# Patient Record
Sex: Female | Born: 1960 | Race: Black or African American | Hispanic: No | Marital: Married | Smoking: Former smoker
Health system: Southern US, Community
[De-identification: ages and names within clinical notes are randomized; demographics above are authoritative.]

## PROBLEM LIST (undated history)

## (undated) DIAGNOSIS — G2581 Restless legs syndrome: Secondary | ICD-10-CM

## (undated) DIAGNOSIS — E039 Hypothyroidism, unspecified: Secondary | ICD-10-CM

## (undated) DIAGNOSIS — R7301 Impaired fasting glucose: Secondary | ICD-10-CM

## (undated) DIAGNOSIS — D649 Anemia, unspecified: Secondary | ICD-10-CM

## (undated) DIAGNOSIS — M545 Low back pain, unspecified: Secondary | ICD-10-CM

## (undated) DIAGNOSIS — D869 Sarcoidosis, unspecified: Secondary | ICD-10-CM

## (undated) DIAGNOSIS — E669 Obesity, unspecified: Secondary | ICD-10-CM

## (undated) DIAGNOSIS — E559 Vitamin D deficiency, unspecified: Secondary | ICD-10-CM

## (undated) DIAGNOSIS — K219 Gastro-esophageal reflux disease without esophagitis: Secondary | ICD-10-CM

## (undated) DIAGNOSIS — I10 Essential (primary) hypertension: Secondary | ICD-10-CM

## (undated) DIAGNOSIS — J189 Pneumonia, unspecified organism: Secondary | ICD-10-CM

## (undated) DIAGNOSIS — E785 Hyperlipidemia, unspecified: Secondary | ICD-10-CM

## (undated) HISTORY — DX: Obesity, unspecified: E66.9

## (undated) HISTORY — DX: Essential (primary) hypertension: I10

## (undated) HISTORY — DX: Gastro-esophageal reflux disease without esophagitis: K21.9

## (undated) HISTORY — DX: Impaired fasting glucose: R73.01

## (undated) HISTORY — DX: Low back pain, unspecified: M54.50

## (undated) HISTORY — DX: Hyperlipidemia, unspecified: E78.5

## (undated) HISTORY — DX: Vitamin D deficiency, unspecified: E55.9

## (undated) HISTORY — DX: Sarcoidosis, unspecified: D86.9

## (undated) HISTORY — DX: Hypothyroidism, unspecified: E03.9

## (undated) HISTORY — DX: Anemia, unspecified: D64.9

## (undated) HISTORY — DX: Restless legs syndrome: G25.81

## (undated) HISTORY — DX: Low back pain: M54.5

---

## 2000-08-21 ENCOUNTER — Encounter: Admission: RE | Admit: 2000-08-21 | Discharge: 2000-08-21 | Payer: Self-pay | Admitting: Emergency Medicine

## 2000-08-21 ENCOUNTER — Encounter: Payer: Self-pay | Admitting: Emergency Medicine

## 2001-07-05 ENCOUNTER — Encounter: Admission: RE | Admit: 2001-07-05 | Discharge: 2001-07-05 | Payer: Self-pay | Admitting: Emergency Medicine

## 2001-07-05 ENCOUNTER — Encounter: Payer: Self-pay | Admitting: Emergency Medicine

## 2001-09-10 ENCOUNTER — Emergency Department (HOSPITAL_COMMUNITY): Admission: EM | Admit: 2001-09-10 | Discharge: 2001-09-10 | Payer: Self-pay | Admitting: Emergency Medicine

## 2001-09-21 ENCOUNTER — Encounter: Payer: Self-pay | Admitting: Emergency Medicine

## 2001-09-21 ENCOUNTER — Encounter: Admission: RE | Admit: 2001-09-21 | Discharge: 2001-09-21 | Payer: Self-pay | Admitting: Emergency Medicine

## 2002-10-14 ENCOUNTER — Encounter: Admission: RE | Admit: 2002-10-14 | Discharge: 2002-10-14 | Payer: Self-pay | Admitting: Emergency Medicine

## 2002-10-14 ENCOUNTER — Encounter: Payer: Self-pay | Admitting: Emergency Medicine

## 2003-11-23 ENCOUNTER — Encounter: Admission: RE | Admit: 2003-11-23 | Discharge: 2003-11-23 | Payer: Self-pay | Admitting: Emergency Medicine

## 2003-12-27 ENCOUNTER — Encounter: Admission: RE | Admit: 2003-12-27 | Discharge: 2003-12-27 | Payer: Self-pay | Admitting: Family Medicine

## 2004-12-27 ENCOUNTER — Encounter: Admission: RE | Admit: 2004-12-27 | Discharge: 2004-12-27 | Payer: Self-pay | Admitting: Family Medicine

## 2005-02-07 ENCOUNTER — Other Ambulatory Visit: Admission: RE | Admit: 2005-02-07 | Discharge: 2005-02-07 | Payer: Self-pay | Admitting: Family Medicine

## 2005-02-24 ENCOUNTER — Encounter: Admission: RE | Admit: 2005-02-24 | Discharge: 2005-02-24 | Payer: Self-pay | Admitting: Pediatrics

## 2005-03-20 HISTORY — PX: ABDOMINAL HYSTERECTOMY: SHX81

## 2005-03-20 HISTORY — PX: OTHER SURGICAL HISTORY: SHX169

## 2005-04-16 ENCOUNTER — Encounter (INDEPENDENT_AMBULATORY_CARE_PROVIDER_SITE_OTHER): Payer: Self-pay | Admitting: *Deleted

## 2005-04-16 ENCOUNTER — Observation Stay (HOSPITAL_COMMUNITY): Admission: RE | Admit: 2005-04-16 | Discharge: 2005-04-17 | Payer: Self-pay | Admitting: *Deleted

## 2005-04-30 ENCOUNTER — Ambulatory Visit (HOSPITAL_COMMUNITY): Admission: RE | Admit: 2005-04-30 | Discharge: 2005-04-30 | Payer: Self-pay | Admitting: *Deleted

## 2005-05-12 ENCOUNTER — Ambulatory Visit (HOSPITAL_COMMUNITY): Admission: RE | Admit: 2005-05-12 | Discharge: 2005-05-12 | Payer: Self-pay | Admitting: General Surgery

## 2005-05-23 ENCOUNTER — Encounter (INDEPENDENT_AMBULATORY_CARE_PROVIDER_SITE_OTHER): Payer: Self-pay | Admitting: Specialist

## 2005-05-23 ENCOUNTER — Ambulatory Visit (HOSPITAL_COMMUNITY): Admission: RE | Admit: 2005-05-23 | Discharge: 2005-05-23 | Payer: Self-pay | Admitting: General Surgery

## 2005-09-29 ENCOUNTER — Encounter: Admission: RE | Admit: 2005-09-29 | Discharge: 2005-09-29 | Payer: Self-pay | Admitting: Family Medicine

## 2005-10-06 ENCOUNTER — Encounter: Admission: RE | Admit: 2005-10-06 | Discharge: 2005-10-06 | Payer: Self-pay | Admitting: Internal Medicine

## 2005-11-18 ENCOUNTER — Ambulatory Visit: Payer: Self-pay | Admitting: Internal Medicine

## 2006-01-01 ENCOUNTER — Ambulatory Visit: Payer: Self-pay | Admitting: Internal Medicine

## 2006-02-09 ENCOUNTER — Encounter: Admission: RE | Admit: 2006-02-09 | Discharge: 2006-02-09 | Payer: Self-pay | Admitting: Family Medicine

## 2006-03-30 ENCOUNTER — Ambulatory Visit: Payer: Self-pay | Admitting: Internal Medicine

## 2006-04-27 ENCOUNTER — Ambulatory Visit: Payer: Self-pay | Admitting: Family Medicine

## 2006-05-08 ENCOUNTER — Emergency Department (HOSPITAL_COMMUNITY): Admission: EM | Admit: 2006-05-08 | Discharge: 2006-05-08 | Payer: Self-pay | Admitting: Emergency Medicine

## 2006-05-11 ENCOUNTER — Ambulatory Visit: Payer: Self-pay | Admitting: Internal Medicine

## 2006-05-25 ENCOUNTER — Ambulatory Visit: Payer: Self-pay | Admitting: Internal Medicine

## 2006-05-26 ENCOUNTER — Ambulatory Visit: Payer: Self-pay | Admitting: Family Medicine

## 2006-05-27 ENCOUNTER — Encounter: Admission: RE | Admit: 2006-05-27 | Discharge: 2006-05-27 | Payer: Self-pay | Admitting: Family Medicine

## 2006-05-28 ENCOUNTER — Ambulatory Visit: Payer: Self-pay | Admitting: Family Medicine

## 2006-06-08 ENCOUNTER — Encounter: Admission: RE | Admit: 2006-06-08 | Discharge: 2006-06-08 | Payer: Self-pay | Admitting: Gastroenterology

## 2006-06-29 ENCOUNTER — Ambulatory Visit (HOSPITAL_COMMUNITY): Admission: RE | Admit: 2006-06-29 | Discharge: 2006-06-29 | Payer: Self-pay | Admitting: Gastroenterology

## 2006-07-06 ENCOUNTER — Ambulatory Visit: Payer: Self-pay | Admitting: Pulmonary Disease

## 2006-08-28 ENCOUNTER — Ambulatory Visit: Payer: Self-pay | Admitting: Family Medicine

## 2006-09-14 ENCOUNTER — Ambulatory Visit: Payer: Self-pay | Admitting: Family Medicine

## 2006-10-29 ENCOUNTER — Ambulatory Visit: Payer: Self-pay | Admitting: Family Medicine

## 2006-11-02 ENCOUNTER — Ambulatory Visit: Payer: Self-pay | Admitting: Internal Medicine

## 2006-12-07 ENCOUNTER — Ambulatory Visit: Payer: Self-pay | Admitting: Internal Medicine

## 2006-12-07 LAB — CONVERTED CEMR LAB: Angiotensin 1 Converting Enzyme: 122 units/L — ABNORMAL HIGH (ref 9–67)

## 2006-12-30 ENCOUNTER — Ambulatory Visit: Payer: Self-pay | Admitting: Family Medicine

## 2007-01-04 ENCOUNTER — Encounter: Admission: RE | Admit: 2007-01-04 | Discharge: 2007-01-04 | Payer: Self-pay | Admitting: Family Medicine

## 2007-01-11 ENCOUNTER — Ambulatory Visit: Payer: Self-pay | Admitting: Internal Medicine

## 2007-01-28 ENCOUNTER — Ambulatory Visit: Payer: Self-pay | Admitting: Family Medicine

## 2007-07-12 ENCOUNTER — Ambulatory Visit: Payer: Self-pay | Admitting: Family Medicine

## 2007-08-30 ENCOUNTER — Ambulatory Visit: Payer: Self-pay | Admitting: Internal Medicine

## 2007-08-30 LAB — CONVERTED CEMR LAB
Angiotensin 1 Converting Enzyme: 189 units/L — ABNORMAL HIGH (ref 9–67)
BUN: 13 mg/dL (ref 6–23)
Basophils Relative: 0.1 % (ref 0.0–1.0)
CO2: 27 meq/L (ref 19–32)
Calcium: 8.9 mg/dL (ref 8.4–10.5)
Chloride: 110 meq/L (ref 96–112)
Eosinophils Absolute: 0.3 10*3/uL (ref 0.0–0.6)
Eosinophils Relative: 3.8 % (ref 0.0–5.0)
GFR calc non Af Amer: 82 mL/min
Glucose, Bld: 93 mg/dL (ref 70–99)
HCT: 39.5 % (ref 36.0–46.0)
Hemoglobin: 13.4 g/dL (ref 12.0–15.0)
Lymphocytes Relative: 22.7 % (ref 12.0–46.0)
MCV: 86.2 fL (ref 78.0–100.0)
Neutro Abs: 5 10*3/uL (ref 1.4–7.7)
Neutrophils Relative %: 69.1 % (ref 43.0–77.0)
Potassium: 4.3 meq/L (ref 3.5–5.1)
Sed Rate: 15 mm/hr (ref 0–25)
WBC: 7.2 10*3/uL (ref 4.5–10.5)

## 2007-09-07 DIAGNOSIS — K219 Gastro-esophageal reflux disease without esophagitis: Secondary | ICD-10-CM | POA: Insufficient documentation

## 2007-09-07 DIAGNOSIS — I10 Essential (primary) hypertension: Secondary | ICD-10-CM | POA: Insufficient documentation

## 2007-09-07 DIAGNOSIS — E039 Hypothyroidism, unspecified: Secondary | ICD-10-CM | POA: Insufficient documentation

## 2007-09-07 DIAGNOSIS — Z862 Personal history of diseases of the blood and blood-forming organs and certain disorders involving the immune mechanism: Secondary | ICD-10-CM | POA: Insufficient documentation

## 2007-09-13 ENCOUNTER — Ambulatory Visit: Payer: Self-pay | Admitting: Family Medicine

## 2007-09-13 ENCOUNTER — Ambulatory Visit: Payer: Self-pay | Admitting: Internal Medicine

## 2007-10-04 ENCOUNTER — Telehealth (INDEPENDENT_AMBULATORY_CARE_PROVIDER_SITE_OTHER): Payer: Self-pay | Admitting: *Deleted

## 2007-11-04 ENCOUNTER — Ambulatory Visit: Payer: Self-pay | Admitting: Family Medicine

## 2007-11-08 ENCOUNTER — Ambulatory Visit: Payer: Self-pay | Admitting: Internal Medicine

## 2008-02-03 ENCOUNTER — Ambulatory Visit: Payer: Self-pay | Admitting: Internal Medicine

## 2008-02-03 DIAGNOSIS — E669 Obesity, unspecified: Secondary | ICD-10-CM | POA: Insufficient documentation

## 2008-02-06 LAB — CONVERTED CEMR LAB: Angiotensin 1 Converting Enzyme: 9 units/L (ref 9–67)

## 2008-02-09 LAB — CONVERTED CEMR LAB: TSH: 0.57 microintl units/mL (ref 0.35–5.50)

## 2008-03-07 ENCOUNTER — Ambulatory Visit: Payer: Self-pay | Admitting: Internal Medicine

## 2008-03-14 ENCOUNTER — Telehealth (INDEPENDENT_AMBULATORY_CARE_PROVIDER_SITE_OTHER): Payer: Self-pay | Admitting: *Deleted

## 2008-03-15 ENCOUNTER — Encounter: Admission: RE | Admit: 2008-03-15 | Discharge: 2008-03-15 | Payer: Self-pay | Admitting: Family Medicine

## 2008-03-15 ENCOUNTER — Ambulatory Visit: Payer: Self-pay | Admitting: Family Medicine

## 2008-03-28 ENCOUNTER — Telehealth (INDEPENDENT_AMBULATORY_CARE_PROVIDER_SITE_OTHER): Payer: Self-pay | Admitting: *Deleted

## 2008-03-29 ENCOUNTER — Ambulatory Visit: Payer: Self-pay | Admitting: Internal Medicine

## 2008-03-30 ENCOUNTER — Telehealth (INDEPENDENT_AMBULATORY_CARE_PROVIDER_SITE_OTHER): Payer: Self-pay | Admitting: *Deleted

## 2008-04-10 ENCOUNTER — Telehealth (INDEPENDENT_AMBULATORY_CARE_PROVIDER_SITE_OTHER): Payer: Self-pay | Admitting: *Deleted

## 2008-05-09 ENCOUNTER — Ambulatory Visit: Payer: Self-pay | Admitting: Family Medicine

## 2008-06-16 ENCOUNTER — Encounter: Payer: Self-pay | Admitting: Internal Medicine

## 2008-06-16 ENCOUNTER — Telehealth (INDEPENDENT_AMBULATORY_CARE_PROVIDER_SITE_OTHER): Payer: Self-pay | Admitting: *Deleted

## 2008-06-23 ENCOUNTER — Ambulatory Visit (HOSPITAL_BASED_OUTPATIENT_CLINIC_OR_DEPARTMENT_OTHER): Admission: RE | Admit: 2008-06-23 | Discharge: 2008-06-23 | Payer: Self-pay | Admitting: Family Medicine

## 2008-06-28 ENCOUNTER — Ambulatory Visit: Payer: Self-pay | Admitting: Internal Medicine

## 2008-07-04 ENCOUNTER — Ambulatory Visit: Payer: Self-pay | Admitting: Pulmonary Disease

## 2008-07-05 ENCOUNTER — Telehealth (INDEPENDENT_AMBULATORY_CARE_PROVIDER_SITE_OTHER): Payer: Self-pay | Admitting: *Deleted

## 2008-07-10 ENCOUNTER — Ambulatory Visit: Payer: Self-pay | Admitting: Family Medicine

## 2008-07-20 ENCOUNTER — Encounter: Admission: RE | Admit: 2008-07-20 | Discharge: 2008-07-20 | Payer: Self-pay | Admitting: Family Medicine

## 2008-08-14 ENCOUNTER — Ambulatory Visit: Payer: Self-pay | Admitting: Internal Medicine

## 2008-09-19 ENCOUNTER — Ambulatory Visit: Payer: Self-pay | Admitting: Family Medicine

## 2008-09-19 ENCOUNTER — Encounter: Admission: RE | Admit: 2008-09-19 | Discharge: 2008-09-19 | Payer: Self-pay | Admitting: Family Medicine

## 2008-09-25 ENCOUNTER — Ambulatory Visit: Payer: Self-pay | Admitting: Internal Medicine

## 2008-09-25 DIAGNOSIS — J069 Acute upper respiratory infection, unspecified: Secondary | ICD-10-CM | POA: Insufficient documentation

## 2008-09-28 ENCOUNTER — Ambulatory Visit: Payer: Self-pay | Admitting: Family Medicine

## 2008-10-09 ENCOUNTER — Ambulatory Visit: Payer: Self-pay | Admitting: Internal Medicine

## 2008-12-08 ENCOUNTER — Telehealth (INDEPENDENT_AMBULATORY_CARE_PROVIDER_SITE_OTHER): Payer: Self-pay | Admitting: *Deleted

## 2009-01-04 ENCOUNTER — Ambulatory Visit: Payer: Self-pay | Admitting: Family Medicine

## 2009-01-04 ENCOUNTER — Other Ambulatory Visit: Admission: RE | Admit: 2009-01-04 | Discharge: 2009-01-04 | Payer: Self-pay | Admitting: Family Medicine

## 2009-01-16 ENCOUNTER — Ambulatory Visit: Payer: Self-pay | Admitting: Internal Medicine

## 2009-01-16 DIAGNOSIS — J45909 Unspecified asthma, uncomplicated: Secondary | ICD-10-CM | POA: Insufficient documentation

## 2009-01-19 ENCOUNTER — Emergency Department (HOSPITAL_COMMUNITY): Admission: EM | Admit: 2009-01-19 | Discharge: 2009-01-19 | Payer: Self-pay | Admitting: Emergency Medicine

## 2009-01-22 ENCOUNTER — Telehealth (INDEPENDENT_AMBULATORY_CARE_PROVIDER_SITE_OTHER): Payer: Self-pay | Admitting: *Deleted

## 2009-01-23 ENCOUNTER — Ambulatory Visit: Payer: Self-pay | Admitting: Internal Medicine

## 2009-01-25 ENCOUNTER — Telehealth (INDEPENDENT_AMBULATORY_CARE_PROVIDER_SITE_OTHER): Payer: Self-pay | Admitting: *Deleted

## 2009-01-25 DIAGNOSIS — M25519 Pain in unspecified shoulder: Secondary | ICD-10-CM | POA: Insufficient documentation

## 2009-03-06 ENCOUNTER — Ambulatory Visit: Payer: Self-pay | Admitting: Pulmonary Disease

## 2009-03-09 ENCOUNTER — Ambulatory Visit: Payer: Self-pay | Admitting: Family Medicine

## 2009-03-09 ENCOUNTER — Encounter: Admission: RE | Admit: 2009-03-09 | Discharge: 2009-03-09 | Payer: Self-pay | Admitting: Family Medicine

## 2009-05-09 ENCOUNTER — Ambulatory Visit: Payer: Self-pay | Admitting: Internal Medicine

## 2009-07-10 ENCOUNTER — Ambulatory Visit: Payer: Self-pay | Admitting: Family Medicine

## 2009-07-23 ENCOUNTER — Encounter: Admission: RE | Admit: 2009-07-23 | Discharge: 2009-07-23 | Payer: Self-pay | Admitting: Family Medicine

## 2009-08-01 ENCOUNTER — Ambulatory Visit: Payer: Self-pay | Admitting: Family Medicine

## 2009-08-08 ENCOUNTER — Ambulatory Visit: Payer: Self-pay | Admitting: Internal Medicine

## 2009-08-20 ENCOUNTER — Telehealth (INDEPENDENT_AMBULATORY_CARE_PROVIDER_SITE_OTHER): Payer: Self-pay | Admitting: *Deleted

## 2009-09-21 ENCOUNTER — Ambulatory Visit: Payer: Self-pay | Admitting: Internal Medicine

## 2009-11-30 ENCOUNTER — Telehealth: Payer: Self-pay | Admitting: Internal Medicine

## 2009-12-27 ENCOUNTER — Ambulatory Visit: Payer: Self-pay | Admitting: Internal Medicine

## 2010-01-01 ENCOUNTER — Ambulatory Visit: Payer: Self-pay | Admitting: Family Medicine

## 2010-01-29 ENCOUNTER — Telehealth: Payer: Self-pay | Admitting: Internal Medicine

## 2010-02-07 ENCOUNTER — Ambulatory Visit: Payer: Self-pay | Admitting: Internal Medicine

## 2010-02-27 ENCOUNTER — Ambulatory Visit: Payer: Self-pay | Admitting: Family Medicine

## 2010-03-27 ENCOUNTER — Ambulatory Visit: Payer: Self-pay | Admitting: Internal Medicine

## 2010-04-23 ENCOUNTER — Telehealth (INDEPENDENT_AMBULATORY_CARE_PROVIDER_SITE_OTHER): Payer: Self-pay | Admitting: *Deleted

## 2010-05-08 ENCOUNTER — Ambulatory Visit: Payer: Self-pay | Admitting: Internal Medicine

## 2010-05-13 ENCOUNTER — Other Ambulatory Visit: Admission: RE | Admit: 2010-05-13 | Discharge: 2010-05-13 | Payer: Self-pay | Admitting: Family Medicine

## 2010-05-13 ENCOUNTER — Ambulatory Visit: Payer: Self-pay | Admitting: Family Medicine

## 2010-05-13 LAB — HM PAP SMEAR

## 2010-05-27 ENCOUNTER — Telehealth (INDEPENDENT_AMBULATORY_CARE_PROVIDER_SITE_OTHER): Payer: Self-pay | Admitting: *Deleted

## 2010-06-28 ENCOUNTER — Ambulatory Visit: Payer: Self-pay | Admitting: Family Medicine

## 2010-07-04 ENCOUNTER — Ambulatory Visit: Payer: Self-pay | Admitting: Internal Medicine

## 2010-08-07 ENCOUNTER — Encounter: Admission: RE | Admit: 2010-08-07 | Discharge: 2010-08-07 | Payer: Self-pay | Admitting: Family Medicine

## 2010-08-07 LAB — HM MAMMOGRAPHY

## 2010-09-06 ENCOUNTER — Ambulatory Visit: Payer: Self-pay | Admitting: Internal Medicine

## 2010-09-06 ENCOUNTER — Encounter: Payer: Self-pay | Admitting: Internal Medicine

## 2010-10-03 ENCOUNTER — Ambulatory Visit: Payer: Self-pay | Admitting: Internal Medicine

## 2010-11-10 ENCOUNTER — Encounter: Payer: Self-pay | Admitting: Family Medicine

## 2010-11-19 ENCOUNTER — Ambulatory Visit: Admit: 2010-11-19 | Payer: Self-pay | Admitting: Internal Medicine

## 2010-11-21 NOTE — Progress Notes (Signed)
Summary: samples  Phone Note Call from Patient Call back at [phone]   Caller: Patient Call For: wert Summary of Call: pt need samples of symbicort 160/4.5 and benicar Initial call taken by: Rickard Patience,  April 23, 2010 1:25 PM  Follow-up for Phone Call        no samples of benicar in the office.  1 sample of symbicort left up front for pt to pick up at her convenience.    called spoke with patient who states that her insurance will have changed on the 20th of this month.  pt aware of no benicar samples and 1 sample of symbicort. Follow-up by: Boone Master CNA/MA,  April 23, 2010 2:59 PM

## 2010-11-21 NOTE — Miscellaneous (Signed)
Summary: Orders Update//[id number]  Clinical Lists Changes  Orders: Added new Test order of T-2 View CXR (71020TC) - Signed

## 2010-11-21 NOTE — Assessment & Plan Note (Signed)
Summary: NP follow up - med calendar   Primary Provider/Referring Provider:  Susann Givens  CC:  est med calendar - pt did not bring any of her meds with her today.  no complaints..  History of Present Illness:  51 yobf quit smoking 6/06 with chronic sarcoidosis manifested mostly arthritic complaints (started maybe in retrospect in 2004)  prednisone -dependent since January 2008.  Each time she lowers the dose in the past she has noticed a flare of her arthritis an does not respond to maximum doses of ibuprofen.  ov 03/07/08  without flare of cough or arthritis decreased prednisone to 10 mg one half every other day then  developed w/in a week or two rash and right so increased  Prednisone to 10mg  2 a day until better then tapered to one half daily w/in week rash and bilteral ankle pain recurred then increaese to one daily since 06/20/08  August 14, 2008 ov  rash gone,  maintaining 07-24-09 with increase dry cough x 2 weeks worse at bedtime but doesn't wake her up, no doe, cp, fever, chills, ns or myalgias/rash, back up to 10 per day  September 25, 2008 ov tried 10-5 then cold > chest  and one week prior to ov increased to 10 /day until one day prior to ov when started on 10-5 again.  No significant arthritis flare, no fever or rash. sputum is slightly yellow with no dyspnea or pleuritic pain sweats or unintended weight loss -  reports some better after proaire for up to 4 hours in terms of cough.--Symbicort started and pred held at 10mg  until symptoms better.   October 09, 2008 --ov follow up and med review.  Feeling  better with less cough, dyspnea. Has returned to 07-24-09 on prednisone.    January 16, 2009 on 10 -5 - 10 taper to 5 mg daily  May 09, 2009 maintaining prednisone 10 mg one half daily no flare  rec try one half odd days  August 08, 2009 ov on one half odd days doing ok so no change  September 21, 2009 6 wk followup.  Pt c/o prod cough x 2 days- yellow sputum.  Also c/o tightness in chest  in the right side- radiates around her back.  Pain does not worsen with breathing. December 27, 2009 3 month follouwp.  Pt c/o dry cough x 1 wk.  She also c/o increased SOB that "comes and goes"- mainly with exertion.    February 07, 2010--Presents for follow up and med reivew.Since last visit she is doing okay. She has brought all her meds today and we reviewed them w/ pt education and organized /updated her med calendar. Denies chest pain, dyspnea, orthopnea, hemoptysis, fever, n/v/d, edema, headache.      Medications Prior to Update: 1)  Synthroid 75 Mcg  Tabs (Levothyroxine Sodium) .... Take One Tab By Mouth Once Daily 2)  Pantoprazole Sodium 40 Mg Tbec (Pantoprazole Sodium) .... Take 1 Tab By Mouth 30 Minutes Before The First Meal of The Day 3)  Lexapro 10 Mg  Tabs (Escitalopram Oxalate) .... Once Daily 4)  Benefiber   Powd (Wheat Dextrin) .Marland Kitchen.. 1 Tbsp. Once Daily 5)  Benicar Hct 20-12.5 Mg  Tabs (Olmesartan Medoxomil-Hctz) .... One Tablet By Mouth Daily 6)  Vitamin D 16109 Unit Caps (Ergocalciferol) .Marland Kitchen.. 1 Capsule By Mouth Once A Week 7)  Ferrous Sulfate 325 (65 Fe) Mg Tabs (Ferrous Sulfate) .... Take 1 Tablet By Mouth Once A Day 8)  Centrum Silver  Tabs (Multiple Vitamins-Minerals) .Marland Kitchen.. 1 Once Daily 9)  Calcium 600/vitamin D 600-400 Mg-Unit Tabs (Calcium Carbonate-Vitamin D) .Marland Kitchen.. 1 Once Daily 10)  Symbicort 80-4.5 Mcg/act  Aero (Budesonide-Formoterol Fumarate) .... Inhale 2 Puffs Two Times A Day 11)  Restasis 0.05 %  Emul (Cyclosporine) .Marland Kitchen.. 1 Drop Each Eye Twice Daily. 12)  Advil 200 Mg Tabs (Ibuprofen) .... Per Bottle 13)  Zolpidem Tartrate 10 Mg  Tabs (Zolpidem Tartrate) .... At Bedtime As Needed 14)  Fluticasone Propionate 50 Mcg/act Susp (Fluticasone Propionate) .... 2 Puffs Two Times A Day As Needed 15)  Proair Hfa 108 (90 Base) Mcg/act Aers (Albuterol Sulfate) .... 2 Puffs Every 4 Hours As Needed 16)  Promethazine-Codeine 6.25-10 Mg/49ml Syrp (Promethazine-Codeine) .Marland Kitchen.. 1-2 Teaspoons  Every 4-6 Hours As Needed 17)  Lovaza 1 Gm Caps (Omega-3-Acid Ethyl Esters) .... 2 Two Times A Day 18)  Prednisone 5 Mg Tabs (Prednisone) .... One Half Every Other Day 19)  Ropinirole Hcl 0.5 Mg Tabs (Ropinirole Hcl) .Marland Kitchen.. 1 At Bedtime  Allergies (verified): 1)  ! * Oxycodone 2)  Wellbutrin  Past History:  Past Medical History: Last updated: 12/27/2009 Hypertension Hypothyroidism OBESITY  aggravated by prednisone use  - TSH normal 02/03/2008   - target weight 190  - peak all time weight =  291 on 02/03/2008 SARCOIDOSIS (ICD-135) ...........................................................Marland KitchenWert   - symptom onset 2004   - steroid dep since 10/2006 (arthitis and rash)   - HFA 50 % January 16, 2009  >  90% May 09, 2009  GERD (ICD-530.81) HYPOTHYROIDISM (ICD-244.9) HYPERTENSION (ICD-401.9) COMPLEX MEDICAL REGIMEN    - Calendar requested 09/25/08,     --adjusted October 09, 2008  Family History: Last updated: 02/07/2010 clotting disorders - MGM rheumatism - MGM, mother cancer - mother (bladder)  Social History: Last updated: 02/07/2010 quit smoking June of 2006.  x47yrs 1/2ppd occ alcohol married 1 children currently unemployed - [employer] going to school for CMA  Risk Factors: Smoking Status: quit (06/28/2008)  Family History: clotting disorders - MGM rheumatism - MGM, mother cancer - mother (bladder)  Social History: quit smoking June of 2006.  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  no complaints. Is Patient Diabetic? No Comments Medications reviewed with patient Daytime contact number verified with patient. Boone Master CNA  February 07, 2010 10:10 AM    Physical Exam  Additional Exam:  wt  251 May 09, 2009  >  258 September 22, 2009 > 265 December 27, 2009 >>263 February 07, 2010 GEN: A/Ox3; pleasant , NAD HEENT:  [place]/AT, , EACs-clear, TMs-wnl, NOSE-clear, THROAT-clear NECK:  Supple w/ fair ROM; no JVD; normal carotid impulses w/o bruits; no thyromegaly or nodules palpated; no lymphadenopathy. RESP  Clear to P & A; w/o,  wheezes/ rales/ or rhonchi, diminished breath sounds in the bases bilaterally. CARD:  RRR, no m/r/g   GI:   Soft & nt; nml bowel sounds; no organomegaly or masses detected. Musco: Warm bil,  no calf tenderness, tr edema.  no clubbing, pulses intact,  Skin:  no rash   Impression & Recommendations:  Problem # 1:  SARCOIDOSIS (ICD-135) Compensated on present regimen Meds reviewed with pt education and computerized med calendar completed/adjusted.   follow up 6 weeks Dr. Sherene Sires   Complete Medication List: 1)  Synthroid 75 Mcg Tabs (Levothyroxine sodium) .... Take one tab by mouth once daily 2)  Pantoprazole Sodium 40 Mg Tbec (Pantoprazole sodium) .... Take 1 tab by mouth 30 minutes before the first meal of the day 3)  Lexapro 10 Mg Tabs (Escitalopram oxalate) .... Once daily 4)  Benefiber Powd (Wheat dextrin) .Marland Kitchen.. 1 tbsp. once daily 5)  Benicar Hct 20-12.5 Mg Tabs (Olmesartan medoxomil-hctz) .... One tablet by mouth daily 6)  Vitamin D 45409 Unit Caps (Ergocalciferol) .Marland Kitchen.. 1 capsule by mouth once a week 7)  Ferrous Sulfate 325 (65 Fe) Mg Tabs (Ferrous sulfate) .... Take 1 tablet by mouth once a day 8)  Centrum Silver Tabs (Multiple vitamins-minerals) .Marland Kitchen.. 1 once daily 9)  Calcium 600/vitamin D 600-400 Mg-unit Tabs (Calcium carbonate-vitamin d) .Marland Kitchen.. 1 once daily 10)  Symbicort 80-4.5 Mcg/act Aero (Budesonide-formoterol  fumarate) .... Inhale 2 puffs two times a day 11)  Restasis 0.05 % Emul (Cyclosporine) .Marland Kitchen.. 1 drop each eye twice daily. 12)  Advil 200 Mg Tabs (Ibuprofen) .... Per bottle 13)  Zolpidem Tartrate 10 Mg Tabs (Zolpidem tartrate) .... At bedtime as needed 14)  Fluticasone Propionate 50 Mcg/act Susp (Fluticasone propionate) .... 2 puffs two times a day as needed 15)  Proair Hfa 108 (90 Base) Mcg/act Aers (Albuterol sulfate) .... 2 puffs every 4 hours as needed 16)  Promethazine-codeine 6.25-10 Mg/66ml Syrp (Promethazine-codeine) .Marland Kitchen.. 1-2 teaspoons every 4-6 hours as needed 17)  Lovaza 1 Gm Caps (Omega-3-acid ethyl esters) .... 2 two times a day 18)  Prednisone 5 Mg Tabs (Prednisone) .... One half every other day 19)  Ropinirole Hcl 0.5 Mg Tabs (Ropinirole hcl) .Marland Kitchen.. 1 at bedtime  Other Orders: Est. Patient Level III (81191)  Patient Instructions: 1)  Continue on same meds.  2)  Follow med calendar closely and bring to each visit.  3)  follow up Dr. Sherene Sires in 6 weeks.   Appended Document: med calendar update    Clinical Lists Changes  Medications: Added new medication of MELOXICAM 15 MG TABS (MELOXICAM) Take 1 tablet by mouth once a day - Signed Added new medication of LYRICA 75 MG CAPS (PREGABALIN) Take 1 capsule by mouth two times a day - Signed Changed medication from PREDNISONE 5 MG TABS (PREDNISONE) one half every other day to PREDNISONE 10 MG TABS (PREDNISONE) Take 1/2 tablet by mouth every other day Changed medication from CALCIUM 600/VITAMIN D 600-400 MG-UNIT TABS (CALCIUM CARBONATE-VITAMIN D) 1 once daily to CALCIUM 600/VITAMIN D 600-400 MG-UNIT TABS (CALCIUM CARBONATE-VITAMIN D) Take 1 tablet by mouth two times a day Added new medication of AMRIX 15 MG XR24H-CAP (CYCLOBENZAPRINE HCL) Take 1 capsule by mouth once a day Changed medication from PROAIR HFA 108 (90 BASE) MCG/ACT AERS (ALBUTEROL SULFATE) 2 puffs every 4 hours as needed to PROAIR HFA 108 (90 BASE) MCG/ACT AERS (ALBUTEROL  SULFATE) 2 puffs every 3 hours as needed Added new medication of ZYRTEC ALLERGY 10 MG TABS (CETIRIZINE HCL) Take 1 tab  by mouth at bedtime as needed Removed medication of BENEFIBER   POWD (WHEAT DEXTRIN) 1 tbsp. once daily Removed medication of LOVAZA 1 GM CAPS (OMEGA-3-ACID ETHYL ESTERS) 2 two times a day Removed medication of ROPINIROLE HCL 0.5 MG TABS (ROPINIROLE HCL) 1 at bedtime Added new medication of EXCEDRIN EXTRA STRENGTH 250-250-65 MG TABS (ASPIRIN-ACETAMINOPHEN-CAFFEINE) per bottle Added new medication of MUCINEX DM 30-600 MG XR12H-TAB (DEXTROMETHORPHAN-GUAIFENESIN) Take 1-2 tablets every 12 hours as needed

## 2010-11-21 NOTE — Progress Notes (Signed)
Summary: copy of med list / ov instructions  Phone Note Call from Patient Call back at Home Phone [phone]   Caller: Patient Call For: wert Reason for Call: Talk to Nurse Summary of Call: Pt wants to speak to nurse about some papers she needs.  pt has misplaced med list and instruction sheet from last visit. Initial call taken by: Eugene Gavia,  May 27, 2010 9:28 AM  Follow-up for Phone Call        Patient has misplaced her medication sheet and would like to pick up another one. Tammy will you reprint med sheet for this patient? She will pick this up, along with, last OV instructions.Michel Bickers [facility]  May 27, 2010 10:43 AM  Additional Follow-up for Phone Call Additional follow up Details #1::        ok Additional Follow-up by: Rubye Oaks NP,  May 27, 2010 2:05 PM    Additional Follow-up for Phone Call Additional follow up Details #2::    med calendar printed by TP and last OV patient instructions printed per patient's request.  called spoke with patient, she requested these be mailed to her at a [address] address that has been added to her chart.  placed in mail.  pt is aware. Follow-up by: Boone Master CNA/MA,  May 27, 2010 2:14 PM

## 2010-11-21 NOTE — Miscellaneous (Signed)
Summary: Orders Update pft charges  Clinical Lists Changes  Orders: Added new Service order of Carbon Monoxide diffusing w/capacity (94720) - Signed Added new Service order of Lung Volumes (94240) - Signed Added new Service order of Spirometry (Pre & Post) (94060) - Signed 

## 2010-11-21 NOTE — Assessment & Plan Note (Signed)
Summary: Pulmonary/ f/u sarcoidosis   Primary Provider/Referring Provider:  Susann Givens  CC:  3 month follouwp.  Pt c/o dry cough x 1 wk.  She also c/o increased SOB that "comes and goes"- mainly with exertion.Lisa [name]  History of Present Illness:  50 yobf quit smoking 6/06 with chronic sarcoidosis manifested mostly arthritic complaints (started maybe in retrospect in 2004)  prednisone -dependent since January 2008.  Each time she lowers the dose in the past she has noticed a flare of her arthritis an does not respond to maximum doses of ibuprofen.  ov 03/07/08  without flare of cough or arthritis decreased prednisone to 10 mg one half every other day then  developed w/in a week or two rash and right so increased  Prednisone to 10mg  2 a day until better then tapered to one half daily w/in week rash and bilteral ankle pain recurred then increaese to one daily since 06/20/08  August 14, 2008 ov  rash gone,  maintaining 07-24-09 with increase dry cough x 2 weeks worse at bedtime but doesn't wake her up, no doe, cp, fever, chills, ns or myalgias/rash, back up to 10 per day  September 25, 2008 ov tried 10-5 then cold > chest  and one week prior to ov increased to 10 /day until one day prior to ov when started on 10-5 again.  No significant arthritis flare, no fever or rash. sputum is slightly yellow with no dyspnea or pleuritic pain sweats or unintended weight loss -  reports some better after proaire for up to 4 hours in terms of cough.--Symbicort started and pred held at 10mg  until symptoms better.   October 09, 2008 --ov follow up and med review.  Feeling  better with less cough, dyspnea. Has returned to 07-24-09 on prednisone.    January 16, 2009 on 10 -5 - 10 taper to 5 mg daily  May 09, 2009 maintaining prednisone 10 mg one half daily no flare  rec try one half odd days  August 08, 2009 ov on one half odd days doing ok so no change  September 21, 2009 6 wk followup.  Pt c/o prod cough x 2 days- yellow  sputum.  Also c/o tightness in chest in the right side- radiates around her back.  Pain does not worsen with breathing. December 27, 2009 51 month follouwp.  Pt c/o dry cough x 1 wk.  She also c/o increased SOB that "comes and goes"- mainly with exertion. Pt denies any significant sore throat, dysphagia, itching, sneezing,  nasal congestion or excess secretions,  fever, chills, sweats, unintended wt loss, pleuritic or exertional cp, hempoptysis, change in activity tolerance  orthopnea pnd or leg swelling Pt also denies any obvious fluctuation in symptoms with weather or environmental change or other alleviating or aggravating factors.       Current Medications (verified): 1)  Synthroid 75 Mcg  Tabs (Levothyroxine Sodium) .... Take One Tab By Mouth Once Daily 2)  Pantoprazole Sodium 40 Mg Tbec (Pantoprazole Sodium) .... Take 1 Tab By Mouth 30 Minutes Before The First Meal of The Day 3)  Lexapro 10 Mg  Tabs (Escitalopram Oxalate) .... Once Daily 4)  Benefiber   Powd (Wheat Dextrin) .Lisa [name].. 1 Tbsp. Once Daily 5)  Benicar Hct 20-12.5 Mg  Tabs (Olmesartan Medoxomil-Hctz) .... One Tablet By Mouth Daily 6)  Vitamin D 14782 Unit Caps (Ergocalciferol) .Lisa [name].. 1 Capsule By Mouth Once A Week 7)  Ferrous Sulfate 325 (65 Fe) Mg Tabs (Ferrous Sulfate) .... Take  1 Tablet By Mouth Once A Day 8)  Centrum Silver  Tabs (Multiple Vitamins-Minerals) .Lisa [name].. 1 Once Daily 9)  Calcium 600/vitamin D 600-400 Mg-Unit Tabs (Calcium Carbonate-Vitamin D) .Lisa [name].. 1 Once Daily 10)  Symbicort 80-4.5 Mcg/act  Aero (Budesonide-Formoterol Fumarate) .... Inhale 2 Puffs Two Times A Day 11)  Restasis 0.05 %  Emul (Cyclosporine) .Lisa [name].. 1 Drop Each Eye Twice Daily. 12)  Advil 200 Mg Tabs (Ibuprofen) .... Per Bottle 13)  Zolpidem Tartrate 10 Mg  Tabs (Zolpidem Tartrate) .... At Bedtime As Needed 14)  Fluticasone Propionate 50 Mcg/act Susp (Fluticasone Propionate) .... 2 Puffs Two Times A Day As Needed 15)  Proair Hfa 108 (90 Base) Mcg/act Aers (Albuterol  Sulfate) .... 2 Puffs Every 4 Hours As Needed 16)  Promethazine-Codeine 6.25-10 Mg/74ml Syrp (Promethazine-Codeine) .Lisa [name].. 1-2 Teaspoons Every 4-6 Hours As Needed 17)  Lovaza 1 Gm Caps (Omega-3-Acid Ethyl Esters) .... 2 Two Times A Day 18)  Prednisone 5 Mg Tabs (Prednisone) .... One Half Every Other Day 19)  Ropinirole Hcl 0.5 Mg Tabs (Ropinirole Hcl) .Lisa [name].. 1 At Bedtime  Allergies (verified): 1)  ! * Oxycodone 2)  Wellbutrin  Past History:  Past Medical History: Hypertension Hypothyroidism OBESITY  aggravated by prednisone use  - TSH normal 02/03/2008   - target weight 190  - peak all time weight =  291 on 02/03/2008 SARCOIDOSIS (ICD-135) ...........................................................Lisa KitchenWert   - symptom onset 2004   - steroid dep since 10/2006 (arthitis and rash)   - HFA 50 % January 16, 2009  >  90% May 09, 2009  GERD (ICD-530.81) HYPOTHYROIDISM (ICD-244.9) HYPERTENSION (ICD-401.9) COMPLEX MEDICAL REGIMEN    - Calendar requested 09/25/08,     --adjusted October 09, 2008  Vital Signs:  Patient profile:   50 year old female Weight:      265 pounds O2 Sat:      97 % on Room air Temp:     97.9 degrees F oral Pulse rate:   80 / minute BP sitting:   100 / 64  (left arm) Cuff size:   large  Vitals Entered By: Vernie Murders (December 27, 2009 11:17 AM)  O2 Flow:  Room air  Physical Exam  Additional Exam:  wt  251 May 09, 2009  >  258 September 22, 2009 > 265 December 27, 2009  GEN: A/Ox3; pleasant , NAD HEENT:  [place]/AT, , EACs-clear, TMs-wnl, NOSE-clear, THROAT-clear NECK:  Supple w/ fair ROM; no JVD; normal carotid impulses w/o bruits; no thyromegaly or nodules palpated; no lymphadenopathy. RESP  Clear to P & A; w/o, wheezes/ rales/ or rhonchi, diminished breath sounds in the bases bilaterally. CARD:  RRR, no m/r/g   GI:   Soft & nt; nml bowel sounds; no organomegaly or masses detected. Musco: Warm bil,  no calf tenderness, tr edema.  no clubbing, pulses intact,  Skin:  no  rash   Impression & Recommendations:  Problem # 1:  SARCOIDOSIS (ICD-135) .  The goal with a chronic steroid dependent illness is always arriving at the lowest effective dose that controls the disease/symptoms and not accepting a set "formula" which is based on statistics that don't take into accound individual variability or the natural hx of the dz in every individual patient, which may well vary over time. Keep working on lowering the floor,looking for signs of flare or adrenal insufficiency.  Each maintenance medication was reviewed in detail including most importantly the difference between maintenance and as needed and under what circumstances the prns are  to be used.   This was done in the context of a medication calendar review which provided the patient with a user-friendly unambiguous mechanism for medication administration and reconciliation and provides an action plan for all active problems. It is critical that this be shown to every doctor  for modification during the office visit if necessary so the patient can use it as a working document.   Problem # 2:  GERD (ICD-530.81)  Her updated medication list for this problem includes:    Pantoprazole Sodium 40 Mg Tbec (Pantoprazole sodium) .Lisa [name]... Take 1 tab by mouth 30 minutes before the first meal of the day  See concerns re use of fish oil or for that matter any oil based vitamins  Orders: Est. Patient Level III (14782)  Patient Instructions: 1)  See Tammy NP w/in 6 weeks with all your medications, even over the counter meds, separated in two separate bags, the ones you take no matter what vs the ones you stop once you feel better and take only as needed.  She will generate for you a new user friendly medication calendar that will put [place] all on the same page re: your medication use.  2)  Lovaza use in patients with tendency to reflux can cause cough congestion wheezing and shortness of breath.

## 2010-11-21 NOTE — Progress Notes (Signed)
Summary: rx  Phone Note Call from Patient Call back at Home Phone [phone]   Caller: Patient Call For: Arrin Ishler Reason for Call: Refill Medication, Talk to Nurse Summary of Call: need refill on her prednisone. [facility] Initial call taken by: Eugene Gavia,  November 30, 2009 11:47 AM  Follow-up for Phone Call        pt states is out of samples and does not have enought to last to follow up appointment with MW. I left pts samples @ front desk and sent Prednisone rx to pharmacy electronic.    New/Updated Medications: BENICAR HCT 20-12.5 MG  TABS (OLMESARTAN MEDOXOMIL-HCTZ) One tablet by mouth daily SYMBICORT 80-4.5 MCG/ACT  AERO (BUDESONIDE-FORMOTEROL FUMARATE) Inhale 2 puffs two times a day Prescriptions: BENICAR HCT 20-12.5 MG  TABS (OLMESARTAN MEDOXOMIL-HCTZ) One tablet by mouth daily  #4 x 0   Entered by:   Zackery Barefoot CMA   Authorized by:   Nyoka Cowden MD   Signed by:   Zackery Barefoot CMA on 11/30/2009   Method used:   Samples Given   RxID:   [id number] SYMBICORT 80-4.5 MCG/ACT  AERO (BUDESONIDE-FORMOTEROL FUMARATE) Inhale 2 puffs two times a day  #2 boxes x 0   Entered by:   Zackery Barefoot CMA   Authorized by:   Nyoka Cowden MD   Signed by:   Zackery Barefoot CMA on 11/30/2009   Method used:   Samples Given   RxID:   [id number] PREDNISONE 5 MG TABS (PREDNISONE) one half every other day  #100 x 0   Entered by:   Zackery Barefoot CMA   Authorized by:   Nyoka Cowden MD   Signed by:   Zackery Barefoot CMA on 11/30/2009   Method used:   [facility]        [facility] [address] [phone]* (retail)       [address]       [place], [place]  [address]       Ph: [phone] or [phone]       Fax: [phone]   RxID:   [id number]

## 2010-11-21 NOTE — Assessment & Plan Note (Signed)
Summary: Pulmonary/ ext ov     Primary Provider/Referring Provider:  Susann Givens  CC:  Cough- worse.  History of Present Illness: 50  yobf quit smoking 6/06 with chronic sarcoidosis manifested mostly arthritic complaints (started maybe in retrospect in 2004)  prednisone -dependent since January 2008.  Each time she lowers the dose in the past she has noticed a flare of her arthritis an does not respond to maximum doses of ibuprofen.  ov 03/07/08  without flare of cough or arthritis decreased prednisone to 10 mg one half every other day then  developed w/in a week or two rash and right so increased  Prednisone to 10mg  2 a day until better then tapered to one half daily w/in week rash and bilteral ankle pain recurred then increaese to one daily since 06/20/08  August 14, 2008 ov  rash gone,  maintaining 07-24-09 with increase dry cough x 2 weeks worse at bedtime but doesn't wake her up, no doe, cp, fever, chills, ns or myalgias/rash, back up to 10 per day  September 25, 2008 ov tried 10-5 then cold > chest  and one week prior to ov increased to 10 /day until one day prior to ov when started on 10-5 again.  No significant arthritis flare, no fever or rash. sputum is slightly yellow with no dyspnea or pleuritic pain sweats or unintended weight loss -  reports some better after proaire for up to 4 hours in terms of cough.--Symbicort started and pred held at 10mg  until symptoms better.   October 09, 2008 --ov follow up and med review.  Feeling  better with less cough, dyspnea. Has returned to 07-24-09 on prednisone.    January 16, 2009 on 10 -5 - 10 taper to 5 mg daily  May 09, 2009 maintaining prednisone 10 mg one half daily no flare  rec try one half odd days  August 08, 2009 ov on one half odd days doing ok so no change  see page 2  February 07, 2010--Presents for follow up and med reivew.Since last visit she is doing okay. She has brought all her meds today and we reviewed them w/ pt education and  organized /updated her med calendar.   March 27, 2010 6 wk followup.  Pt c/o cough x 3 wks.  She states that it only happens at night when lies down.  She states that cough is dry.  She states that she has been postponing her pm dose of the symbicort 80 for when the cough occurs and this seems to help. no sob. arthritis ok on 5 mg  every other day dosing. no ocular c/o or rash.  rec try taper to q 3 d dosing and increase symbicort to 160 2bid  May 08, 2010 ov 6 wk followup.  Pt states that her cough is better.  Still has some cough in the am- prod with thick yellow sputum.  She states had some itchy rash on her left leg a wk ago but this has resolved. actually on 2.5 mg q3d with no complaints on the days she misses pred dosing. >>Prednisone stopped.   July 04, 2010--Presents for est med calendar - pt brought all meds with her today.  no new complaints. Last visit weaned off steroids. No flare since last vist. We reviewed her meds and updated her med calendar.  . Got her flu shot yesterday.  no change rx  October 04, 2010 ov  no chang cc doe but hacky dry cough worse,  feels like throat is irritated ? from symbicort esp in am. Pt denies any significant sore throat, dysphagia, itching, sneezing,  nasal congestion or excess secretions,  fever, chills, sweats, unintended wt loss, pleuritic or exertional cp, hempoptysis, variability  in activity tolerance  orthopnea pnd or leg swelling. Pt also denies any obvious fluctuation in symptoms with weather or environmental change or other alleviating or aggravating factors.       Current Medications (verified): 1)  Synthroid 75 Mcg  Tabs (Levothyroxine Sodium) .... Take One Tab By Mouth Once Daily 2)  Meloxicam 15 Mg Tabs (Meloxicam) .... Take 1 Tablet By Mouth Once A Day 3)  Pantoprazole Sodium 40 Mg Tbec (Pantoprazole Sodium) .... Take 1 Tab By Mouth 30 Minutes Before The First Meal of The Day 4)  Pravastatin Sodium 20 Mg Tabs (Pravastatin Sodium) ....  Take 1 Tab By Mouth At Bedtime 5)  Lexapro 10 Mg  Tabs (Escitalopram Oxalate) .... Once Daily 6)  Lyrica 75 Mg Caps (Pregabalin) .... Take 1 Capsule By Mouth Two Times A Day 7)  Benicar Hct 20-12.5 Mg  Tabs (Olmesartan Medoxomil-Hctz) .... One Tablet By Mouth Daily 8)  Vitamin D3 1000 Unit Tabs (Cholecalciferol) .Marland Kitchen.. 1 Once Daily 9)  Ferrous Sulfate 325 (65 Fe) Mg Tabs (Ferrous Sulfate) .... Take 1 Tablet By Mouth Once A Day 10)  Centrum Silver  Tabs (Multiple Vitamins-Minerals) .Marland Kitchen.. 1 Once Daily 11)  Calcium 600/vitamin D 600-400 Mg-Unit Tabs (Calcium Carbonate-Vitamin D) .... Take 1 Tablet By Mouth Two Times A Day 12)  Symbicort 160-4.5 Mcg/act Aero (Budesonide-Formoterol Fumarate) .... 2 Puffs Two Times A Day 13)  Restasis 0.05 %  Emul (Cyclosporine) .Marland Kitchen.. 1 Drop Each Eye Twice Daily. 14)  Amrix 15 Mg Xr24h-Cap (Cyclobenzaprine Hcl) .... Take 1 Capsule By Mouth Once A Day 15)  Ropinirole Hcl 0.5 Mg Tabs (Ropinirole Hcl) .... 1/2 - 2 Tabs By Mouth At Bedtime 16)  Metformin Hcl 500 Mg Tabs (Metformin Hcl) .Marland Kitchen.. 1 Every Evening With Dinner 17)  Advil 200 Mg Tabs (Ibuprofen) .... Per Bottle 18)  Zolpidem Tartrate 10 Mg  Tabs (Zolpidem Tartrate) .... At Bedtime As Needed 19)  Fluticasone Propionate 50 Mcg/act Susp (Fluticasone Propionate) .... 2 Puffs Two Times A Day As Needed 20)  Proair Hfa 108 (90 Base) Mcg/act Aers (Albuterol Sulfate) .... 2 Puffs Every 3 Hours As Needed 21)  Promethazine-Codeine 6.25-10 Mg/24ml Syrp (Promethazine-Codeine) .Marland Kitchen.. 1-2 Teaspoons Every 4-6 Hours As Needed 22)  Zyrtec Allergy 10 Mg Tabs (Cetirizine Hcl) .... Take 1 Tab By Mouth At Bedtime As Needed 23)  Excedrin Extra Strength 250-250-65 Mg Tabs (Aspirin-Acetaminophen-Caffeine) .... Per Bottle 24)  Mucinex Dm 30-600 Mg Xr12h-Tab (Dextromethorphan-Guaifenesin) .... Take 1-2 Tablets Every 12 Hours As Needed 25)  Systane 0.4-0.3 % Soln (Polyethyl Glycol-Propyl Glycol) .Marland Kitchen.. 1 Drop Each Eye As Needed  Allergies  (verified): 1)  ! * Oxycodone 2)  Wellbutrin  Past History:  Past Medical History: Hypertension Hypothyroidism OBESITY  aggravated by prednisone use  - TSH normal 02/03/2008   - target weight 190  - peak all time weight =  291 on 02/03/2008 SARCOIDOSIS (ICD-135) ...........................................................Marland KitchenWert   - symptom onset 2004   - steroid dep since 10/2006 (arthitis and rash) > May 08, 2010 OFF PREDNISONE   - HFA 50 % January 16, 2009  >  90% May 09, 2009 > 90% March 27, 2010    - PFT's 09/06/10 FEV1  2.15 (75%) ratio 83 and DLCO75 GERD (ICD-530.81) HYPOTHYROIDISM (ICD-244.9) HYPERTENSION (ICD-401.9) COMPLEX  MEDICAL REGIMEN    - Calendar requested 09/25/08,     --adjusted October 09, 2008, July 04, 2010  Vital Signs:  Patient profile:   50 year old female Weight:      265.38 pounds O2 Sat:      96 % on Room air Temp:     98.0 degrees F oral Pulse rate:   74 / minute BP sitting:   118 / 60  (left arm)  Vitals Entered By: Vernie Murders (October 03, 2010 10:48 AM)  O2 Flow:  Room air  Physical Exam  Additional Exam:  wt  251 May 09, 2009  >  258 September 22, 2009 > 265 December 27, 2009 >>263 February 07, 2010> 264 March 28, 2010 > 262 May 08, 2010 >268 July 04, 2010 > 265 October 03, 2010  GEN: A/Ox3; pleasant , NAD HEENT:  [place]/AT, , EACs-clear, TMs-wnl, NOSE-clear, THROAT-clear NECK:  Supple w/ fair ROM; no JVD; normal carotid impulses w/o bruits; no thyromegaly or nodules palpated; no lymphadenopathy. RESP  Clear to P & A; w/o, wheezes/ rales/ or rhonchi, diminished breath sounds in the bases bilaterally. CARD:  RRR, no m/r/g   GI:   Soft & nt; nml bowel sounds; no organomegaly or masses detected. Musco: Warm bil,  no calf tenderness, tr edema.  no clubbing, pulses intact,  Skin:  no rash   Impression & Recommendations:  Problem # 1:  SARCOIDOSIS (ICD-135) The goal with a chronic steroid dependent illness is always arriving at the lowest  effective dose that controls the disease/symptoms and not accepting a set "formula" which is based on statistics that don't take into accound individual variability or the natural hx of the dz in every individual patient, which may well vary over time. for now will continue to try off prednisone completely  Problem # 2:  ASTHMA (ICD-493.90)  No evidence of active asthma, the cough is much more typical of  Classic Upper airway cough syndrome, so named because it's frequently impossible to sort out how much is  CR/sinusitis with freq throat clearing (which can be related to primary GERD)   vs  causing  secondary extra esophageal GERD from wide swings in gastric pressure that occur with throat clearing, promoting self use of mint and menthol lozenges that reduce the lower esophageal sphincter tone and exacerbate the problem further These are the same pts who not infrequently have failed to tolerate ace inhibitors,  dry powder inhalers (or even hfa steroids as may be the case here)  or biphosphonates or report having reflux symptoms that don't respond to standard doses of PPI  will add pepcid at hs and try just use the symbicort as needed as it may be irritating the upper airway more than helping the lower.     Medications Added to Medication List This Visit: 1)  Vitamin D3 1000 Unit Tabs (Cholecalciferol) .Marland Kitchen.. 1 once daily  Other Orders: Est. Patient Level III (29562)  Patient Instructions: 1)  Change the way you use symbicort and only 12 hours if breathing difficulty or wheezing 2)  Add pepcid 20 mg one at bedtime 3)  Try chlortrimeton 4 mg one at bedtime 4)  See calendar for specific medication instructions and bring it back for each and every office visit for every healthcare provider you see.  Without it,  you may not receive the best quality medical care that we feel you deserve.  5)  Please schedule a follow-up appointment in 6 weeks, sooner  if needed

## 2010-11-21 NOTE — Assessment & Plan Note (Signed)
Summary: Pulmonary/ f/u ov TRY OFF PREDNISONE   Primary Provider/Referring Provider:  Susann Givens  CC:  6 wk followup.  Pt states that her cough is better.  Still has some cough in the am- prod with thick yellow sputum.  She states had some itchy rash on her left leg a wk ago but this has resolved.Marland Kitchen  History of Present Illness:  11 yobf quit smoking 6/06 with chronic sarcoidosis manifested mostly arthritic complaints (started maybe in retrospect in 2004)  prednisone -dependent since January 2008.  Each time she lowers the dose in the past she has noticed a flare of her arthritis an does not respond to maximum doses of ibuprofen.  ov 03/07/08  without flare of cough or arthritis decreased prednisone to 10 mg one half every other day then  developed w/in a week or two rash and right so increased  Prednisone to 10mg  2 a day until better then tapered to one half daily w/in week rash and bilteral ankle pain recurred then increaese to one daily since 06/20/08  August 14, 2008 ov  rash gone,  maintaining 07-24-09 with increase dry cough x 2 weeks worse at bedtime but doesn't wake her up, no doe, cp, fever, chills, ns or myalgias/rash, back up to 10 per day  September 25, 2008 ov tried 10-5 then cold > chest  and one week prior to ov increased to 10 /day until one day prior to ov when started on 10-5 again.  No significant arthritis flare, no fever or rash. sputum is slightly yellow with no dyspnea or pleuritic pain sweats or unintended weight loss -  reports some better after proaire for up to 4 hours in terms of cough.--Symbicort started and pred held at 10mg  until symptoms better.   October 09, 2008 --ov follow up and med review.  Feeling  better with less cough, dyspnea. Has returned to 07-24-09 on prednisone.    January 16, 2009 on 10 -5 - 10 taper to 5 mg daily  May 09, 2009 maintaining prednisone 10 mg one half daily no flare  rec try one half odd days  August 08, 2009 ov on one half odd days doing ok  so no change  see page 2  February 07, 2010--Presents for follow up and med reivew.Since last visit she is doing okay. She has brought all her meds today and we reviewed them w/ pt education and organized /updated her med calendar.   March 27, 2010 6 wk followup.  Pt c/o cough x 3 wks.  She states that it only happens at night when lies down.  She states that cough is dry.  She states that she has been postponing her pm dose of the symbicort 80 for when the cough occurs and this seems to help. no sob. arthritis ok on 5 mg  every other day dosing. no ocular c/o or rash.  rec try taper to q 3 d dosing and increase symbicort to 160 2bid  May 08, 2010 ov 6 wk followup.  Pt states that her cough is better.  Still has some cough in the am- prod with thick yellow sputum.  She states had some itchy rash on her left leg a wk ago but this has resolved. actually on 2.5 mg q3d with no complaints on the days she misses pred dosing. Pt denies any significant sore throat, dysphagia, itching, sneezing,  nasal congestion or excess secretions,  fever, chills, sweats, unintended wt loss, pleuritic or exertional cp, hempoptysis,  change in activity tolerance  orthopnea pnd or leg swelling.  no ocular or articular cos.  Current Medications (verified): 1)  Synthroid 75 Mcg  Tabs (Levothyroxine Sodium) .... Take One Tab By Mouth Once Daily 2)  Meloxicam 15 Mg Tabs (Meloxicam) .... Take 1 Tablet By Mouth Once A Day 3)  Pantoprazole Sodium 40 Mg Tbec (Pantoprazole Sodium) .... Take 1 Tab By Mouth 30 Minutes Before The First Meal of The Day 4)  Prednisone 5 Mg Tabs (Prednisone) .... 1/2 Every Third Day 5)  Lexapro 10 Mg  Tabs (Escitalopram Oxalate) .... Once Daily 6)  Lyrica 75 Mg Caps (Pregabalin) .... Take 1 Capsule By Mouth Two Times A Day 7)  Benicar Hct 20-12.5 Mg  Tabs (Olmesartan Medoxomil-Hctz) .... One Tablet By Mouth Daily 8)  Vitamin D 16109 Unit Caps (Ergocalciferol) .Marland Kitchen.. 1 Capsule By Mouth Once A Week 9)  Ferrous  Sulfate 325 (65 Fe) Mg Tabs (Ferrous Sulfate) .... Take 1 Tablet By Mouth Once A Day 10)  Centrum Silver  Tabs (Multiple Vitamins-Minerals) .Marland Kitchen.. 1 Once Daily 11)  Calcium 600/vitamin D 600-400 Mg-Unit Tabs (Calcium Carbonate-Vitamin D) .... Take 1 Tablet By Mouth Two Times A Day 12)  Symbicort 160-4.5 Mcg/act Aero (Budesonide-Formoterol Fumarate) .... 2 Puffs Two Times A Day 13)  Restasis 0.05 %  Emul (Cyclosporine) .Marland Kitchen.. 1 Drop Each Eye Twice Daily. 14)  Amrix 15 Mg Xr24h-Cap (Cyclobenzaprine Hcl) .... Take 1 Capsule By Mouth Once A Day 15)  Metformin Hcl 500 Mg Tabs (Metformin Hcl) .Marland Kitchen.. 1 Every Evening With Dinner 16)  Pravastatin Sodium 20 Mg Tabs (Pravastatin Sodium) .Marland Kitchen.. 1 Every Am 17)  Advil 200 Mg Tabs (Ibuprofen) .... Per Bottle 18)  Zolpidem Tartrate 10 Mg  Tabs (Zolpidem Tartrate) .... At Bedtime As Needed 19)  Fluticasone Propionate 50 Mcg/act Susp (Fluticasone Propionate) .... 2 Puffs Two Times A Day As Needed 20)  Proair Hfa 108 (90 Base) Mcg/act Aers (Albuterol Sulfate) .... 2 Puffs Every 3 Hours As Needed 21)  Promethazine-Codeine 6.25-10 Mg/82ml Syrp (Promethazine-Codeine) .Marland Kitchen.. 1-2 Teaspoons Every 4-6 Hours As Needed 22)  Zyrtec Allergy 10 Mg Tabs (Cetirizine Hcl) .... Take 1 Tab By Mouth At Bedtime As Needed 23)  Excedrin Extra Strength 250-250-65 Mg Tabs (Aspirin-Acetaminophen-Caffeine) .... Per Bottle 24)  Mucinex Dm 30-600 Mg Xr12h-Tab (Dextromethorphan-Guaifenesin) .... Take 1-2 Tablets Every 12 Hours As Needed  Allergies (verified): 1)  ! * Oxycodone 2)  Wellbutrin  Past History:  Past Medical History: Hypertension Hypothyroidism OBESITY  aggravated by prednisone use  - TSH normal 02/03/2008   - target weight 190  - peak all time weight =  291 on 02/03/2008 SARCOIDOSIS (ICD-135) ...........................................................Marland KitchenWert   - symptom onset 2004   - steroid dep since 10/2006 (arthitis and rash) > May 08, 2010 try off compltely   - HFA 50 % January 16, 2009  >  90% May 09, 2009 > 90% March 27, 2010  GERD (ICD-530.81) HYPOTHYROIDISM (ICD-244.9) HYPERTENSION (ICD-401.9) COMPLEX MEDICAL REGIMEN    - Calendar requested 09/25/08,     --adjusted October 09, 2008  Vital Signs:  Patient profile:   50 year old female Weight:      262.25 pounds O2 Sat:      96 % on Room air Temp:     98.3 degrees F oral Pulse rate:   72 / minute BP sitting:   110 / 76  (left arm)  Vitals Entered By: Vernie Murders (May 08, 2010 3:15 PM)  O2 Flow:  Room  air  Physical Exam  Additional Exam:  wt  251 May 09, 2009  >  258 September 22, 2009 > 265 December 27, 2009 >>263 February 07, 2010> 264 March 28, 2010 > 262 May 08, 2010  GEN: A/Ox3; pleasant , NAD HEENT:  [place]/AT, , EACs-clear, TMs-wnl, NOSE-clear, THROAT-clear NECK:  Supple w/ fair ROM; no JVD; normal carotid impulses w/o bruits; no thyromegaly or nodules palpated; no lymphadenopathy. RESP  Clear to P & A; w/o, wheezes/ rales/ or rhonchi, diminished breath sounds in the bases bilaterally. CARD:  RRR, no m/r/g   GI:   Soft & nt; nml bowel sounds; no organomegaly or masses detected. Musco: Warm bil,  no calf tenderness, tr edema.  no clubbing, pulses intact,  Skin:  no rash   Impression & Recommendations:  Problem # 1:  SARCOIDOSIS (ICD-135)  The goal with a chronic steroid dependent illness is always arriving at the lowest effective dose that controls the disease/symptoms and not accepting a set "formula" which is based on statistics that don't take into accound individual variability or the natural hx of the dz in every individual patient, which may well vary over time.   Try to eliminate prednisone at this point   Each maintenance medication was reviewed in detail including most importantly the difference between maintenance and as needed and under what circumstances the prns are to be used. This was done in the context of a medication calendar review which provided the patient with a user-friendly  unambiguous mechanism for medication administration and reconciliation and provides an action plan for all active problems. It is critical that this be shown to every doctor  for modification during the office visit if necessary so the patient can use it as a working document.    See instructions for specific recommendations   Medications Added to Medication List This Visit: 1)  Prednisone 5 Mg Tabs (Prednisone) .... 1/2 every third day 2)  Symbicort 160-4.5 Mcg/act Aero (Budesonide-formoterol fumarate) .... 2 puffs two times a day 3)  Metformin Hcl 500 Mg Tabs (Metformin hcl) .Marland Kitchen.. 1 every evening with dinner 4)  Pravastatin Sodium 20 Mg Tabs (Pravastatin sodium) .Marland Kitchen.. 1 every am  Other Orders: Est. Patient Level III (13244)  Patient Instructions: 1)  Stop prednisone today 2)  See Tammy NP w/in 6 weeks with all your medications, even over the counter meds, separated in two separate bags, the ones you take no matter what vs the ones you stop once you feel better and take only as needed.  She will generate for you a new user friendly medication calendar that will put [place] all on the same page re: your medication use.  3)  Late add Set up for pft's and cxr  in 6 weeks after Tammy visit

## 2010-11-21 NOTE — Progress Notes (Signed)
Summary: prescript  Phone Note Call from Patient   Caller: Patient Call For: Sharese Manrique Summary of Call: pt would like 90 day supply of prednisone  [facility] Initial call taken by: Rickard Patience,  January 29, 2010 2:27 PM  Follow-up for Phone Call        rx sent. pt aware. Carron Curie CMA  January 29, 2010 2:36 PM     Prescriptions: PREDNISONE 5 MG TABS (PREDNISONE) one half every other day  #90 x 0   Entered by:   Carron Curie CMA   Authorized by:   Nyoka Cowden MD   Signed by:   Carron Curie CMA on 01/29/2010   Method used:   Electronically to        [facility] [address] [phone]* (retail)       [address]       [place], [place]  [address]       Ph: [phone] or [phone]       Fax: [phone]   RxID:   [id number]

## 2010-11-21 NOTE — Assessment & Plan Note (Signed)
Summary: Pulmonary/ ext ov with hfa teaching try symb 160 and pred q3d   Primary Provider/Referring Provider:  Susann Givens  CC:  6 wk followup.  Pt c/o cough x 3 wks.  She states that it only happens at night when lies down.  She states that cough is dry.  She states that she has been saving her pm dose of the symbicort for when the cough occurs and this seems to help. Marland Kitchen  History of Present Illness:  27 yobf quit smoking 6/06 with chronic sarcoidosis manifested mostly arthritic complaints (started maybe in retrospect in 2004)  prednisone -dependent since January 2008.  Each time she lowers the dose in the past she has noticed a flare of her arthritis an does not respond to maximum doses of ibuprofen.  ov 03/07/08  without flare of cough or arthritis decreased prednisone to 10 mg one half every other day then  developed w/in a week or two rash and right so increased  Prednisone to 10mg  2 a day until better then tapered to one half daily w/in week rash and bilteral ankle pain recurred then increaese to one daily since 06/20/08  August 14, 2008 ov  rash gone,  maintaining 07-24-09 with increase dry cough x 2 weeks worse at bedtime but doesn't wake her up, no doe, cp, fever, chills, ns or myalgias/rash, back up to 10 per day  September 25, 2008 ov tried 10-5 then cold > chest  and one week prior to ov increased to 10 /day until one day prior to ov when started on 10-5 again.  No significant arthritis flare, no fever or rash. sputum is slightly yellow with no dyspnea or pleuritic pain sweats or unintended weight loss -  reports some better after proaire for up to 4 hours in terms of cough.--Symbicort started and pred held at 10mg  until symptoms better.   October 09, 2008 --ov follow up and med review.  Feeling  better with less cough, dyspnea. Has returned to 07-24-09 on prednisone.    January 16, 2009 on 10 -5 - 10 taper to 5 mg daily  May 09, 2009 maintaining prednisone 10 mg one half daily no flare  rec try  one half odd days  August 08, 2009 ov on one half odd days doing ok so no change  see page 2  February 07, 2010--Presents for follow up and med reivew.Since last visit she is doing okay. She has brought all her meds today and we reviewed them w/ pt education and organized /updated her med calendar.   March 27, 2010 6 wk followup.  Pt c/o cough x 3 wks.  She states that it only happens at night when lies down.  She states that cough is dry.  She states that she has been postponing her pm dose of the symbicort 80 for when the cough occurs and this seems to help. no sob. arthritis ok on 5 mg every other day dosing. no ocular c/o or rash. Pt denies any significant sore throat, dysphagia, itching, sneezing,  nasal congestion or excess secretions,  fever, chills, sweats, unintended wt loss, pleuritic or exertional cp, hempoptysis, change in activity tolerance  orthopnea pnd or leg swelling. Pt also denies any obvious fluctuation in symptoms with weather or environmental change or other alleviating or aggravating factors.          Current Medications (verified): 1)  Synthroid 75 Mcg  Tabs (Levothyroxine Sodium) .... Take One Tab By Mouth Once Daily 2)  Meloxicam 15 Mg Tabs (Meloxicam) .... Take 1 Tablet By Mouth Once A Day 3)  Pantoprazole Sodium 40 Mg Tbec (Pantoprazole Sodium) .... Take 1 Tab By Mouth 30 Minutes Before The First Meal of The Day 4)  Prednisone 10 Mg Tabs (Prednisone) .... Take 1/2 Tablet By Mouth Every Other Day 5)  Lexapro 10 Mg  Tabs (Escitalopram Oxalate) .... Once Daily 6)  Lyrica 75 Mg Caps (Pregabalin) .... Take 1 Capsule By Mouth Two Times A Day 7)  Benicar Hct 20-12.5 Mg  Tabs (Olmesartan Medoxomil-Hctz) .... One Tablet By Mouth Daily 8)  Vitamin D 24401 Unit Caps (Ergocalciferol) .Marland Kitchen.. 1 Capsule By Mouth Once A Week 9)  Ferrous Sulfate 325 (65 Fe) Mg Tabs (Ferrous Sulfate) .... Take 1 Tablet By Mouth Once A Day 10)  Centrum Silver  Tabs (Multiple Vitamins-Minerals) .Marland Kitchen.. 1 Once  Daily 11)  Calcium 600/vitamin D 600-400 Mg-Unit Tabs (Calcium Carbonate-Vitamin D) .... Take 1 Tablet By Mouth Two Times A Day 12)  Symbicort 80-4.5 Mcg/act  Aero (Budesonide-Formoterol Fumarate) .... Inhale 2 Puffs Two Times A Day 13)  Restasis 0.05 %  Emul (Cyclosporine) .Marland Kitchen.. 1 Drop Each Eye Twice Daily. 14)  Amrix 15 Mg Xr24h-Cap (Cyclobenzaprine Hcl) .... Take 1 Capsule By Mouth Once A Day 15)  Advil 200 Mg Tabs (Ibuprofen) .... Per Bottle 16)  Zolpidem Tartrate 10 Mg  Tabs (Zolpidem Tartrate) .... At Bedtime As Needed 17)  Fluticasone Propionate 50 Mcg/act Susp (Fluticasone Propionate) .... 2 Puffs Two Times A Day As Needed 18)  Proair Hfa 108 (90 Base) Mcg/act Aers (Albuterol Sulfate) .... 2 Puffs Every 3 Hours As Needed 19)  Promethazine-Codeine 6.25-10 Mg/83ml Syrp (Promethazine-Codeine) .Marland Kitchen.. 1-2 Teaspoons Every 4-6 Hours As Needed 20)  Zyrtec Allergy 10 Mg Tabs (Cetirizine Hcl) .... Take 1 Tab By Mouth At Bedtime As Needed 21)  Excedrin Extra Strength 250-250-65 Mg Tabs (Aspirin-Acetaminophen-Caffeine) .... Per Bottle 22)  Mucinex Dm 30-600 Mg Xr12h-Tab (Dextromethorphan-Guaifenesin) .... Take 1-2 Tablets Every 12 Hours As Needed  Allergies (verified): 1)  ! * Oxycodone 2)  Wellbutrin  Past History:  Past Medical History: Hypertension Hypothyroidism OBESITY  aggravated by prednisone use  - TSH normal 02/03/2008   - target weight 190  - peak all time weight =  291 on 02/03/2008 SARCOIDOSIS (ICD-135) ...........................................................Marland KitchenWert   - symptom onset 2004   - steroid dep since 10/2006 (arthitis and rash)   - HFA 50 % January 16, 2009  >  90% May 09, 2009 > 90% March 27, 2010  GERD (ICD-530.81) HYPOTHYROIDISM (ICD-244.9) HYPERTENSION (ICD-401.9) COMPLEX MEDICAL REGIMEN    - Calendar requested 09/25/08,     --adjusted October 09, 2008  Vital Signs:  Patient profile:   50 year old female Weight:      264 pounds O2 Sat:      97 % on Room  air Temp:     98.3 degrees F oral Pulse rate:   73 / minute BP sitting:   96 / 62  (left arm) Cuff size:   large  Vitals Entered By: Vernie Murders (March 27, 2010 3:49 PM)  O2 Flow:  Room air  Physical Exam  Additional Exam:  wt  251 May 09, 2009  >  258 September 22, 2009 > 265 December 27, 2009 >>263 February 07, 2010> 264 March 28, 2010  GEN: A/Ox3; pleasant , NAD HEENT:  [place]/AT, , EACs-clear, TMs-wnl, NOSE-clear, THROAT-clear NECK:  Supple w/ fair ROM; no JVD; normal carotid impulses w/o  bruits; no thyromegaly or nodules palpated; no lymphadenopathy. RESP  Clear to P & A; w/o, wheezes/ rales/ or rhonchi, diminished breath sounds in the bases bilaterally. CARD:  RRR, no m/r/g   GI:   Soft & nt; nml bowel sounds; no organomegaly or masses detected. Musco: Warm bil,  no calf tenderness, tr edema.  no clubbing, pulses intact,  Skin:  no rash   Impression & Recommendations:  Problem # 1:  SARCOIDOSIS (ICD-135) The goal with a chronic steroid dependent illness is always arriving at the lowest effective dose that controls the disease/symptoms and not accepting a set "formula" which is based on statistics that don't take into accound individual variability or the natural hx of the dz in every individual patient, which may well vary over time.   Try 5 mg q3d while maximizing symbicort  Problem # 2:  ASTHMA (ICD-493.90) Not clear if this is primary asthma or secondary to sarcoid but this is a moot issue.  I spent extra time with the patient today explaining optimal mdi  technique.  This improved from  50-90% and will be key to maintain at that level to have any chance of minimizing or weaning off chronic systemic steroid rx - try the 160 dose @ 2 puffs first thing  in am and 2 puffs again in pm about 12 hours later    Each maintenance medication was reviewed in detail including most importantly the difference between maintenance and as needed and under what circumstances the prns are to be used.  This was done in the context of a medication calendar review which provided the patient with a user-friendly unambiguous mechanism for medication administration and reconciliation and provides an action plan for all active problems. It is critical that this be shown to every doctor  for modification during the office visit if necessary so the patient can use it as a working document.   Other Orders: Est. Patient Level III (16109)  Patient Instructions: 1)  Try the higher strength symbicort 160 2 puffs first thing  in am and 2 puffs again in pm about 12 hours later  2)  If lots better after 2 weeks call for new rx 3)  If no difference go back to 80 2 puffs first thing  in am and 2 puffs again in pm about 12 hours later 4)  Try prednisone 10 mg one half every 3rd day 5)  Please schedule a follow-up appointment in 6 weeks, sooner if needed   Appended Document: Pulmonary/ ext ov with hfa teaching try symb 160 and pred q3d copy to Dr Susann Givens  Appended Document: Pulmonary/ ext ov with hfa teaching try symb 160 and pred q3d copy was sent to [facility] via [employer].

## 2010-11-21 NOTE — Assessment & Plan Note (Signed)
Summary: NP follow up - med calendar   Primary Provider/Referring Provider:  Susann Givens  CC:  est med calendar - pt brought all meds with her today.  no new complaints.  History of Present Illness:  50 yobf quit smoking 6/06 with chronic sarcoidosis manifested mostly arthritic complaints (started maybe in retrospect in 2004)  prednisone -dependent since January 2008.  Each time she lowers the dose in the past she has noticed a flare of her arthritis an does not respond to maximum doses of ibuprofen.  ov 03/07/08  without flare of cough or arthritis decreased prednisone to 10 mg one half every other day then  developed w/in a week or two rash and right so increased  Prednisone to 10mg  2 a day until better then tapered to one half daily w/in week rash and bilteral ankle pain recurred then increaese to one daily since 06/20/08  August 14, 2008 ov  rash gone,  maintaining 07-24-09 with increase dry cough x 2 weeks worse at bedtime but doesn't wake her up, no doe, cp, fever, chills, ns or myalgias/rash, back up to 10 per day  September 25, 2008 ov tried 10-5 then cold > chest  and one week prior to ov increased to 10 /day until one day prior to ov when started on 10-5 again.  No significant arthritis flare, no fever or rash. sputum is slightly yellow with no dyspnea or pleuritic pain sweats or unintended weight loss -  reports some better after proaire for up to 4 hours in terms of cough.--Symbicort started and pred held at 10mg  until symptoms better.   October 09, 2008 --ov follow up and med review.  Feeling  better with less cough, dyspnea. Has returned to 07-24-09 on prednisone.    January 16, 2009 on 10 -5 - 10 taper to 5 mg daily  May 09, 2009 maintaining prednisone 10 mg one half daily no flare  rec try one half odd days  August 08, 2009 ov on one half odd days doing ok so no change  see page 2  February 07, 2010--Presents for follow up and med reivew.Since last visit she is doing okay. She has  brought all her meds today and we reviewed them w/ pt education and organized /updated her med calendar.   March 27, 2010 6 wk followup.  Pt c/o cough x 3 wks.  She states that it only happens at night when lies down.  She states that cough is dry.  She states that she has been postponing her pm dose of the symbicort 80 for when the cough occurs and this seems to help. no sob. arthritis ok on 5 mg  every other day dosing. no ocular c/o or rash.  rec try taper to q 3 d dosing and increase symbicort to 160 2bid  May 08, 2010 ov 6 wk followup.  Pt states that her cough is better.  Still has some cough in the am- prod with thick yellow sputum.  She states had some itchy rash on her left leg a wk ago but this has resolved. actually on 2.5 mg q3d with no complaints on the days she misses pred dosing. >>Prednisone stopped.   July 04, 2010--Presents for est med calendar - pt brought all meds with her today.  no new complaints. Last visit weaned off steroids. No flare since last vist. We reviewed her meds and updated her med calendar. She is doing well. Got her flu shot yesterday. Denies chest pain,  orthopnea, hemoptysis, fever, n/v/d, edema, headache.   Medications Prior to Update: 1)  Synthroid 75 Mcg  Tabs (Levothyroxine Sodium) .... Take One Tab By Mouth Once Daily 2)  Meloxicam 15 Mg Tabs (Meloxicam) .... Take 1 Tablet By Mouth Once A Day 3)  Pantoprazole Sodium 40 Mg Tbec (Pantoprazole Sodium) .... Take 1 Tab By Mouth 30 Minutes Before The First Meal of The Day 4)  Prednisone 5 Mg Tabs (Prednisone) .... 1/2 Every Third Day 5)  Lexapro 10 Mg  Tabs (Escitalopram Oxalate) .... Once Daily 6)  Lyrica 75 Mg Caps (Pregabalin) .... Take 1 Capsule By Mouth Two Times A Day 7)  Benicar Hct 20-12.5 Mg  Tabs (Olmesartan Medoxomil-Hctz) .... One Tablet By Mouth Daily 8)  Vitamin D 16109 Unit Caps (Ergocalciferol) .Marland Kitchen.. 1 Capsule By Mouth Once A Week 9)  Ferrous Sulfate 325 (65 Fe) Mg Tabs (Ferrous Sulfate) ....  Take 1 Tablet By Mouth Once A Day 10)  Centrum Silver  Tabs (Multiple Vitamins-Minerals) .Marland Kitchen.. 1 Once Daily 11)  Calcium 600/vitamin D 600-400 Mg-Unit Tabs (Calcium Carbonate-Vitamin D) .... Take 1 Tablet By Mouth Two Times A Day 12)  Symbicort 160-4.5 Mcg/act Aero (Budesonide-Formoterol Fumarate) .... 2 Puffs Two Times A Day 13)  Restasis 0.05 %  Emul (Cyclosporine) .Marland Kitchen.. 1 Drop Each Eye Twice Daily. 14)  Amrix 15 Mg Xr24h-Cap (Cyclobenzaprine Hcl) .... Take 1 Capsule By Mouth Once A Day 15)  Metformin Hcl 500 Mg Tabs (Metformin Hcl) .Marland Kitchen.. 1 Every Evening With Dinner 16)  Pravastatin Sodium 20 Mg Tabs (Pravastatin Sodium) .Marland Kitchen.. 1 Every Am 17)  Advil 200 Mg Tabs (Ibuprofen) .... Per Bottle 18)  Zolpidem Tartrate 10 Mg  Tabs (Zolpidem Tartrate) .... At Bedtime As Needed 19)  Fluticasone Propionate 50 Mcg/act Susp (Fluticasone Propionate) .... 2 Puffs Two Times A Day As Needed 20)  Proair Hfa 108 (90 Base) Mcg/act Aers (Albuterol Sulfate) .... 2 Puffs Every 3 Hours As Needed 21)  Promethazine-Codeine 6.25-10 Mg/58ml Syrp (Promethazine-Codeine) .Marland Kitchen.. 1-2 Teaspoons Every 4-6 Hours As Needed 22)  Zyrtec Allergy 10 Mg Tabs (Cetirizine Hcl) .... Take 1 Tab By Mouth At Bedtime As Needed 23)  Excedrin Extra Strength 250-250-65 Mg Tabs (Aspirin-Acetaminophen-Caffeine) .... Per Bottle 24)  Mucinex Dm 30-600 Mg Xr12h-Tab (Dextromethorphan-Guaifenesin) .... Take 1-2 Tablets Every 12 Hours As Needed  Allergies (verified): 1)  ! * Oxycodone 2)  Wellbutrin  Past History:  Family History: Last updated: 02/07/2010 clotting disorders - MGM rheumatism - MGM, mother cancer - mother (bladder)  Social History: Last updated: 02/07/2010 quit smoking June of 2006.  x43yrs 1/2ppd occ alcohol married 1 children currently unemployed - [employer] going to school for CMA  Risk Factors: Smoking Status: quit (06/28/2008)  Past Medical History: Hypertension Hypothyroidism OBESITY  aggravated by prednisone use  - TSH  normal 02/03/2008   - target weight 190  - peak all time weight =  291 on 02/03/2008 SARCOIDOSIS (ICD-135) ...........................................................Marland KitchenWert   - symptom onset 2004   - steroid dep since 10/2006 (arthitis and rash) > May 08, 2010 try off compltely   - HFA 50 % January 16, 2009  >  90% May 09, 2009 > 90% March 27, 2010  GERD (ICD-530.81) HYPOTHYROIDISM (ICD-244.9) HYPERTENSION (ICD-401.9) COMPLEX MEDICAL REGIMEN    - Calendar requested 09/25/08,     --adjusted October 09, 2008, July 04, 2010  Review of Systems      See HPI  Vital Signs:  Patient profile:   50 year old female Height:  67 inches Weight:      268.25 pounds BMI:     42.17 O2 Sat:      97 % on Room air Temp:     97.3 degrees F oral Pulse rate:   74 / minute BP sitting:   106 / 74  (left arm) Cuff size:   large  Vitals Entered By: Boone Master CNA/MA (July 04, 2010 3:08 PM)  O2 Flow:  Room air CC: est med calendar - pt brought all meds with her today.  no new complaints Is Patient Diabetic? Yes Comments Medications reviewed with patient Daytime contact number verified with patient. Boone Master CNA/MA  July 04, 2010 3:08 PM    Physical Exam  Additional Exam:  wt  251 May 09, 2009  >  258 September 22, 2009 > 265 December 27, 2009 >>263 February 07, 2010> 264 March 28, 2010 > 262 May 08, 2010 >268 July 04, 2010 GEN: A/Ox3; pleasant , NAD HEENT:  [place]/AT, , EACs-clear, TMs-wnl, NOSE-clear, THROAT-clear NECK:  Supple w/ fair ROM; no JVD; normal carotid impulses w/o bruits; no thyromegaly or nodules palpated; no lymphadenopathy. RESP  Clear to P & A; w/o, wheezes/ rales/ or rhonchi, diminished breath sounds in the bases bilaterally. CARD:  RRR, no m/r/g   GI:   Soft & nt; nml bowel sounds; no organomegaly or masses detected. Musco: Warm bil,  no calf tenderness, tr edema.  no clubbing, pulses intact,  Skin:  no rash   Impression & Recommendations:  Problem # 1:   SARCOIDOSIS (ICD-135) Doing well off steroids w/ no sarcoid flare.  .Meds reviewed with pt education and computerized med calendar completed/adjusted.   follow up 2 months Dr. Sherene Sires and as needed   Complete Medication List: 1)  Synthroid 75 Mcg Tabs (Levothyroxine sodium) .... Take one tab by mouth once daily 2)  Meloxicam 15 Mg Tabs (Meloxicam) .... Take 1 tablet by mouth once a day 3)  Pantoprazole Sodium 40 Mg Tbec (Pantoprazole sodium) .... Take 1 tab by mouth 30 minutes before the first meal of the day 4)  Prednisone 5 Mg Tabs (Prednisone) .... 1/2 every third day 5)  Lexapro 10 Mg Tabs (Escitalopram oxalate) .... Once daily 6)  Lyrica 75 Mg Caps (Pregabalin) .... Take 1 capsule by mouth two times a day 7)  Benicar Hct 20-12.5 Mg Tabs (Olmesartan medoxomil-hctz) .... One tablet by mouth daily 8)  Vitamin D 16109 Unit Caps (Ergocalciferol) .Marland Kitchen.. 1 capsule by mouth once a week 9)  Ferrous Sulfate 325 (65 Fe) Mg Tabs (Ferrous sulfate) .... Take 1 tablet by mouth once a day 10)  Centrum Silver Tabs (Multiple vitamins-minerals) .Marland Kitchen.. 1 once daily 11)  Calcium 600/vitamin D 600-400 Mg-unit Tabs (Calcium carbonate-vitamin d) .... Take 1 tablet by mouth two times a day 12)  Symbicort 160-4.5 Mcg/act Aero (Budesonide-formoterol fumarate) .... 2 puffs two times a day 13)  Restasis 0.05 % Emul (Cyclosporine) .Marland Kitchen.. 1 drop each eye twice daily. 14)  Amrix 15 Mg Xr24h-cap (Cyclobenzaprine hcl) .... Take 1 capsule by mouth once a day 15)  Metformin Hcl 500 Mg Tabs (Metformin hcl) .Marland Kitchen.. 1 every evening with dinner 16)  Pravastatin Sodium 20 Mg Tabs (Pravastatin sodium) .Marland Kitchen.. 1 every am 17)  Advil 200 Mg Tabs (Ibuprofen) .... Per bottle 18)  Zolpidem Tartrate 10 Mg Tabs (Zolpidem tartrate) .... At bedtime as needed 19)  Fluticasone Propionate 50 Mcg/act Susp (Fluticasone propionate) .... 2 puffs two times a day as needed 20)  Proair Hfa 108 (90 Base) Mcg/act Aers (Albuterol sulfate) .... 2 puffs every 3 hours  as needed 21)  Promethazine-codeine 6.25-10 Mg/46ml Syrp (Promethazine-codeine) .Marland Kitchen.. 1-2 teaspoons every 4-6 hours as needed 22)  Zyrtec Allergy 10 Mg Tabs (Cetirizine hcl) .... Take 1 tab by mouth at bedtime as needed 23)  Excedrin Extra Strength 250-250-65 Mg Tabs (Aspirin-acetaminophen-caffeine) .... Per bottle 24)  Mucinex Dm 30-600 Mg Xr12h-tab (Dextromethorphan-guaifenesin) .... Take 1-2 tablets every 12 hours as needed  Other Orders: Est. Patient Level III (16109)  Patient Instructions: 1)  Follow med calendar closely and bring to each visit.  2)  follow up Dr. Sherene Sires in 2 months  and as needed    Immunization History:  Influenza Immunization History:    Influenza:  historical (06/28/2010)   Appended Document: med list update    Clinical Lists Changes  Medications: Changed medication from PRAVASTATIN SODIUM 20 MG TABS (PRAVASTATIN SODIUM) 1 every am to PRAVASTATIN SODIUM 20 MG TABS (PRAVASTATIN SODIUM) Take 1 tab by mouth at bedtime Added new medication of ROPINIROLE HCL 0.5 MG TABS (ROPINIROLE HCL) 1/2 - 2 tabs by mouth at bedtime Removed medication of PREDNISONE 5 MG TABS (PREDNISONE) 1/2 every third day Added new medication of SYSTANE 0.4-0.3 % SOLN (POLYETHYL GLYCOL-PROPYL GLYCOL) 1 drop each eye as needed

## 2010-11-27 ENCOUNTER — Ambulatory Visit (INDEPENDENT_AMBULATORY_CARE_PROVIDER_SITE_OTHER): Payer: PRIVATE HEALTH INSURANCE | Admitting: Family Medicine

## 2010-11-27 DIAGNOSIS — Z79899 Other long term (current) drug therapy: Secondary | ICD-10-CM

## 2010-11-27 DIAGNOSIS — J069 Acute upper respiratory infection, unspecified: Secondary | ICD-10-CM

## 2010-11-27 DIAGNOSIS — D869 Sarcoidosis, unspecified: Secondary | ICD-10-CM

## 2010-11-27 DIAGNOSIS — N390 Urinary tract infection, site not specified: Secondary | ICD-10-CM

## 2010-12-04 ENCOUNTER — Ambulatory Visit (INDEPENDENT_AMBULATORY_CARE_PROVIDER_SITE_OTHER): Payer: PRIVATE HEALTH INSURANCE | Admitting: Internal Medicine

## 2010-12-04 ENCOUNTER — Encounter: Payer: Self-pay | Admitting: Internal Medicine

## 2010-12-04 DIAGNOSIS — K219 Gastro-esophageal reflux disease without esophagitis: Secondary | ICD-10-CM

## 2010-12-04 DIAGNOSIS — D869 Sarcoidosis, unspecified: Secondary | ICD-10-CM

## 2010-12-06 ENCOUNTER — Ambulatory Visit (INDEPENDENT_AMBULATORY_CARE_PROVIDER_SITE_OTHER): Payer: PRIVATE HEALTH INSURANCE

## 2010-12-06 DIAGNOSIS — N390 Urinary tract infection, site not specified: Secondary | ICD-10-CM

## 2010-12-09 ENCOUNTER — Ambulatory Visit: Payer: PRIVATE HEALTH INSURANCE

## 2010-12-11 NOTE — Assessment & Plan Note (Signed)
Summary: Pulmonary/  ov try off pepcid   Primary Provider/Referring Provider:  Susann Givens  CC:  Cough- improved.  History of Present Illness: 6  yobf quit smoking 03/2005 with chronic sarcoidosis manifested mostly arthritic complaints (started maybe in retrospect in 2004)  prednisone -dependent since January 2008.  Each time she lowers the dose in the past she has noticed a flare of her arthritis an does not respond to maximum doses of ibuprofen.  ov 03/07/08  without flare of cough or arthritis decreased prednisone to 10 mg one half every other day then  developed w/in a week or two rash and right so increased  Prednisone to 10mg  2 a day until better then tapered to one half daily w/in week rash and bilteral ankle pain recurred then increaese to one daily since 06/20/08  August 14, 2008 ov  rash gone,  maintaining 07-24-09 with increase dry cough x 2 weeks worse at bedtime but doesn't wake her up, no doe, cp, fever, chills, ns or myalgias/rash, back up to 10 per day  September 25, 2008 ov tried 10-5 then cold > chest  and one week prior to ov increased to 10 /day until one day prior to ov when started on 10-5 again.  No significant arthritis flare, no fever or rash. sputum is slightly yellow with no dyspnea or pleuritic pain sweats or unintended weight loss -  reports some better after proaire for up to 4 hours in terms of cough.--Symbicort started and pred held at 10mg  until symptoms better.   October 09, 2008 --ov follow up and med review.  Feeling  better with less cough, dyspnea. Has returned to 07-24-09 on prednisone.    January 16, 2009 on 10 -5 - 10 taper to 5 mg daily  May 09, 2009 maintaining prednisone 10 mg one half daily no flare  rec try one half odd days  August 08, 2009 ov on one half odd days doing ok so no change  see page 2  February 07, 2010--Presents for follow up and med reivew.Since last visit she is doing okay. She has brought all her meds today and we reviewed them w/ pt  education and organized /updated her med calendar.   March 27, 2010 6 wk followup.  Pt c/o cough x 3 wks.  She states that it only happens at night when lies down.  She states that cough is dry.  She states that she has been postponing her pm dose of the symbicort 80 for when the cough occurs and this seems to help. no sob. arthritis ok on 5 mg  every other day dosing. no ocular c/o or rash.  rec try taper to q 3 d dosing and increase symbicort to 160 2bid  May 08, 2010 ov 6 wk followup.  Pt states that her cough is better.  Still has some cough in the am- prod with thick yellow sputum.  She states had some itchy rash on her left leg a wk ago but this has resolved. actually on 2.5 mg q3d with no complaints on the days she misses pred dosing. >>Prednisone stopped.   July 04, 2010--Presents for est med calendar - pt brought all meds with her today.  no new complaints. Last visit weaned off steroids. No flare since last vist. We reviewed her meds and updated her med calendar.  . Got her flu shot yesterday.  no change rx  October 04, 2010 ov  no chang cc doe but hacky dry cough  worse, feels like throat is irritated ? from symbicort esp in am.  Change the way you use symbicort and only 12 hours if breathing difficulty or wheezing Add pepcid 20 mg one at bedtime Try chlortrimeton 4 mg one at bedtime  December 04, 2010 ov all better, rare need for symbicort, all smiles, no cough or sob. Pt denies any significant sore throat, dysphagia, itching, sneezing,  nasal congestion or excess secretions,  fever, chills, sweats, unintended wt loss, pleuritic or exertional cp, hempoptysis, change in activity tolerance  orthopnea pnd or leg swelling     Current Medications (verified): 1)  Synthroid 75 Mcg  Tabs (Levothyroxine Sodium) .... Take One Tab By Mouth Once Daily 2)  Meloxicam 15 Mg Tabs (Meloxicam) .... Take 1 Tablet By Mouth Once A Day 3)  Pantoprazole Sodium 40 Mg Tbec (Pantoprazole Sodium) .... Take  1 Tab By Mouth 30 Minutes Before The First Meal of The Day 4)  Pravastatin Sodium 20 Mg Tabs (Pravastatin Sodium) .... Take 1 Tab By Mouth At Bedtime 5)  Lyrica 75 Mg Caps (Pregabalin) .... Take 1 Capsule By Mouth Two Times A Day 6)  Benicar Hct 20-12.5 Mg  Tabs (Olmesartan Medoxomil-Hctz) .... One Tablet By Mouth Daily 7)  Vitamin D3 1000 Unit Tabs (Cholecalciferol) .Marland Kitchen.. 1 Once Daily 8)  Ferrous Sulfate 325 (65 Fe) Mg Tabs (Ferrous Sulfate) .... Take 1 Tablet By Mouth Once A Day 9)  Centrum Silver  Tabs (Multiple Vitamins-Minerals) .Marland Kitchen.. 1 Once Daily 10)  Calcium 600/vitamin D 600-400 Mg-Unit Tabs (Calcium Carbonate-Vitamin D) .... Take 1 Tablet By Mouth Two Times A Day 11)  Symbicort 160-4.5 Mcg/act Aero (Budesonide-Formoterol Fumarate) .... 2 Puffs Two Times A Day 12)  Restasis 0.05 %  Emul (Cyclosporine) .Marland Kitchen.. 1 Drop Each Eye Twice Daily. 13)  Amrix 15 Mg Xr24h-Cap (Cyclobenzaprine Hcl) .... Take 1 Capsule By Mouth Once A Day 14)  Ropinirole Hcl 0.5 Mg Tabs (Ropinirole Hcl) .... 1/2 - 2 Tabs By Mouth At Bedtime 15)  Metformin Hcl 500 Mg Tabs (Metformin Hcl) .Marland Kitchen.. 1 Every Evening With Dinner 16)  Advil 200 Mg Tabs (Ibuprofen) .... Per Bottle 17)  Zolpidem Tartrate 10 Mg  Tabs (Zolpidem Tartrate) .... At Bedtime As Needed 18)  Fluticasone Propionate 50 Mcg/act Susp (Fluticasone Propionate) .... 2 Puffs Two Times A Day As Needed 19)  Proair Hfa 108 (90 Base) Mcg/act Aers (Albuterol Sulfate) .... 2 Puffs Every 3 Hours As Needed 20)  Promethazine-Codeine 6.25-10 Mg/2ml Syrp (Promethazine-Codeine) .Marland Kitchen.. 1-2 Teaspoons Every 4-6 Hours As Needed 21)  Zyrtec Allergy 10 Mg Tabs (Cetirizine Hcl) .... Take 1 Tab By Mouth At Bedtime As Needed 22)  Excedrin Extra Strength 250-250-65 Mg Tabs (Aspirin-Acetaminophen-Caffeine) .... Per Bottle 23)  Mucinex Dm 30-600 Mg Xr12h-Tab (Dextromethorphan-Guaifenesin) .... Take 1-2 Tablets Every 12 Hours As Needed 24)  Systane 0.4-0.3 % Soln (Polyethyl Glycol-Propyl Glycol)  .Marland Kitchen.. 1 Drop Each Eye As Needed 25)  Chlor-Trimeton 4 Mg Tabs (Chlorpheniramine Maleate) .Marland Kitchen.. 1 Every 6 Hrs As Needed 26)  Pepcid 20 Mg Tabs (Famotidine) .Marland Kitchen.. 1 At Bedtime As Needed  Allergies (verified): 1)  ! * Oxycodone 2)  Wellbutrin  Past History:  Past Medical History: Hypertension Hypothyroidism OBESITY  aggravated by prednisone use  - TSH normal 02/03/2008   - target weight 190  - peak all time weight =  291 on 02/03/2008 SARCOIDOSIS (ICD-135) ..............................................................Marland KitchenWert   - symptom onset 2004   - steroid dep since 10/2006 (arthitis and rash) > May 08, 2010 OFF PREDNISONE   -  HFA 50 % January 16, 2009  >  90% May 09, 2009 > 90% March 27, 2010    - PFT's 09/06/10 FEV1  2.15 (75%) ratio 83 and DLCO75 GERD (ICD-530.81) HYPOTHYROIDISM (ICD-244.9) HYPERTENSION (ICD-401.9) COMPLEX MEDICAL REGIMEN    - Calendar requested 09/25/08,     --adjusted October 09, 2008, July 04, 2010  Vital Signs:  Patient profile:   50 year old female Weight:      261 pounds O2 Sat:      97 % on Room air Temp:     98.0 degrees F oral Pulse rate:   79 / minute BP sitting:   98 / 60  (left arm)  Vitals Entered By: Vernie Murders (December 04, 2010 10:32 AM)  O2 Flow:  Room air  Physical Exam  Additional Exam:  wt  251 May 09, 2009  >    > 262 May 08, 2010  > 261 December 04, 2010  GEN: A/Ox3; pleasant , NAD HEENT:  [place]/AT, , EACs-clear, TMs-wnl, NOSE-clear, THROAT-clear NECK:  Supple w/ fair ROM; no JVD; normal carotid impulses w/o bruits; no thyromegaly or nodules palpated; no lymphadenopathy. RESP  Clear to P & A; w/o, wheezes/ rales/ or rhonchi, diminished breath sounds in the bases bilaterally. CARD:  RRR, no m/r/g   GI:   Soft & nt; nml bowel sounds; no organomegaly or masses detected. Musco: Warm bil,  no calf tenderness, tr edema.  no clubbing, pulses intact,  Skin:  no rash   Impression & Recommendations:  Problem # 1:  SARCOIDOSIS  (ICD-135)  The goal with a chronic steroid dependent illness is always arriving at the lowest effective dose that controls the disease/symptoms and not accepting a set "formula" which is based on statistics that don't take into accound individual variability or the natural hx of the dz in every individual patient, which may well vary over time. for now will continue  off prednisone completely  Problem # 2:  GERD (ICD-530.81)  Her updated medication list for this problem includes:    Pantoprazole Sodium 40 Mg Tbec (Pantoprazole sodium) .Marland Kitchen... Take 1 tab by mouth 30 minutes before the first meal of the day    Pepcid 20 Mg Tabs (Famotidine) .Marland Kitchen... 1 at bedtime as needed  Try to reduce the pepcid to as needed exac or cough or sob or obvious noct HB   Each maintenance medication was reviewed in detail including most importantly the difference between maintenance and as needed and under what circumstances the prns are to be used.  This was done in the context of a medication calendar review which provided the patient with a user-friendly unambiguous mechanism for medication administration and reconciliation and provides an action plan for all active problems. It is critical that this be shown to every doctor  for modification during the office visit if necessary so the patient can use it as a working document.   Orders: Est. Patient Level III (56213)  Medications Added to Medication List This Visit: 1)  Chlor-trimeton 4 Mg Tabs (Chlorpheniramine maleate) .Marland Kitchen.. 1 every 6 hrs as needed 2)  Pepcid 20 Mg Tabs (Famotidine) .Marland Kitchen.. 1 at bedtime as needed  Patient Instructions: 1)  Ok to try off pepcid at bedtime as long as no increase in cough or tickle but would restart if flares 2)  GERD (REFLUX)  is a common cause of respiratory symptoms. It commonly presents without heartburn and can be treated with medication, but also with lifestyle changes including avoidance  of late meals, excessive alcohol, smoking  cessation, and avoid fatty foods, chocolate, peppermint, colas, red wine, and acidic juices such as orange juice. NO MINT OR MENTHOL PRODUCTS SO NO COUGH DROPS  3)  USE SUGARLESS CANDY INSTEAD (jolley ranchers)  4)  NO OIL BASED VITAMINS  5)  See Tammy NP w/in 3 months with all your medications, even over the counter meds, separated in two separate bags, the ones you take no matter what vs the ones you stop once you feel better and take only as needed.  She will generate for you a new user friendly medication calendar that will put [place] all on the same page re: your medication use.

## 2011-01-29 LAB — D-DIMER, QUANTITATIVE: D-Dimer, Quant: <0.22 ug/mL-FEU (ref 0.00–0.48)

## 2011-01-29 LAB — DIFFERENTIAL
Basophils Absolute: 0.1 10*3/uL (ref 0.0–0.1)
Basophils Relative: 1 % (ref 0–1)
Eosinophils Absolute: 0.1 10*3/uL (ref 0.0–0.7)
Eosinophils Relative: 2 % (ref 0–5)
Lymphs Abs: 2.3 10*3/uL (ref 0.7–4.0)
Neutrophils Relative %: 63 % (ref 43–77)

## 2011-01-29 LAB — CBC
HCT: 39 % (ref 36.0–46.0)
MCV: 91.7 fL (ref 78.0–100.0)
Platelets: 192 10*3/uL (ref 150–400)
RDW: 13.9 % (ref 11.5–15.5)
WBC: 8.5 10*3/uL (ref 4.0–10.5)

## 2011-01-29 LAB — POCT I-STAT, CHEM 8
BUN: 15 mg/dL (ref 6–23)
Calcium, Ion: 1.11 mmol/L — ABNORMAL LOW (ref 1.12–1.32)
Chloride: 105 mEq/L (ref 96–112)
Glucose, Bld: 106 mg/dL — ABNORMAL HIGH (ref 70–99)
TCO2: 25 mmol/L (ref 0–100)

## 2011-01-29 LAB — POCT CARDIAC MARKERS

## 2011-02-07 ENCOUNTER — Encounter: Payer: PRIVATE HEALTH INSURANCE | Admitting: Adult Health

## 2011-02-12 ENCOUNTER — Encounter: Payer: Self-pay | Admitting: Adult Health

## 2011-02-14 ENCOUNTER — Ambulatory Visit (INDEPENDENT_AMBULATORY_CARE_PROVIDER_SITE_OTHER): Payer: PRIVATE HEALTH INSURANCE | Admitting: Adult Health

## 2011-02-14 ENCOUNTER — Encounter: Payer: Self-pay | Admitting: Adult Health

## 2011-02-14 DIAGNOSIS — J45909 Unspecified asthma, uncomplicated: Secondary | ICD-10-CM

## 2011-02-14 DIAGNOSIS — D869 Sarcoidosis, unspecified: Secondary | ICD-10-CM

## 2011-02-14 NOTE — Patient Instructions (Signed)
Continue on same regimen Follow med calendar closely and bring to each visit.  follow up Dr. Sherene Sires  In 3 months

## 2011-02-14 NOTE — Progress Notes (Signed)
Subjective:    Patient ID: Lisa [name], female    DOB: [birth year], 50 y.o.   MRN: [id number]  HPI 21 yobf quit smoking 03/2005 with chronic sarcoidosis manifested mostly arthritic complaints (started maybe in retrospect in 2004) prednisone -dependent since January 2008. Each time she lowers the dose in the past she has noticed a flare of her arthritis an does not respond to maximum doses of ibuprofen.    March 27, 2010 6 wk followup. Pt c/o cough x 3 wks. She states that it only happens at night when lies down. She states that cough is dry. She states that she has been postponing her pm dose of the symbicort 80 for when the cough occurs and this seems to help. no sob. arthritis ok on 5 mg every other day dosing. no ocular c/o or rash. rec try taper to q 3 d dosing and increase symbicort to 160 2bid   May 08, 2010 ov 6 wk followup. Pt states that her cough is better. Still has some cough in the am- prod with thick yellow sputum. She states had some itchy rash on her left leg a wk ago but this has resolved. actually on 2.5 mg q3d with no complaints on the days she misses pred dosing. >>Prednisone stopped.   July 04, 2010--Presents for est med calendar - pt brought all meds with her today. no new complaints. Last visit weaned off steroids. No flare since last vist. We reviewed her meds and updated her med calendar. . Got her flu shot yesterday. no change rx   October 04, 2010 ov no chang cc doe but hacky dry cough worse, feels like throat is irritated ? from symbicort esp in am. Change the way you use symbicort and only 12 hours if breathing difficulty or wheezing  Add pepcid 20 mg one at bedtime  Try chlortrimeton 4 mg one at bedtime   December 04, 2010 ov all better, rare need for symbicort, all smiles, no cough or sob. >>no changes   02/14/2011 follow up and med review Pt returns for follow up and med review. She is doing well since last visit . No flare in cough or dyspena.  Rare use of  ProAir.  Uses Symbicort As needed  . Rare use. No significant drainage or tickle in throat. Has  Stopped clortrimeton. Has zyrtec but not using it.   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      Review of Systems Constitutional:   No  weight loss, night sweats,  Fevers, chills, fatigue, or  lassitude.  HEENT:   No headaches,  Difficulty swallowing,  Tooth/dental problems, or  Sore throat,                No sneezing, itching, ear ache, nasal congestion, post nasal drip,   CV:  No chest pain,  Orthopnea, PND, swelling in lower extremities, anasarca, dizziness, palpitations, syncope.   GI  No heartburn, indigestion, abdominal pain, nausea, vomiting, diarrhea, change in bowel habits, loss of appetite, bloody stools.   Resp: No shortness of breath with exertion or at rest.  No excess mucus, no productive cough,  No non-productive cough,  No coughing up of blood.  No change in color of mucus.  No wheezing.  No chest wall deformity  Skin: no rash or lesions.  GU: no dysuria, change in color of urine, no urgency or frequency.  No flank pain, no hematuria   MS:  No joint pain or  swelling.  No decreased range of motion.    Psych:  No change in mood or affect. No depression or anxiety.  No memory loss.         Objective:   Physical Exam GEN: A/Ox3; pleasant , NAD, well nourished , obese  HEENT:  [place]/AT,  EACs-clear, TMs-wnl, NOSE-clear, THROAT-clear, no lesions, no postnasal drip or exudate noted.   NECK:  Supple w/ fair ROM; no JVD; normal carotid impulses w/o bruits; no thyromegaly or nodules palpated; no lymphadenopathy.  RESP  Coarse BS w/ no wheezing.    CARD:  RRR, no m/r/g  , no peripheral edema, pulses intact, no cyanosis or clubbing.  GI:   Soft & nt; nml bowel sounds; no organomegaly or masses detected.  Musco: Warm bil, no deformities or joint swelling noted.   Neuro: alert, no focal deficits noted.    Skin: Warm, no lesions or rashes         Assessment  & Plan:

## 2011-02-14 NOTE — Assessment & Plan Note (Signed)
Compensated on present regimen Plan No changes Med reviewed with pt ed.

## 2011-02-14 NOTE — Assessment & Plan Note (Signed)
No active flare - off steroids since 04/2010 Patient's medications were reviewed today and patient education was given. Computerized medication calendar was adjusted/completed Plan Cont on current regimen

## 2011-02-17 ENCOUNTER — Other Ambulatory Visit: Payer: Self-pay | Admitting: *Deleted

## 2011-02-27 ENCOUNTER — Telehealth: Payer: Self-pay | Admitting: Family Medicine

## 2011-02-27 MED ORDER — LEVOTHYROXINE SODIUM 75 MCG PO TABS: 75.0000 ug | ORAL_TABLET | Freq: Every day | ORAL | Status: DC

## 2011-03-04 NOTE — Assessment & Plan Note (Signed)
[facility]                             PULMONARY OFFICE NOTE   NAME:[name] [name] GOEDEN                      MRN:          [id number]  DATE:09/13/2007                            DOB:          [birth year]    HISTORY OF PRESENT ILLNESS:  The patient is a 50 year old African  American female, patient of Dr. Sherene Sires, who has a known history of steroid-  dependent sarcoid, presents today for a one week followup.  Last visit,  the patient was having increased difficulties after a decrease in her  prednisone to every other day with increase arthralgias.  Last visit,  her prednisone was increased up to 10 mg daily.  Her ACE level had  increased from 122 to 189 on September 02, 2007.  CBC, sed rate, and C-  MET were all unremarkable.  The patient returns today reporting that she  is much improved, joint pain has decreased.  The patient has brought all  of her medications in today for review which are correct.   PAST MEDICAL HISTORY:  Reviewed.   CURRENT MEDICATIONS:  Reviewed.   PHYSICAL EXAMINATION:  GENERAL:  The patient is a pleasant female in no  acute distress.  VITAL SIGNS:  She is afebrile with stable vital signs.  O2 saturation is  97% on room air.  HEENT:  Unremarkable.  NECK:  Supple without adenopathy.  No JVD.  RESPIRATORY:  Lung sounds are clear.  CARDIAC:  Regular rate.  ABDOMEN:  Soft and nontender.  EXTREMITIES:  Warm without any edema.   IMPRESSION/PLAN:  1. Steroid dependent sarcoid with recent flare of arthralgias.  The      patient does seem to be improved with prednisone.  She has the      option of using ibuprofen as needed.  The patient is advised to      take prednisone and ibuprofen with food.  The patient will return      back with Dr. Sherene Sires in 6-8 weeks or sooner if needed.  2. Complex medication regimen.  The patient's medications were      reviewed in detail.  A computerized medication calendar was      completed for this patient  and reviewed in detail.     Rubye Oaks, NP  Electronically Signed      Charlaine Dalton. Sherene Sires, MD, [facility]  Electronically Signed   TP/MedQ  DD: 09/13/2007  DT: 09/13/2007  Job #: [id number]

## 2011-03-04 NOTE — Procedures (Signed)
NAME:  Lisa [name], Lisa [name] NO.:  [id number]   MEDICAL RECORD NO.:  [id number]          PATIENT TYPE:  OUT   LOCATION:  SLEEP CENTER                 FACILITY[facility]  [facility]   PHYSICIAN:  Barbaraann Share, MD,FCCPDATE OF BIRTH:  07-23-60   DATE OF STUDY:  06/23/2008                            NOCTURNAL POLYSOMNOGRAM   REFERRING PHYSICIAN:  [name], M.D.   INDICATION FOR STUDY:  Hypersomnia with sleep apnea.   EPWORTH SLEEPINESS SCORE:  5   MEDICATIONS:   SLEEP ARCHITECTURE:  Patient had total sleep time of 342 minutes with no  slow-wave sleep and only 31 minutes of REM.  Sleep onset latency was  normal at 26 minutes, and REM onset was prolonged at 136 minutes.  Sleep  efficiency was mildly reduced at 88%.   RESPIRATORY DATA:  The patient was found to have ten obstructive  hypopneas and no apneas for an apnea/hypopnea index of two events per  hour.  The events were not positional and there was loud snoring noted  throughout.  Patient did not meet split-night criteria, secondary to the  small numbers of events.   OXYGEN DATA:  There was O2 desaturation as low as 89% with the patient's  obstructive events.  This was transitory in nature.   CARDIAC DATA:  No clinically significant arrhythmia noted.   MOVEMENT-PARASOMNIA:  The patient was found to have no significant leg  jerks or other abnormal behaviors during the study.   IMPRESSIONS/RECOMMENDATIONS:  Small numbers of obstructive events which  do not meet the apnea/hypopnea index criteria for the obstructive sleep  apnea syndrome.  There was no significant oxygen desaturation noted.     Barbaraann Share, MD,FCCP  Diplomate, American Board of Sleep  Medicine  Electronically Signed    [facility]/MEDQ  D:  07/04/2008 15:32:55  T:  07/05/2008 11:40:37  Job:  [id number]

## 2011-03-04 NOTE — Assessment & Plan Note (Signed)
[facility]                             PULMONARY OFFICE NOTE   NAME:[name], [name]                      MRN:          [id number]  DATE:08/30/2007                            DOB:          1961-01-19    PULMONARY EXTENDED ACUTE OFFICE EVALUATION   HISTORY:  A 50 year old black female last seen here in March 2008 with  the recommend she try prednisone every-other day for steroid-dependent  sarcoid (prednisone had been initiated in January 2008 for arthralgias  mostly involving the ankles and hands) and return in 4 weeks for  followup chest x-ray which she failed to do.  She comes back now 6  months later not and is not really clear to what extent she improved  while on prednisone but over the last 2 weeks off of prednisone she had  noticed increasing hips, back and knee and ankle discomfort.  She has  noticed no increasing cough and has a general sensation of chest  congestion and tightness that dates back several months.   I could not actually get a straightforward answer from this patient  about her medications.  She gave my nurse a list of medicines but it is  clearly incomplete.  She has a difficult time processing what she  herself has written down on a 3 x 5 card as what her medications are and  what she is actually taking because she does not know the names of the  medicines.   However, she denies any increased dyspnea over baseline, new pattern  arthritis or rash, fevers, chills, sweats, unintended weight loss, or  significant dyspnea or ocular complaints.   PHYSICAL EXAMINATION:  She is a somewhat depressed and anxious  ambulatory black female who is very evasive in answering questions as  noted above.  She is afebrile with normal vital signs.  Weight of 206  pounds which is up almost 20 pounds from her previous visit.  HEENT:  Unremarkable, oropharynx clear.  NECK:  Supple without cervical adenopathy or tenderness, trachea is  midline  without thyromegaly.  LUNG FIELDS:  Completely clear bilaterally to auscultation and  percussion.  There is a regular rate and rhythm without murmur, gallop, rub.  ABDOMEN:  Soft, benign.  EXTREMITIES:  Warm without calf tenderness, cyanosis, clubbing, or  edema.   Lab studies today included a sed rate that was only 15, a CMET that  shows no increase in calcium or protein to albumin ratio, and normal CBC  with no eosinophils.   IMPRESSION:  No evidence of active sarcoid.  However, the aches that she  is having in her muscles and joints may be nothing more than what one  normally sees in steroid withdrawal.  Since the issue remains in doubt,  however, I am going to recommend starting back on prednisone at 10 mg  per day and using nonsteroidals p.r.n.  I am going to ask her in the  future if arthritis is the main thing driving her to take prednisone  that she seek Dr. Jeanine [name] help, and for symptoms that are not  clearly related to  joints I would be happy to see her to help her  titrate her prednisone down if not off.   The first thing we need to do, however, is do a full medication  reconciliation, acknowledging up-front that this patient has extremely  limited insight into her medicines and also has a difficult time  answering questions in a straightforward fashion making titration of  medications very difficult.  I spent almost 30 minutes with this patient  today using practical examples to help her understand how important it  is to first tell us in a straightforward fashion how she is doing  symptomatically, but then secondly be more forthcoming about what  medicines she is or is not taking so that in retrospect we can assess  response to therapy and make a better plan for her going forward.  Even  the concept of using 20/20 hindsight vision was something that seemed  foreign to her; when I told her have you ever heard the expression  hindsight is 20/20 she answered no.  This  makes it very difficult for  her to understand how we could actually help assess response or lack  thereof to any specific regimen we give her.   Also, missing followup appointments will be a recipe for poor care in  this patient who has such limited insight and therefore I only gave her  a limited amount of prednisone and will not be refilling unless she  returns to the office.  The alternative is for her to let Dr. Jimmy [name]  titrate her prednisone.     Lisa [name]. Lisa Sires, MD, [facility]  Electronically Signed    MBW/MedQ  DD: 08/30/2007  DT: 08/31/2007  Job #: [id number]   cc:   Lisa [name], M.D.  Lisa [name], M.D.

## 2011-03-06 NOTE — Telephone Encounter (Signed)
Medication sent via e-prescribing by [facility] . Documented in chart  02/27/11

## 2011-03-07 NOTE — Assessment & Plan Note (Signed)
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  She continues  to use 5 mg per day and says the only problems she has is with her  allergies.  As in the past, it is very difficult to figure out exactly  what she means by her allergies, since most of her symptoms relate to  cough in the past.  However, she denies any myalgias or arthralgias for  which prednisone initially was intended.   For a full inventory of medications, please see face sheet, dated January 11, 2007.   PHYSICAL EXAMINATION:  GENERAL:  She is a pleasant, ambulatory, black  female in no acute distress.  VITAL SIGNS:  She is afebrile with normal vital signs.  HEENT:  Unremarkable.  Oropharynx is clear.  LUNGS:  Fields clear bilaterally auscultation percussion.  HEART:  Regular rhythm without murmur, gallop, or rub.  ABDOMEN:  Soft, benign.  EXTREMITIES:  Warm without calf tenderness, cyanosis, clubbing, edema.   IMPRESSION:  Steroid responsive arthralgias and cough in a patient with  granulomatous hepatitis with marked elevation of ace level consistent  with systemic sarcoid, reflected on chest x-ray dated December 07, 2006,  which actually showed improvement while on prednisone.   At this point, it is difficult to assess a dose response curve for the  patient's prednisone use because she fails again to answer questions in  any kind of a straightforward fashion.  Instead of answering about  symptoms, she  has an already self diagnosed the problem.  For instance,  when I asked her about cough, she says, well it is my allergies making  me cough now.  The true way to find the lowest effective dose of  prednisone (which may in fact be zero) is to have her taper to every  other day dosing to see if any of her other symptoms, aches, myalgias,  arthralgias flare.  If so, she needs to go back to the previous dose  that was effective.   Followup will be in 4 weeks with a chest x-ray.     Charlaine Dalton. Sherene Sires, MD, [facility]  Electronically Signed   MBW/MedQ  DD: 01/11/2007  DT: 01/11/2007  Job #: [id number]   cc:   [name], M.D.

## 2011-03-07 NOTE — Assessment & Plan Note (Signed)
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  On her previous visit, she said she had her arthritis under  control with Advil, but I was concerned she was overusing Advil and had  asked her to see if she could reduce the amount of Advil it required to  control the arthritis.  She did so and states she does not take anymore than  8 to 12 Advil in a full week, with also improvement in previous bumps on  her arms (not clear this was actually sarcoid).   She denies any dyspnea but does have a cough that is productive of slightly  yellow sputum, especially in the morning, associated with nasal congestion  for which she has been using Claritin-D without much benefit.   PHYSICAL EXAMINATION:  GENERAL:  She is a somewhat depressed appearing,  ambulatory, black female in no acute distress.  VITAL SIGNS:  Stable.  Weight is 169 pounds, down 10 pounds from previous  visit.  HEENT:  Reveals mild nonspecific __________. Oropharynx is clear.  NECK:  Supple without cervical adenopathy or tenderness.  Trachea is  midline.  LUNGS:  Lung fields are clear bilaterally to auscultation and percussion.  HEART:  Regular rhythm without murmur, gallop, or rub.  ABDOMEN:  Soft, benign.  EXTREMITIES:  Warm without calf tenderness, cyanosis, clubbing, or edema.  SKIN:  No evidence of extensor nodules or any extensor rash that would be  typical of sarcoid.   Repeat sed rate and ACE level are pending.   IMPRESSION:  1. 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  Now that the arthritis      is better and the Advil use has dramatically dropped, we should see      evidence of decrease in both ACE level and sedimentation rate and these      were repeated.  2. She does have a mild, nonspecific rhinitis with purulent morning      secretions consistent with rhinitis/sinusitis.  This could actually      also be sarcoid but I do not think so based on the fact that her      systemic disease appears better.  I did treat her with Augmentin 875      b.i.d. for 10      days.  Asked her to continue to use Claritin-D p.r.n. plus Ocean Nasal      Spray.  Followup will be in 6 weeks with a chest x-ray, sooner p.r.n.  Charlaine Dalton. Sherene Sires, MD, [facility]   MBW/MedQ  DD:  07/06/2006  DT:  07/06/2006  Job #:  [id number]   cc:   [name], M.D.

## 2011-03-07 NOTE — Op Note (Signed)
NAME:  Lisa [name], Lisa [name]               ACCOUNT NO.:  [id number]   MEDICAL RECORD NO.:  [id number]          PATIENT TYPE:  INP   LOCATION:  9399                          FACILITY:  [facility]   PHYSICIAN:  Gerri Spore B. Earlene Plater, M.D.  DATE OF BIRTH:  Apr 14, 1961   DATE OF PROCEDURE:  04/16/2005  DATE OF DISCHARGE:                                 OPERATIVE REPORT   PREOPERATIVE DIAGNOSES:  1.  Abnormal bleeding.  2.  Fibroids.   POSTOPERATIVE DIAGNOSES:  1.  Abnormal bleeding.  2.  Fibroids.   PROCEDURE:  Laparoscopic supracervical hysterectomy.   FINDINGS:  Fibroid uterus, normal-appearing tubes and ovaries, irregular  contour of the liver.   SURGEON:  Chester Holstein. Earlene Plater, M.D.   ASSISTANT:  Lenoard Aden, M.D.   ANESTHESIA:  General.   SPECIMENS:  Uterus.   BLOOD LOSS:  Less than 100.   COMPLICATIONS:  None.   INDICATIONS:  The patient with a history of heavy menstrual bleeding not  responding to medical management.  Preoperative endometrial biopsy was  within normal limits.  Ultrasound suggestive of fibroid uterus.  No history  of abnormal paps.  The patient desired minimally invasive but definitive  surgical therapy.  She was advised of the 7% rate of occasional staining or  spotting and 1% rate of monthly menses.  Operative risks were discussed  including infection, bleeding, damage to surrounding organs, and potential  need to convert to other type of hysterectomy.   PROCEDURE IN DETAIL:  The patient was taken to the operating room and  general anesthesia obtained.  She was placed in [place] stirrups, prepped and  draped in a standard fashion and Foley catheter inserted to the bladder.  Sponge stick placed in the vagina.   A 10 mm incision was placed at the umbilicus, carried sharply to the fascia.  The fascia was divided sharply and elevated with Kocher clamps.  Posterior  sheath and peritoneum entered sharply, pursestring suture of 0 Vicryl placed  throughout the fascial  defect.  Hassan cannula inserted and secured.  Pneumoperitoneum obtained with CO2 gas.  Trendelenburg position obtained.  The pelvis and abdomen inspected with the above findings noted.  Of note,  the liver had white excrescences over its surface but otherwise appeared  normal.  Appropriate photographs were taken and referral will be made after  patient recovers from surgery in a week or so to general surgeons for  further recommendations, 11 mm ports were placed in each lower quadrant  under direct laparoscopic visualization.   The course of the left ureter identified.  The round ligament placed on  traction and divided with the Harmonic scalpel.  Tube and utero-ovarian  ligament were divided in similar manner.  The upper portion of the broad  ligament was clamped and divided with the Harmonic. Bladder flap developed  with the Harmonic, uterine artery skeletonized, and clamped and sealed and  divided with the Harmonic scalpel.  The entire procedure repeated on the  right side in the exact same manner.   The cervix was divided in a reverse cone fashion using the harmonic scalpel  per the [place] technique.  The morcellator was inserted through the left  lower quadrant port and the uterus morcellated in standard fashion.  All  fragments were removed.  The gas pressure was reduced and the lines of  dissection inspected and found to be hemostatic.  The cervical canal was  cauterized with the Harmonic scalpel on max power for 20 seconds.  Interceed  placed over the cervical stump.  The left fascial incision was closed with  laparoscopic suture passer.  The ports were removed and sites were  inspected.  They were hemostatic.  Gas was removed and Hassan cannula  removed.  The abdominal wall was elevated with the [employer] retractors and  the fascial defect closed with the previously placed pursestring suture.  The left lower quadrant port was re-inspected and still a defect was  palpable;  therefore, the edges were identified and grasped with Kocher  clamps, elevated, and a figure-of-eight suture of 0 Vicryl placed with  closure obtained.  The right lower quadrant port was inspected and had been  placed in a Z-tract fashion and no defect felt that was necessary to be  closed.  The subcutaneous tissues closed at each site with a stitch of 0  Vicryl and the skin closed with Dermabond.  The patient tolerated the  procedure without any complications.  She was taken to the recovery room,  awake, alert, and in stable condition.       WBD/MEDQ  D:  04/16/2005  T:  04/16/2005  Job:  [id number]

## 2011-03-07 NOTE — Assessment & Plan Note (Signed)
[facility]                             PULMONARY OFFICE NOTE   NAME:Lisa [name], Lisa [name]                      MRN:          [id number]  DATE:12/07/2006                            DOB:          Feb 22, 1961    A 50 year old black female with sarcoidosis documented, suspected since  2005 with evidence of __________ hepatitis by liver biopsy in June of  2006 for whom I had initiated prednisone on November 02, 2006 for  refractory symptoms of arthritis and cough.  She had reported previous  transient improvement on prednisone and therefore I started her back on  prednisone at 10 mg per day and she said that on this dose she had  complete resolution of her cough, as well as arthritis.  She denies any  ocular or articular complaints, fevers, chills, sweats, orthopnea, PND,  or leg swelling.   For full info on medications please see face sheet comment dated  December 07, 2006 corrected as listed.   PHYSICAL EXAMINATION:  She is a ambulatory black female who failed to  answer single question I asked her in a straightforward fashion.  VITAL SIGNS:  Stable.  HEENT:  Unremarkable.  PHARYNX:  Clear.  NECK:  Supple without cervical adenopathy, tenderness, trachea is  midline, no thyromegaly.  LUNGS:  Fields perfectly clear bilaterally to auscultation and  percussion.  Regular rhythm without murmur, gallop, rub.  ABDOMEN:  Soft, benign.  EXTREMITIES:  Warm without calf tenderness, cyanosis, clubbing, edema.   Chart review indicated an ACE level of 156 obtained July 06, 2006  which was repeated today for the purpose of comparison while on 10 mg  prednisone a day.   IMPRESSION:  Complete resolution of cough and arthritis while on 10 mg  of prednisone per day.  The challenge will therefore be to see what  minimum dose of prednisone is needed to control her symptoms and  suppress her angiotensin converting enzyme level.   I would caution everyone involved in  her care that this patient tends to  not answer questions that are asked and it is very difficult, if not  impossible, to get a handle on her subjective complaints, and therefore  the best we have in her case is her response to therapy, albeit very  vague, and monitoring angiotensin converting enzyme level.   I had emphasized to the patient previously that she should maintain on  proton pump inhibitor therapy and metoclopramide while trying to sort  through the differential of cough, she failed to  do this.  This emphasizes also the point that we need to make sure we  have full medication  reconciliation with each visit before making additional recommendations.  I have referred back to our nurse practitioner in 4 weeks for this  purpose.     Charlaine Dalton. Sherene Sires, MD, [facility]  Electronically Signed    MBW/MedQ  DD: 12/07/2006  DT: 12/08/2006  Job #: [id number]

## 2011-03-07 NOTE — Assessment & Plan Note (Signed)
[facility]                               PULMONARY OFFICE NOTE   NAME:[name], Lisa TRUMBO                      MRN:          [id number]  DATE:05/11/2006                            DOB:          1960/12/08    HISTORY:  A 50 year old black female with a history of arthralgias dating  back to 2005 but evidence of granulomatous hepatitis by liver biopsy June  2006, mostly complaining of arthritis that she had previously reported  seemed to respond to ibuprofen.  She returns today stating she has required  up to 12 ibuprofen a day to control arthritis pain.  When I asked her what  the average amount of Advil she needed on a day was, she said, Well, I take  12.  I am not sure she ever really understood the meaning of average, but I  later learned that she sometimes takes 1 or 2 and sometimes takes up to 12  but more of the time takes 12 than she does 2.   She denies any fevers, chills, sweats, significant increased dyspnea above  her baseline or cough over baseline.  Her previous complaint was coughing so  hard she would vomit after coughing, but this problem resolved on Prilosec  dosed daily.   PHYSICAL EXAMINATION:  GENERAL:  She is a slightly anxious black female who  had difficulty answering questions in a straightforward fashion.  VITAL SIGNS:  She had stable vital signs.  HEENT:  Unremarkable.  CHEST:  Lung fields clear bilaterally.  CARDIAC:  Regular rate and rhythm without murmur, gallop, or rub.  ABDOMEN:  Soft, benign.  EXTREMITIES:  Without calf tenderness, cyanosis or clubbing.   Previous ACE level was elevated at 116.   IMPRESSION:  Most likely the patient has sarcoid with both lung, liver,  lymph node and joint involvement with previous reports that she had  controlled the disease by using either Tylenol or Advil p.r.n.  It does  appear, although the history is quite difficult, that her arthritis is  flaring and that she might be a good  candidate to resume steroids.  However,  before I do that I recommended a restaging workup with a sedimentation rate,  ACE level, CMET and CBC.  The elevated ACE level is the equivalent of a  hemoglobin A1c in sarcoid and should help to adjust her prednisone dose,  but the degree of difficulty that I have coming to terms with the symptoms  that she is experiencing at baseline portends a poor outcome in terms of  understanding risks, benefits, and alternatives of the various approaches to  sarcoid management.  That is, I am not confident that she will be able to  manage the side effects of prednisone without significant morbidity.   I did arrange to see her back in 2 weeks, at which time I will have a chance  to review all the lab work with her.  In the meantime, I think it is fine  for her to use Advil up to 12 daily to control arthritis symptoms.  Charlaine Dalton. Lisa Sires, MD, [facility]   MBW/MedQ  DD:  05/11/2006  DT:  05/12/2006  Job #:  [id number]   cc:   [name], MD  [name], MD

## 2011-03-07 NOTE — Assessment & Plan Note (Signed)
[facility]                             PULMONARY OFFICE NOTE   NAME:Fuqua, VILDA ZOLLNER                      MRN:          [id number]  DATE:11/02/2006                            DOB:          January 23, 1961    PRIMARY SERVICE/PULMONARY EXTENDED FOLLOWUP VISIT:   HISTORY:  A 50 year old black female diagnosed with sarcoid in 2005,  presenting with severe arthralgias involving hands and ankles with a  biopsy of the liver showing classic granulomatous change.  She has had  chronic appearing changes on chest x-ray but gradual increase in sed  rate levels since her liver biopsy in June 2006.  On July 06, 2006,  I saw her and discussed with her the option of adding prednisone to  control her arthritis, but she declined feeling that the Advil was  doing the trick.  Since that time she has had at least 1 prednisone  Dosepak after presenting with a sinus infection associated with  increased cough.  She says she feels quite a bit better in all regards  while on prednisone but is still concerned about taking it.  Presently  she started another round of antibiotics without prednisone 3 days ago  and is only a little bit better but symptoms consisting of nasal  congestion with cough productive of slightly yellowish sputum.   For full inventory of medications please see face sheet, dated November 02, 2006.   EXAMINATION:  She is a pleasant ambulatory black female in acute  distress.  She has stable vital signs.  HEENT:  Is unremarkable.  Pharynx clear.  LUNGS:  Fields are completely clear bilaterally to auscultation and  percussion.  HEART:  Is regular rhythm without murmur, gallop or rub.  ABDOMEN:  Is soft, benign.  EXTREMITIES:  Without calf tenderness, cyanosis, clubbing or edema.   IMPRESSION:  Extended discussion with this patient lasted 15 to 20  minutes of a 25 minute visit on the following topics:   Clearly she has a very convincing response to  prednisone, both in terms  of controlling her nasal symptoms, her arthritis and also her general  level of fatigue that she is not controlling as well on Advil by  itself.  The issue with prednisone, of course, is risk, benefits and  alternatives in a disease that typically will burn itself out, or  resolve spontaneously within 3 years of onset.   I noticed the patient had the illness now for about 3 years and still on  her last ACE level.  On July 06, 2006 she had a level of 156.   I therefore gave her the options today and discussed all the  alternatives and we agreed to try the following:   Restart prednisone at 5 mg tablets 2 daily until 100% better, then 1  daily until she returns at the end of the month for repeat chest x-ray  and sed rate.  She should notice a marked decrease in the need for  NSAIDS and also her sinus flare-ups.     Charlaine Dalton. Sherene Sires, MD, [facility]  Electronically Signed  MBW/MedQ  DD: 11/02/2006  DT: 11/03/2006  Job #: [id number]   cc:   [name], M.D.

## 2011-03-07 NOTE — Assessment & Plan Note (Signed)
[facility]                               PULMONARY OFFICE NOTE   NAME:[name], Lisa PINERA                      MRN:          [id number]  DATE:05/25/2006                            DOB:          [birth year]    This is a 50 year old black female remote smoker with evidence of  granulomatous hepatitis by liver biopsy 2006 consistent with sarcoid  manifested mostly by arthritis which she says is under control using  Advil. I had a great deal of difficulty trying to elicit from her whether or  not the Advil was adequate control and understanding how much Advil she took  on an average day. I had the same problem today communicating with her  regarding the meaning of average. I approached this question several  different ways. 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 Her workup  on her last visit was significant for the fact that she had an elevated ACE  level to 134 and sed rate that had increased to 50.   However, she says she has everything under control, and denies any  dyspnea, ocular complaints, new skin problems, fevers, chills, sweats or  unexplained weight loss.   PHYSICAL EXAMINATION:  GENERAL:  She is a pleasant black female in no acute  distress.  VITAL SIGNS:  Stable.  HEENT:  Unremarkable. Oropharynx is clear.  LUNGS:  Lung fields are completely clear bilaterally to auscultation and  percussion.  HEART:  There is a regular rhythm without murmur, gallop or rub.  ABDOMEN:  Soft, benign.  EXTREMITIES:  Warm without any calf tenderness, cyanosis, clubbing or edema.   Chest x-ray from July 23 showed stable parenchymal lung disease and  adenopathy.   IMPRESSION:  1.  There is active sarcoid which she says is under control with Advil. I      am not sure this is true even with asking the question three or four      different ways today. I told her one way to track  whether the condition      is under control is how often she needs to refill her Advil bottle (I      was surprised to learn she had a bottle of 500 tablets today for      example. )   If it does turn out she is using Advil at very high doses to control the  arthritis, I think it would make more sense to place her on prednisone. I  told her that this would be the likely outcome.   However, since she says everything is under control, at present I am going  to recommend that she return here in six weeks or sooner if it requires any  more than 12 Advil a day to improve her arthralgias.                                   Charlaine Dalton.  Sherene Sires, MD, [facility]   MBW/MedQ  DD:  05/25/2006  DT:  05/26/2006  Job #:  [id number]   cc:   [name], MD

## 2011-03-10 ENCOUNTER — Telehealth: Payer: Self-pay | Admitting: Family Medicine

## 2011-03-10 NOTE — Telephone Encounter (Signed)
PER CHERI CALLED IN

## 2011-05-27 ENCOUNTER — Telehealth: Payer: Self-pay | Admitting: Family Medicine

## 2011-05-27 NOTE — Telephone Encounter (Signed)
Pt will pick up

## 2011-06-03 ENCOUNTER — Encounter: Payer: Self-pay | Admitting: Family Medicine

## 2011-06-04 ENCOUNTER — Encounter: Payer: Self-pay | Admitting: Family Medicine

## 2011-06-04 ENCOUNTER — Ambulatory Visit (INDEPENDENT_AMBULATORY_CARE_PROVIDER_SITE_OTHER): Payer: PRIVATE HEALTH INSURANCE | Admitting: Family Medicine

## 2011-06-04 VITALS — BP 112/72 | HR 72 | Ht 68.0 in | Wt 250.0 lb

## 2011-06-04 DIAGNOSIS — E039 Hypothyroidism, unspecified: Secondary | ICD-10-CM

## 2011-06-04 DIAGNOSIS — M545 Low back pain, unspecified: Secondary | ICD-10-CM

## 2011-06-04 DIAGNOSIS — I10 Essential (primary) hypertension: Secondary | ICD-10-CM

## 2011-06-04 DIAGNOSIS — E785 Hyperlipidemia, unspecified: Secondary | ICD-10-CM | POA: Insufficient documentation

## 2011-06-04 DIAGNOSIS — E559 Vitamin D deficiency, unspecified: Secondary | ICD-10-CM

## 2011-06-04 DIAGNOSIS — E7800 Pure hypercholesterolemia, unspecified: Secondary | ICD-10-CM

## 2011-06-04 DIAGNOSIS — R7301 Impaired fasting glucose: Secondary | ICD-10-CM | POA: Insufficient documentation

## 2011-06-04 MED ORDER — CYCLOBENZAPRINE HCL 10 MG PO TABS: 10.0000 mg | ORAL_TABLET | Freq: Two times a day (BID) | ORAL | Status: DC | PRN

## 2011-06-04 NOTE — Progress Notes (Signed)
Patient presents for a med check, requesting her annual labs.  Unfortunately she isn't fasting today, so will schedule fasting labs at a later date.  Hypertension follow-up:  Blood pressures aren't check elsewhere.  Denies dizziness, headaches, chest pain.  Denies side effects of medications. Hyperlipidemia follow-up:  Patient admits to not following a low-fat, low cholesterol diet. Doesn't eat red meat, but has a lot of fat in her diet. Compliant with medications and denies medication side effects. Has daily muscle aches, but thinks is related to sarcoidosis, not to her meds Hypothyroid follow-up: Occasionally feels like there is a lump in her throat when she swallows.  Also has some throat clearing, and some congestion over the last 2-3 days.  Feels like she might be getting a cold Impaired fasting glucose--she doesn't check her blood sugars at home  Does Zumba 3 times a week, and walks her dog 1/2 mile once a day  Has problems with low back pain, and chronic R leg numbness.  Sees Dr. Noel Gerold, and he prescribes Mobic and Lyrica.  She also takes cyclobenzaprine as needed, but she has gotten this from [facility] in the past.  Requesting refill today (last refill was 03/18/11 #30).  Increased back pain when sitting for a long time, and lying down at night.  Feels better after Zumba  Never got colonoscopy--was scheduled for November but out of pocket costs were to be $1300.  Past Medical History  Diagnosis Date  . Essential hypertension, benign   . Obesity, unspecified   . Sarcoidosis     pulmonary and hepatic; followed by Dr. Sherene Sires  . Esophageal reflux   . Unspecified hypothyroidism   . Anemia   . Unspecified vitamin D deficiency   . Low back pain     Dr. Noel Gerold  . Restless leg syndrome   . Impaired fasting glucose     02/2010--fglu 126, A1c 6.2  . Hyperlipidemia     Past Surgical History  Procedure Date  . Abdominal hysterectomy 03/2005    Dr. Earlene Plater    History   Social History  . Marital  Status: Married    Spouse Name: N/A    Number of Children: N/A  . Years of Education: N/A   Occupational History  . Unemployed    Social History Main Topics  . Smoking status: Former Smoker -- 0.5 packs/day for 20 years    Types: Cigarettes    Quit date: 10/20/2004  . Smokeless tobacco: Never Used  . Alcohol Use: Yes     occassional  . Drug Use: No  . Sexually Active: Not on file   Other Topics Concern  . Not on file   Social History Narrative  . No narrative on file    Family History  Problem Relation Age of Onset  . Cancer Mother     bladder  . Cancer Father 33    colon cancer  . Diabetes Maternal Grandfather     Current outpatient prescriptions:albuterol (PROAIR HFA) 108 (90 BASE) MCG/ACT inhaler, Inhale 2 puffs into the lungs every 3 (three) hours as needed., Disp: , Rfl: ;  aspirin-acetaminophen-caffeine (EXCEDRIN MIGRAINE) 250-250-65 MG per tablet, Per bottle as needed for headache, Disp: , Rfl: ;  budesonide-formoterol (SYMBICORT) 160-4.5 MCG/ACT inhaler, Inhale 2 puffs into the lungs 2 (two) times daily.  , Disp: , Rfl:  Calcium Carbonate-Vitamin D (CALCIUM 600+D) 600-400 MG-UNIT per tablet, Take 1 tablet by mouth 2 (two) times daily., Disp: , Rfl: ;  cetirizine (ZYRTEC) 10 MG tablet,  Take 10 mg by mouth daily as needed.  , Disp: , Rfl: ;  Cholecalciferol (VITAMIN D3) 1000 UNITS CAPS, Take 1 capsule by mouth daily.  , Disp: , Rfl:  cyclobenzaprine (FLEXERIL) 10 MG tablet, Take 1 tablet (10 mg total) by mouth 2 (two) times daily as needed for muscle spasms., Disp: 30 tablet, Rfl: 2;  cycloSPORINE (RESTASIS) 0.05 % ophthalmic emulsion, Place 1 drop into both eyes 2 (two) times daily.  , Disp: , Rfl: ;  ferrous sulfate 324 (65 FE) MG TBEC, Take 1 tablet by mouth daily.  , Disp: , Rfl:  fluticasone (FLONASE) 50 MCG/ACT nasal spray, 2 sprays by Nasal route 2 (two) times daily as needed., Disp: , Rfl: ;  levothyroxine (SYNTHROID, LEVOTHROID) 75 MCG tablet, Take 1 tablet (75 mcg  total) by mouth daily., Disp: 30 tablet, Rfl: 5;  meloxicam (MOBIC) 15 MG tablet, Take 15 mg by mouth daily.  , Disp: , Rfl: ;  metFORMIN (GLUMETZA) 500 MG (MOD) 24 hr tablet, Take 500 mg by mouth daily with supper.  , Disp: , Rfl:  Multiple Vitamins-Minerals (CENTRUM SILVER) tablet, Take by mouth daily., Disp: , Rfl: ;  olmesartan-hydrochlorothiazide (BENICAR HCT) 20-12.5 MG per tablet, Take 1 tablet by mouth daily. #28 samples given, Disp: , Rfl: ;  pantoprazole (PROTONIX) 40 MG tablet, Take 40 mg by mouth daily. 30 minutes before first meal , Disp: , Rfl: ;  Polyethyl Glycol-Propyl Glycol (SYSTANE) 0.4-0.3 % SOLN, Apply 1 drop to eye daily as needed.  , Disp: , Rfl:  pravastatin (PRAVACHOL) 20 MG tablet, Take 20 mg by mouth daily.  , Disp: , Rfl: ;  pregabalin (LYRICA) 75 MG capsule, Take 75 mg by mouth 2 (two) times daily.  , Disp: , Rfl: ;  rOPINIRole (REQUIP) 0.5 MG tablet, Take 1/2-2 tablets at bedtime , Disp: , Rfl: ;  zolpidem (AMBIEN) 10 MG tablet, Take 10 mg by mouth at bedtime as needed.  , Disp: , Rfl:  dextromethorphan-guaiFENesin (MUCINEX DM) 30-600 MG per 12 hr tablet, 1-2 tabs by mouth every 12 hours as needed for cough, Disp: , Rfl: ;  promethazine-codeine (PHENERGAN WITH CODEINE) 6.25-10 MG/5ML syrup, 1-2 teaspoons by mouth every 4-6 hours as needed for severe cough, Disp: , Rfl:   Allergies  Allergen Reactions  . Bupropion Hcl Hives   ROS:  Denies fevers, headaches, dizziness, chest pain, swelling, skin rashes.  +mild congestion/URI.  Denies GI complaints.  +back pain and leg numbness  PHYSICAL EXAM: BP 112/72  Pulse 72  Ht 5\' 8"  (1.727 m)  Wt 250 lb (113.399 kg)  BMI 38.01 kg/m2 HEENT: PERRL, EOMI, conjunctiva clear.  TM's and EAC's normal. OP mild cobblestoning. Sinuses nontender.  Nasal mucosa mildly edematous, no purulence or erythema Neck: no lymphadenopathy or thyromegaly or mass Heart: regular rate and rhythm without murmur Lungs: clear bilaterally Abdomen: soft, no  organomegaly or mass, nontender Extremities: no clubbing, or edema Back: Tender at lower back (bilateraly SI joints and lower lumbar spine).  No CVA tenderness Neuro: alert and oriented x 3, normal gait, cranial nerves grossly intact  ASSESSMENT/PLAN: 1. HYPOTHYROIDISM  TSH  2. HYPERTENSION  Comprehensive metabolic panel   well controlled  3. Impaired fasting glucose  Hemoglobin A1c   due for fasting labs.  Encouraged weight loss, daily exercise, portion control and healthy diet  4. Pure hypercholesterolemia  Lipid panel  5. Lumbago  cyclobenzaprine (FLEXERIL) 10 MG tablet   stable.  May eventuall need surgery, per pts report.  continue to follow with Dr. Noel Gerold (flexeril Rx'd by [place] though)  6. Unspecified vitamin D deficiency  Vitamin D 25 hydroxy   Possible early URI. Restart Mucinex. Continue other current medications (Flonase, antihistamines) Discussed proper diet (low carb, low fat), daily exercise and weight loss  HM--check with insurance regarding colonoscopy--if another provider/location would be less expensive

## 2011-06-04 NOTE — Patient Instructions (Signed)
Check with your insurance to see if there are other locations that could do your colonoscopy for less money. You may have the start of a cold--restart the mucinex to help with the postnasal drip  Return in the next few days for fasting labs

## 2011-06-05 ENCOUNTER — Other Ambulatory Visit: Payer: PRIVATE HEALTH INSURANCE

## 2011-06-05 DIAGNOSIS — E7800 Pure hypercholesterolemia, unspecified: Secondary | ICD-10-CM

## 2011-06-05 DIAGNOSIS — E559 Vitamin D deficiency, unspecified: Secondary | ICD-10-CM

## 2011-06-05 DIAGNOSIS — E039 Hypothyroidism, unspecified: Secondary | ICD-10-CM

## 2011-06-05 DIAGNOSIS — I10 Essential (primary) hypertension: Secondary | ICD-10-CM

## 2011-06-05 LAB — COMPREHENSIVE METABOLIC PANEL
ALT: 21 U/L (ref 0–35)
AST: 21 U/L (ref 0–37)
Albumin: 4.3 g/dL (ref 3.5–5.2)
BUN: 13 mg/dL (ref 6–23)
CO2: 28 mEq/L (ref 19–32)
Calcium: 9.6 mg/dL (ref 8.4–10.5)
Chloride: 104 mEq/L (ref 96–112)
Creat: 0.81 mg/dL (ref 0.50–1.10)
Potassium: 4.5 mEq/L (ref 3.5–5.3)

## 2011-06-05 LAB — TSH: TSH: 4.149 u[IU]/mL (ref 0.350–4.500)

## 2011-06-05 LAB — LIPID PANEL
HDL: 39 mg/dL — ABNORMAL LOW (ref >39)
LDL Cholesterol: 109 mg/dL — ABNORMAL HIGH (ref 0–99)

## 2011-06-06 LAB — VITAMIN D 25 HYDROXY (VIT D DEFICIENCY, FRACTURES): Vit D, 25-Hydroxy: 46 ng/mL (ref 30–89)

## 2011-06-09 ENCOUNTER — Telehealth: Payer: Self-pay | Admitting: *Deleted

## 2011-06-09 NOTE — Telephone Encounter (Signed)
Left message with patient's husband to call me for lab results.

## 2011-06-11 ENCOUNTER — Telehealth: Payer: Self-pay | Admitting: *Deleted

## 2011-06-11 ENCOUNTER — Other Ambulatory Visit (INDEPENDENT_AMBULATORY_CARE_PROVIDER_SITE_OTHER): Payer: PRIVATE HEALTH INSURANCE

## 2011-06-11 DIAGNOSIS — E7800 Pure hypercholesterolemia, unspecified: Secondary | ICD-10-CM

## 2011-06-11 DIAGNOSIS — E119 Type 2 diabetes mellitus without complications: Secondary | ICD-10-CM

## 2011-06-11 LAB — POCT GLYCOSYLATED HEMOGLOBIN (HGB A1C): Hemoglobin A1C: 5.7

## 2011-06-11 NOTE — Telephone Encounter (Signed)
Spoke with patient regarding her labs, she is requesting that you increase her pravastatin 20mg  instead of the lowfat/low cholesterol diet (you stated that you would do either) and she scheduled for a 3 mo lipid/liver check 09/19/2011 and is scheduled to see you in Feb 2013. Her pharmacy is [facility]. Also she is coming in now to do the [facility], results to follow. Thanks.

## 2011-06-12 MED ORDER — PRAVASTATIN SODIUM 40 MG PO TABS: 40.0000 mg | ORAL_TABLET | Freq: Every day | ORAL | Status: DC

## 2011-06-12 NOTE — Telephone Encounter (Signed)
Addended by: Joselyn Arrow on: 06/12/2011 09:43 AM   Modules accepted: Orders

## 2011-06-12 NOTE — Telephone Encounter (Signed)
Spoke with patient and notified her that rx was called in and that her A1C was 5.7, very good!

## 2011-06-12 NOTE — Telephone Encounter (Signed)
Advise pt that rx was sent to pharmacy. A1c results were very good! (5.7)

## 2011-06-13 ENCOUNTER — Telehealth: Payer: Self-pay | Admitting: Internal Medicine

## 2011-06-13 NOTE — Telephone Encounter (Signed)
Samples at front. Pt aware. Meghanne Pletz, CMA  

## 2011-06-20 ENCOUNTER — Ambulatory Visit: Payer: PRIVATE HEALTH INSURANCE | Admitting: Internal Medicine

## 2011-06-20 ENCOUNTER — Telehealth: Payer: Self-pay | Admitting: Internal Medicine

## 2011-06-20 NOTE — Telephone Encounter (Signed)
Spoke with pt and she states normally she is giving 4 samples of benicar 10/12.5 and was only given 2. Pt states she needs 2 mors samples to get her til her next OV. Pt aware 2 has been left upfront

## 2011-07-09 ENCOUNTER — Other Ambulatory Visit: Payer: Self-pay | Admitting: Family Medicine

## 2011-07-09 ENCOUNTER — Telehealth: Payer: Self-pay | Admitting: Family Medicine

## 2011-07-09 DIAGNOSIS — Z1231 Encounter for screening mammogram for malignant neoplasm of breast: Secondary | ICD-10-CM

## 2011-07-09 NOTE — Telephone Encounter (Signed)
This was sent to me. Is this okay to refill? Thanks.

## 2011-07-09 NOTE — Telephone Encounter (Signed)
Okay for #30 with 1 refill.  Please call into [facility].  Thanks

## 2011-07-10 ENCOUNTER — Other Ambulatory Visit: Payer: Self-pay | Admitting: *Deleted

## 2011-07-10 DIAGNOSIS — G4700 Insomnia, unspecified: Secondary | ICD-10-CM

## 2011-07-10 MED ORDER — ZOLPIDEM TARTRATE 10 MG PO TABS: 10.0000 mg | ORAL_TABLET | Freq: Every evening | ORAL | Status: DC | PRN

## 2011-07-25 ENCOUNTER — Encounter: Payer: Self-pay | Admitting: Internal Medicine

## 2011-07-25 ENCOUNTER — Ambulatory Visit (INDEPENDENT_AMBULATORY_CARE_PROVIDER_SITE_OTHER)
Admission: RE | Admit: 2011-07-25 | Discharge: 2011-07-25 | Disposition: A | Discharge status: Home / Self Care | Payer: PRIVATE HEALTH INSURANCE | Source: Ambulatory Visit | Attending: Internal Medicine | Admitting: Internal Medicine

## 2011-07-25 ENCOUNTER — Ambulatory Visit (INDEPENDENT_AMBULATORY_CARE_PROVIDER_SITE_OTHER): Payer: PRIVATE HEALTH INSURANCE | Admitting: Internal Medicine

## 2011-07-25 VITALS — BP 130/76 | HR 60 | Temp 97.9°F | Ht 68.0 in | Wt 253.0 lb

## 2011-07-25 DIAGNOSIS — D869 Sarcoidosis, unspecified: Secondary | ICD-10-CM

## 2011-07-25 NOTE — Patient Instructions (Signed)
See calendar for specific medication instructions and bring it back for each and every office visit for every healthcare provider you see.  Without it,  you may not receive the best quality medical care that we feel you deserve.  You will note that the calendar groups together  your maintenance  medications that are timed at particular times of the day.  Think of this as your checklist for what your doctor has instructed you to do until your next evaluation to see what benefit  there is  to staying on a consistent group of medications intended to keep you well.  The other group at the bottom is entirely up to you to use as you see fit  for specific symptoms that may arise between visits that require you to treat them on an as needed basis.  Think of this as your action plan or "what if" list.   Separating the top medications from the bottom group is fundamental to providing you adequate care going forward.    Follow up here every 6 months, sooner if needed

## 2011-07-25 NOTE — Progress Notes (Signed)
Subjective:    Patient ID: Lisa [name], female    DOB: Jan 09, 1961, 50 y.o.   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 Each time she lowers the dose in the past she has noticed a flare of her arthritis an does not respond to maximum doses of ibuprofen.    March 27, 2010 6 wk followup. Pt c/o cough x 3 wks. She states that it only happens at night when lies down. She states that cough is dry. She states that she has been postponing her pm dose of the symbicort 80 for when the cough occurs and this seems to help. no sob. arthritis ok on 5 mg every other day dosing. no ocular c/o or rash. rec try taper to q 3 d dosing and increase symbicort to 160 2bid   May 08, 2010 ov 6 wk followup. Pt states that her cough is better. Still has some cough in the am- prod with thick yellow sputum. She states had some itchy rash on her left leg a wk ago but this has resolved. actually on 2.5 mg q3d with no complaints on the days she misses pred dosing. >>Prednisone stopped.   July 04, 2010--Presents for est med calendar - pt brought all meds with her today. no new complaints. Last visit weaned off steroids. No flare since last vist. We reviewed her meds and updated her med calendar. . Got her flu shot yesterday. no change rx   October 04, 2010 ov no chang cc doe but hacky dry cough worse, feels like throat is irritated ? from symbicort esp in am. Change the way you use symbicort and only 12 hours if breathing difficulty or wheezing  Add pepcid 20 mg one at bedtime  Try chlortrimeton 4 mg one at bedtime   December 04, 2010 ov all better, rare need for symbicort, all smiles, no cough or sob. >>no changes   02/14/2011 follow up and med review Pt returns for follow up and med review. She is doing well since last visit . No flare in cough or dyspena.  Rare use  of ProAir.  Uses Symbicort As needed  . Rare use. No significant drainage or tickle in throat. Has  Stopped clortrimeton. Has zyrtec but not using it.   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 rec No change rx, follow the calendar provided    07/25/2011 f/u ov/Lisa [name] cc  Pt states no resp c/o's. She has had itchy  rash underneath arm x 6 wks, improving now. Did not bring med calendar - arthritis controlled with otcs.  No ocular co's no cough no sob  Sleeping ok without nocturnal  or early am exacerbation  of respiratory  c/o's or need for noct saba. Also denies any obvious fluctuation of symptoms with weather or environmental changes or other aggravating or alleviating factors except as outlined above   ROS  At present neg for  any significant sore throat, dysphagia, itching, sneezing,  nasal congestion or excess/ purulent secretions,  fever, chills, sweats, unintended wt loss, pleuritic or exertional cp, hempoptysis, orthopnea pnd or leg swelling.  Also denies presyncope, palpitations, heartburn, abdominal pain, nausea, vomiting, diarrhea  or change in bowel or urinary habits, dysuria,hematuria,  rash, arthralgias, visual complaints, headache, numbness weakness or ataxia.  Objective:   Physical Exam  Wt 260 > 253 07/25/2011  GEN: A/Ox3; pleasant , NAD, well nourished , obese  HEENT:  [place]/AT,  EACs-clear, TMs-wnl, NOSE-clear, THROAT-clear, no lesions, no postnasal drip or exudate noted.   NECK:  Supple w/ fair ROM; no JVD; normal carotid impulses w/o bruits; no thyromegaly or nodules palpated; no lymphadenopathy.  RESP  Coarse BS w/ no wheezing.    CARD:  RRR, no m/r/g  , no peripheral edema, pulses intact, no cyanosis or clubbing.  GI:   Soft & nt; nml bowel sounds; no organomegaly or masses detected.  Musco: Warm bil, no deformities or joint swelling noted.   Neuro: alert, no focal deficits noted.    Skin: Warm, no lesions or rashes        CXR  07/25/2011 :  Comparison: 09/06/2010  Findings: Heart size is normal.  No pleural effusion or pulmonary edema.  Left upper lobe scarring and architectural distortion appears similar to the previous examination.  No new pulmonary parenchymal abnormalities identified.  The hilar and mediastinal contours are within normal limits.  IMPRESSION:  1. Stable exam.    Assessment & Plan:

## 2011-07-25 NOTE — Assessment & Plan Note (Signed)
No evidence of active dz  See instructions for specific recommendations which were reviewed directly with the patient who was given a copy with highlighter outlining the key components.  zx

## 2011-08-01 ENCOUNTER — Encounter: Payer: Self-pay | Admitting: Family Medicine

## 2011-08-01 ENCOUNTER — Ambulatory Visit (INDEPENDENT_AMBULATORY_CARE_PROVIDER_SITE_OTHER): Payer: PRIVATE HEALTH INSURANCE | Admitting: Family Medicine

## 2011-08-01 ENCOUNTER — Other Ambulatory Visit: Payer: Self-pay | Admitting: Family Medicine

## 2011-08-01 VITALS — BP 120/80 | HR 70 | Wt 253.0 lb

## 2011-08-01 DIAGNOSIS — L989 Disorder of the skin and subcutaneous tissue, unspecified: Secondary | ICD-10-CM

## 2011-08-01 DIAGNOSIS — L858 Other specified epidermal thickening: Secondary | ICD-10-CM

## 2011-08-01 DIAGNOSIS — Z23 Encounter for immunization: Secondary | ICD-10-CM

## 2011-08-01 NOTE — Progress Notes (Signed)
  Subjective:    Patient ID: Lisa [name], female    DOB: 1961-09-08, 50 y.o.   MRN: [id number]  HPI She is here for excision of lesion on her mid chest area.   Review of Systems     Objective:   Physical Exam A 0.5 cm raised hard lesion is noted       Assessment & Plan:  Cutaneous horn The lesion was injected with Xylocaine. Was excised without difficulty and the base hyfrecated. Flu shot also given.

## 2011-08-15 ENCOUNTER — Ambulatory Visit
Admission: RE | Admit: 2011-08-15 | Discharge: 2011-08-15 | Disposition: A | Discharge status: Home / Self Care | Payer: PRIVATE HEALTH INSURANCE | Source: Ambulatory Visit | Attending: Family Medicine | Admitting: Family Medicine

## 2011-08-15 DIAGNOSIS — Z1231 Encounter for screening mammogram for malignant neoplasm of breast: Secondary | ICD-10-CM

## 2011-09-02 ENCOUNTER — Telehealth: Payer: Self-pay | Admitting: Internal Medicine

## 2011-09-02 NOTE — Telephone Encounter (Signed)
We have some go ahead and given to them

## 2011-09-02 NOTE — Telephone Encounter (Signed)
Gave #28 to both [name] and grandmother Lisa [name] as samples

## 2011-09-02 NOTE — Telephone Encounter (Signed)
Pt called for both her and her grandmother Lisa [name] DOB: [birth year]. She would like to know if she can get benicar samples for her and her grandmother

## 2011-09-19 ENCOUNTER — Other Ambulatory Visit: Payer: PRIVATE HEALTH INSURANCE

## 2011-09-19 DIAGNOSIS — I10 Essential (primary) hypertension: Secondary | ICD-10-CM

## 2011-09-19 DIAGNOSIS — R7301 Impaired fasting glucose: Secondary | ICD-10-CM

## 2011-09-20 LAB — LIPID PANEL
LDL Cholesterol: 71 mg/dL (ref 0–99)
Triglycerides: 110 mg/dL (ref <150)
VLDL: 22 mg/dL (ref 0–40)

## 2011-09-20 LAB — HEPATIC FUNCTION PANEL
Alkaline Phosphatase: 67 U/L (ref 39–117)
Bilirubin, Direct: 0.2 mg/dL (ref 0.0–0.3)
Indirect Bilirubin: 0.3 mg/dL (ref 0.0–0.9)
Total Protein: 6.6 g/dL (ref 6.0–8.3)

## 2011-09-20 LAB — HEMOGLOBIN A1C: Hgb A1c MFr Bld: 5.8 % — ABNORMAL HIGH (ref <5.7)

## 2011-09-23 ENCOUNTER — Encounter: Payer: Self-pay | Admitting: Family Medicine

## 2011-10-03 ENCOUNTER — Telehealth: Payer: Self-pay | Admitting: Internal Medicine

## 2011-10-03 MED ORDER — METFORMIN HCL ER (MOD) 500 MG PO TB24: 500.0000 mg | ORAL_TABLET | Freq: Every day | ORAL | Status: DC

## 2011-10-03 NOTE — Telephone Encounter (Signed)
Med sent in.

## 2011-10-10 ENCOUNTER — Telehealth: Payer: Self-pay | Admitting: Internal Medicine

## 2011-10-10 NOTE — Telephone Encounter (Signed)
Called pt to let them know samples were available for pick up after lunch

## 2011-10-10 NOTE — Telephone Encounter (Signed)
Dr. Jones Bales it verbally and said he wants to cut back on giving samples. Giving them both #35 (5 boxes)

## 2011-11-18 ENCOUNTER — Telehealth: Payer: Self-pay | Admitting: Family Medicine

## 2011-11-19 ENCOUNTER — Other Ambulatory Visit: Payer: Self-pay | Admitting: *Deleted

## 2011-11-19 DIAGNOSIS — E7800 Pure hypercholesterolemia, unspecified: Secondary | ICD-10-CM

## 2011-11-19 MED ORDER — PRAVASTATIN SODIUM 40 MG PO TABS: 40.0000 mg | ORAL_TABLET | Freq: Every day | ORAL | Status: DC

## 2011-11-20 ENCOUNTER — Telehealth: Payer: Self-pay | Admitting: Internal Medicine

## 2011-11-20 NOTE — Telephone Encounter (Signed)
TSD  

## 2011-11-20 NOTE — Telephone Encounter (Signed)
Pt notified to pick up samples of benicar 20-12.5mg  for her and her grandmother elnora curry. Annel said she will pick up 11/21/11

## 2011-11-21 ENCOUNTER — Other Ambulatory Visit: Payer: Self-pay | Admitting: Family Medicine

## 2011-11-21 NOTE — Telephone Encounter (Signed)
Patient needs to schedule an office visit

## 2011-11-27 ENCOUNTER — Encounter: Payer: Self-pay | Admitting: Internal Medicine

## 2011-12-03 ENCOUNTER — Ambulatory Visit: Payer: PRIVATE HEALTH INSURANCE | Admitting: Family Medicine

## 2011-12-12 ENCOUNTER — Ambulatory Visit (INDEPENDENT_AMBULATORY_CARE_PROVIDER_SITE_OTHER): Payer: PRIVATE HEALTH INSURANCE | Admitting: Family Medicine

## 2011-12-12 ENCOUNTER — Encounter: Payer: Self-pay | Admitting: Family Medicine

## 2011-12-12 VITALS — BP 130/84 | HR 72 | Ht 67.0 in | Wt 250.0 lb

## 2011-12-12 DIAGNOSIS — Z0000 Encounter for general adult medical examination without abnormal findings: Secondary | ICD-10-CM

## 2011-12-12 DIAGNOSIS — R7301 Impaired fasting glucose: Secondary | ICD-10-CM

## 2011-12-12 DIAGNOSIS — R195 Other fecal abnormalities: Secondary | ICD-10-CM

## 2011-12-12 DIAGNOSIS — R300 Dysuria: Secondary | ICD-10-CM

## 2011-12-12 DIAGNOSIS — Z79899 Other long term (current) drug therapy: Secondary | ICD-10-CM

## 2011-12-12 DIAGNOSIS — I10 Essential (primary) hypertension: Secondary | ICD-10-CM

## 2011-12-12 DIAGNOSIS — E039 Hypothyroidism, unspecified: Secondary | ICD-10-CM

## 2011-12-12 DIAGNOSIS — K219 Gastro-esophageal reflux disease without esophagitis: Secondary | ICD-10-CM

## 2011-12-12 DIAGNOSIS — E669 Obesity, unspecified: Secondary | ICD-10-CM

## 2011-12-12 DIAGNOSIS — E7800 Pure hypercholesterolemia, unspecified: Secondary | ICD-10-CM

## 2011-12-12 LAB — CBC WITH DIFFERENTIAL/PLATELET
Eosinophils Absolute: 0.2 10*3/uL (ref 0.0–0.7)
Eosinophils Relative: 3 % (ref 0–5)
HCT: 42.6 % (ref 36.0–46.0)
Hemoglobin: 13.7 g/dL (ref 12.0–15.0)
Lymphs Abs: 2.2 10*3/uL (ref 0.7–4.0)
MCH: 29 pg (ref 26.0–34.0)
MCHC: 32.2 g/dL (ref 30.0–36.0)
MCV: 90.3 fL (ref 78.0–100.0)
Monocytes Absolute: 0.4 10*3/uL (ref 0.1–1.0)
Monocytes Relative: 6 % (ref 3–12)
RBC: 4.72 MIL/uL (ref 3.87–5.11)

## 2011-12-12 LAB — POCT URINALYSIS DIPSTICK
Ketones, UA: NEGATIVE
Spec Grav, UA: 1.02
Urobilinogen, UA: NEGATIVE
pH, UA: 6

## 2011-12-12 NOTE — Patient Instructions (Addendum)
HEALTH MAINTENANCE RECOMMENDATIONS:  It is recommended that you get at least 30 minutes of aerobic exercise at least 5 days/week (for weight loss, you may need as much as 60-90 minutes). This can be any activity that gets your heart rate up. This can be divided in 10-15 minute intervals if needed, but try and build up your endurance at least once a week.  Weight bearing exercise is also recommended twice weekly.  Eat a healthy diet with lots of vegetables, fruits and fiber.  "Colorful" foods have a lot of vitamins (ie green vegetables, tomatoes, red peppers, etc).  Limit sweet tea, regular sodas and alcoholic beverages, all of which has a lot of calories and sugar.  Up to 1 alcoholic drink daily may be beneficial for women (unless trying to lose weight, watch sugars).  Drink a lot of water.  Calcium recommendations are 1200-1500 mg daily (1500 mg for postmenopausal women or women without ovaries), and vitamin D 1000 IU daily.  This should be obtained from diet and/or supplements (vitamins), and calcium should not be taken all at once, but in divided doses.  Monthly self breast exams and yearly mammograms for women over the age of 27 is recommended.  Sunscreen of at least SPF 30 should be used on all sun-exposed parts of the skin when outside between the hours of 10 am and 4 pm (not just when at beach or pool, but even with exercise, golf, tennis, and yard work!)  Use a sunscreen that says "broad spectrum" so it covers both UVA and UVB rays, and make sure to reapply every 1-2 hours.  Remember to change the batteries in your smoke detectors when changing your clock times in the spring and fall.  Use your seat belt every time you are in a car, and please drive safely and not be distracted with cell phones and texting while driving.  Call us if your hip pain is worsening/persisting, for an order to get x-rays of your hip.  Pain may be coming from your back.  If hip x-rays look okay, then follow up with  Dr. Wells Guiles office regarding treatment (ie, might benefit from PT).  Please send back the stool cards.  If you continue to have blood in your stool, then it is very important you follow up with the gastroenterologist for evaluation, and repeat colonoscopy that is past due.

## 2011-12-12 NOTE — Progress Notes (Signed)
Lisa [name] is a 51 y.o. female who presents for a complete physical.  She has the following concerns:  Urine dip showed 1+ for leuks, pt stated she had e coli found in her culture last Feb-sometime she experiences some sharp pain hen she urinates that does come and go. Also she stated that she has a sharp pain L hip area that she would like you to evaluate if time permits.  Sarcoidosis--under the care of Dr. Sherene Sires.  Some dyspnea on exertion, mild, not requring rescue inhaler. Doing well overall.  Hypertension follow-up:  Blood pressures are not checked elsewhere.  Denies dizziness, headaches, chest pain, palpitations.  Denies swelling, rarely in L leg, comes and goes.  Denies side effects of medications.  Hyperlipidemia follow-up:  Patient has cut back on butter, doesn't eat much red meat.  Cut back to 3 eggs/week. Cheese with her eggs, cut back on cheese elsewhere in diet. Compliant with medications and denies medication side effects. Dose had been increased to 40 mg of pravastatin, and re-check of lipids in November was improved, but HDL still low. Not getting any exercise.  GERD:  Flares with certain foods, meatloaf or other spicy foods.  Takes PPI daily, and has heartburn if she misses pills.  Denies dysphagia.  Hypothyroidism:  +hair loss when she combs her hair. +fatigue, but relates that to full time school and job. +constipation (once a week)  Impaired fasting glucose--doesn't check sugars.  Husband had a machine, but she won't let him check her sugar.  Denies polydipsia, polyuria.  Denies side effects from metformin.  Back pain--sees Dr. Sharolyn Douglas (who prescribes lyrica, flexeril and meloxicam).  They do not monitor labs.  She declines any surgery. Now also complaining of some L hip pain.  L Hip pain x 2 months.  Doesn't radiate down leg.  Hurts at rest, but worse with activity. Sometimes feels swollen. Takes mobic daily for back, doesn't seem to help with hip pain. Hurts in the groin,  and laterally.  Health Maintenance:  Immunization History  Administered Date(s) Administered  . Influenza Split 08/01/2011  . Influenza Whole 07/20/2008, 08/01/2009, 06/28/2010   Last Pap smear: s/p hysterectomy Last mammogram: 07/2011 Last colonoscopy: was due 08/2011, but couldn't afford Last DEXA: 07/2008--normal Dentist: twice yearly Ophtho: once yearly Exercise: None  Past Medical History  Diagnosis Date  . Essential hypertension, benign   . Obesity, unspecified   . Sarcoidosis     pulmonary and hepatic; followed by Dr. Sherene Sires  . Esophageal reflux   . Unspecified hypothyroidism   . Anemia   . Unspecified vitamin D deficiency     resolved  . Low back pain     Dr. Noel Gerold  . Restless leg syndrome   . Impaired fasting glucose     02/2010--fglu 126, A1c 6.2  . Hyperlipidemia     Past Surgical History  Procedure Date  . Abdominal hysterectomy 03/2005    Dr. Earlene Plater    History   Social History  . Marital Status: Married    Spouse Name: N/A    Number of Children: 1  . Years of Education: N/A   Occupational History  . works at [employer], and [employer]   .     Social History Main Topics  . Smoking status: Former Smoker -- 0.5 packs/day for 20 years    Types: Cigarettes    Quit date: 10/20/2004  . Smokeless tobacco: Never Used  . Alcohol Use: Yes     occasional  .  Drug Use: No  . Sexually Active: Not Currently   Other Topics Concern  . Not on file   Social History Narrative   Lives with husband, and 1 dog    Family History  Problem Relation Age of Onset  . Cancer Mother     bladder  . Cancer Father 50    colon cancer  . Diabetes Maternal Grandfather    Current Outpatient Prescriptions on File Prior to Visit  Medication Sig Dispense Refill  . albuterol (PROAIR HFA) 108 (90 BASE) MCG/ACT inhaler Inhale 2 puffs into the lungs every 3 (three) hours as needed.      Marland Kitchen aspirin-acetaminophen-caffeine (EXCEDRIN MIGRAINE) 250-250-65 MG per tablet  Per bottle as needed for headache      . budesonide-formoterol (SYMBICORT) 160-4.5 MCG/ACT inhaler Inhale 2 puffs into the lungs 2 (two) times daily.        . Calcium Carbonate-Vitamin D (CALCIUM 600+D) 600-400 MG-UNIT per tablet Take 1 tablet by mouth 2 (two) times daily.      . cetirizine (ZYRTEC) 10 MG tablet Take 10 mg by mouth daily as needed.        . Cholecalciferol (VITAMIN D3) 1000 UNITS CAPS Take 1 capsule by mouth daily.        . cyclobenzaprine (FLEXERIL) 10 MG tablet Take 1 tablet (10 mg total) by mouth 2 (two) times daily as needed for muscle spasms.  30 tablet  2  . cycloSPORINE (RESTASIS) 0.05 % ophthalmic emulsion Place 1 drop into both eyes 2 (two) times daily.        . fluticasone (FLONASE) 50 MCG/ACT nasal spray 2 sprays by Nasal route 2 (two) times daily as needed.      Marland Kitchen levothyroxine (SYNTHROID, LEVOTHROID) 75 MCG tablet TAKE 1 TABLET EVERY DAY  30 tablet  0  . meloxicam (MOBIC) 15 MG tablet Take 15 mg by mouth daily.        . metFORMIN (GLUMETZA) 500 MG (MOD) 24 hr tablet Take 1 tablet (500 mg total) by mouth daily with supper.  90 tablet  1  . Multiple Vitamins-Minerals (CENTRUM SILVER) tablet Take by mouth daily.      Marland Kitchen olmesartan-hydrochlorothiazide (BENICAR HCT) 20-12.5 MG per tablet Take 1 tablet by mouth daily. #28 samples given      . pantoprazole (PROTONIX) 40 MG tablet Take 40 mg by mouth daily. 30 minutes before first meal       . Polyethyl Glycol-Propyl Glycol (SYSTANE) 0.4-0.3 % SOLN Apply 1 drop to eye daily as needed.        . pravastatin (PRAVACHOL) 40 MG tablet Take 1 tablet (40 mg total) by mouth daily.  30 tablet  2  . pregabalin (LYRICA) 75 MG capsule Take 75 mg by mouth 2 (two) times daily.        Marland Kitchen rOPINIRole (REQUIP) 0.5 MG tablet Take 1/2-2 tablets at bedtime       . zolpidem (AMBIEN) 10 MG tablet Take 1 tablet (10 mg total) by mouth at bedtime as needed.  30 tablet  1    Allergies  Allergen Reactions  . Bupropion Hcl Hives   ROS:  The patient  denies fever, headaches,  vision changes, decreased hearing, ear pain, sore throat, breast concerns, chest pain, palpitations, dizziness, syncope, dyspnea on exertion, cough, swelling, nausea, vomiting, diarrhea, abdominal pain, melena, hematochezia, hematuria, incontinence, vaginal bleeding, discharge, odor or itch, genital lesions.  Denies tremor, suspicious skin lesions, depression, anxiety, abnormal bleeding/bruising, or enlarged lymph nodes.  Occasional pain  with urination, comes and goes. H/o UTI last year.  Pain is short-lived, only comes for a few seconds Tingling in fingers of R hand. Denies weakness. +heartburn. + constipation.  Intermittent insomnia, relieved by occasional ambien.  +hot flashes  PHYSICAL EXAM: BP 130/84  Pulse 72  Ht 5\' 7"  (1.702 m)  Wt 250 lb (113.399 kg)  BMI 39.16 kg/m2  General Appearance:    Alert, cooperative, no distress, appears stated age, obese  Head:    Normocephalic, without obvious abnormality, atraumatic  Eyes:    PERRL, conjunctiva/corneas clear, EOM's intact, fundi    benign  Ears:    Normal TM's and external ear canals  Nose:   Nares normal, mucosa normal, no drainage or sinus   tenderness  Throat:   Lips, mucosa, and tongue normal; teeth and gums normal  Neck:   Supple, no lymphadenopathy;  thyroid:  no   enlargement/tenderness/nodules; no carotid   bruit or JVD  Back:     no CVA tenderness; Tender over lower lumbar spine and extending to paraspinous muscles and SI joint bilaterally  Lungs:     Clear to auscultation bilaterally without wheezes, rales or     ronchi; respirations unlabored  Chest Wall:    No tenderness or deformity   Heart:    Regular rate and rhythm, S1 and S2 normal, no murmur, rub   or gallop  Breast Exam:    No tenderness, masses, or nipple discharge or inversion.      No axillary lymphadenopathy.  There is some horizontal linear areas of hyperpigmentation at mid-chest, extending to breasts bilaterally.  Pt has been scratching  these areas.  No true rash, slightly dry  Abdomen:     Soft, non-tender, nondistended, obese, normoactive bowel sounds, no masses, no hepatosplenomegaly. Mild tenderness at LLQ, extending inferiorly to L groin  Genitalia:   Normal external genitalia without lesions.  Bimanual exam--uterus absent.  No adnexal masses or tenderness, but exam limited due to body habitus.  Rectal:    Normal tone, no masses or tenderness; guaiac negative stool  Extremities:   No clubbing, cyanosis or edema.  See below for hip exam.  Pulses:   2+ and symmetric all extremities  Skin:   Skin color, texture, turgor normal, no rashes or lesions  Lymph nodes:   Cervical, supraclavicular, and axillary nodes normal  Neurologic:   CNII-XII intact, normal strength, sensation and gait; reflexes 2+ and symmetric throughout          Psych:   Normal mood, affect, hygiene and grooming.    Tender at L trochanteric bursa, but really tender quite diffusely, extending across L groin and even LLQ. Pain with all movements, and with flexion against resistance.  Strength intact  ASSESSMENT/PLAN: 1. Routine general medical examination at a health care facility  POCT Urinalysis Dipstick, Visual acuity screening  2. Pure hypercholesterolemia  Lipid panel, Comprehensive metabolic panel  3. Obesity, unspecified    4. Impaired fasting glucose    5. HYPOTHYROIDISM  TSH  6. HYPERTENSION  Comprehensive metabolic panel  7. GERD    8. Heme + stool  CBC with Differential  9. Encounter for long-term (current) use of other medications  Lipid panel, Comprehensive metabolic panel, CBC with Differential  10. Dysuria  Urine culture   GERD:  Risks and benefits of longterm PPI use reviewed.  Encouraged weight loss and proper diet. HTN--controlled.  Low sodium diet, exercise and weight loss encouraged IFG--exercise and weight loss encouraged.   Hypothyroid--TSH due  Hip pain--declines x-ray today.  May be radicular pain from back, but can't exclude hip  pathology or bursitis.  F/u with back doctor if x-rays normal (she will call if/when she wants to do them).  To discuss with her back doctor possible PT (or I can refer if related to hip)  Discussed monthly self breast exams and yearly mammograms; at least 30 minutes of aerobic activity at least 5 days/week; proper sunscreen use reviewed; healthy diet, including goals of calcium and vitamin D intake and alcohol recommendations (less than or equal to 1 drink/day) reviewed; regular seatbelt use; changing batteries in smoke detectors.  Immunization recommendations discussed.  Colonoscopy recommendations reviewed.  Past due for colonoscopy with family history of colon cancer.  Encouraged to schedule when she can. Hemoccult kit given. Heme + stool today

## 2011-12-13 LAB — COMPREHENSIVE METABOLIC PANEL
AST: 24 U/L (ref 0–37)
Alkaline Phosphatase: 74 U/L (ref 39–117)
BUN: 17 mg/dL (ref 6–23)
Glucose, Bld: 94 mg/dL (ref 70–99)
Total Bilirubin: 0.4 mg/dL (ref 0.3–1.2)

## 2011-12-13 LAB — LIPID PANEL
Cholesterol: 133 mg/dL (ref 0–200)
HDL: 42 mg/dL (ref >39)
Total CHOL/HDL Ratio: 3.2 Ratio
VLDL: 23 mg/dL (ref 0–40)

## 2011-12-13 LAB — TSH: TSH: 1.601 u[IU]/mL (ref 0.350–4.500)

## 2011-12-14 ENCOUNTER — Encounter: Payer: Self-pay | Admitting: Family Medicine

## 2011-12-14 LAB — URINE CULTURE: Colony Count: 85000

## 2011-12-20 ENCOUNTER — Other Ambulatory Visit: Payer: Self-pay | Admitting: Family Medicine

## 2011-12-22 ENCOUNTER — Other Ambulatory Visit: Payer: Self-pay | Admitting: Internal Medicine

## 2011-12-22 DIAGNOSIS — G4700 Insomnia, unspecified: Secondary | ICD-10-CM

## 2011-12-22 MED ORDER — ZOLPIDEM TARTRATE 10 MG PO TABS: 10.0000 mg | ORAL_TABLET | Freq: Every evening | ORAL | Status: DC | PRN

## 2011-12-22 NOTE — Telephone Encounter (Signed)
This one is for you 

## 2011-12-22 NOTE — Telephone Encounter (Signed)
Please phone in

## 2012-01-13 ENCOUNTER — Other Ambulatory Visit: Payer: Self-pay

## 2012-01-13 MED ORDER — OLMESARTAN MEDOXOMIL-HCTZ 20-12.5 MG PO TABS: 1.0000 | ORAL_TABLET | Freq: Every day | ORAL | Status: DC

## 2012-01-17 ENCOUNTER — Other Ambulatory Visit: Payer: Self-pay | Admitting: Family Medicine

## 2012-01-23 ENCOUNTER — Other Ambulatory Visit: Payer: Self-pay | Admitting: Family Medicine

## 2012-01-23 NOTE — Telephone Encounter (Signed)
Needs a refill on protonix #30. Pt has been out for the last 2 days

## 2012-01-27 ENCOUNTER — Encounter: Payer: PRIVATE HEALTH INSURANCE | Admitting: Internal Medicine

## 2012-01-27 NOTE — Progress Notes (Signed)
 This encounter was created in error - please disregard.

## 2012-02-01 ENCOUNTER — Other Ambulatory Visit: Payer: Self-pay | Admitting: Family Medicine

## 2012-02-12 ENCOUNTER — Other Ambulatory Visit: Payer: Self-pay | Admitting: Family Medicine

## 2012-02-13 ENCOUNTER — Telehealth: Payer: Self-pay | Admitting: Internal Medicine

## 2012-02-13 MED ORDER — OLMESARTAN MEDOXOMIL-HCTZ 20-12.5 MG PO TABS: 1.0000 | ORAL_TABLET | Freq: Every day | ORAL | Status: DC

## 2012-02-13 NOTE — Telephone Encounter (Signed)
tammy supplied each #28 tablets for benicar 20-12.5mg 

## 2012-03-08 ENCOUNTER — Telehealth: Payer: Self-pay | Admitting: Internal Medicine

## 2012-03-08 ENCOUNTER — Ambulatory Visit (INDEPENDENT_AMBULATORY_CARE_PROVIDER_SITE_OTHER): Payer: PRIVATE HEALTH INSURANCE | Admitting: Medical

## 2012-03-08 ENCOUNTER — Encounter: Payer: Self-pay | Admitting: Medical

## 2012-03-08 VITALS — BP 120/80 | HR 76 | Temp 98.2°F | Resp 16 | Wt 256.0 lb

## 2012-03-08 DIAGNOSIS — E669 Obesity, unspecified: Secondary | ICD-10-CM

## 2012-03-08 DIAGNOSIS — M25561 Pain in right knee: Secondary | ICD-10-CM

## 2012-03-08 DIAGNOSIS — M25569 Pain in unspecified knee: Secondary | ICD-10-CM

## 2012-03-08 MED ORDER — TRAMADOL HCL 50 MG PO TABS: 50.0000 mg | ORAL_TABLET | Freq: Three times a day (TID) | ORAL | Status: AC | PRN

## 2012-03-08 MED ORDER — OLMESARTAN MEDOXOMIL-HCTZ 20-12.5 MG PO TABS: 1.0000 | ORAL_TABLET | Freq: Every day | ORAL | Status: DC

## 2012-03-08 NOTE — Patient Instructions (Signed)
Continue using ice daily, stay off the knee when possible for the next 2 weeks, continue Mobic which you are already taking for pain and inflammation.  Try using a neoprene Knee sleeve OTC for support over the next 1-2 weeks.    If this still isn't helping, let me know and we can consider physical therapy or orthopedic referral.

## 2012-03-08 NOTE — Telephone Encounter (Signed)
Called, spoke with pt who states it has been a while since she was last seen - Last OV with [facility] OCt 2012.  Pt's insurance will expire at the end of this month -- she would like OV prior to this.  Denies any problems with breathing at this time.  Dr. Sherene Sires does not have any openings prior to end of May.  Pt fine with seeing Tammy P, NP for follow up.  OV scheduled with TP on Friday, May 24 at 3:45 pm -- pt aware.  Nothing further needed at this time.

## 2012-03-08 NOTE — Progress Notes (Signed)
Subjective:   HPI  Lisa [name] is a 51 y.o. female who presents with 2 mo hx/o right knee pain.  Denies trauma, injury, fall.  Initially thought it was from working all day on her feet at work. She gets pain on outside of right knee, into the back of the knee.  Gets swelling in the right knee after a few hours of being on her legs at work.  Denies issues with going up or down stairs.  Denies pain with twisting or turning.  She notes chronic back pain that radiates down her legs, but this is hard to separate from her knee pain behind the knees.  Has been using some ice for the knee. Changed shoes and this helped feet, but not the knee.  Does get some cramping in feet.  She using Meloxicam daily for back pain, but can't take Tylenol due to Sarcoidosis.  Works standing on the job making [employer].  No other aggravating or relieving factors.    No other c/o.  The following portions of the patient's history were reviewed and updated as appropriate: allergies, current medications, past family history, past medical history, past social history, past surgical history and problem list.  Past Medical History  Diagnosis Date  . Essential hypertension, benign   . Obesity, unspecified   . Sarcoidosis     pulmonary and hepatic; followed by Dr. Sherene Sires  . Esophageal reflux   . Unspecified hypothyroidism   . Anemia   . Unspecified vitamin D deficiency     resolved  . Low back pain     Dr. Noel Gerold  . Restless leg syndrome   . Impaired fasting glucose     02/2010--fglu 126, A1c 6.2  . Hyperlipidemia     Allergies  Allergen Reactions  . Bupropion Hcl Hives     Review of Systems ROS reviewed and was negative other than noted in HPI or above.    Objective:   Physical Exam  General appearance: alert, no distress, WD/WN, obesity MSK: tender right patellar tendon, tender over inferior patella, mild pain with over patella with quadriceps extension, otherwise no swelling, no joint line  tenderness, no obvious laxity, no meniscus sign, rest of LE unremarkable Pulses: 2+ symmetric, upper and lower extremities, normal cap refill   Assessment and Plan :     Encounter Diagnoses  Name Primary?  . Knee pain, right Yes  . 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  Advised she c/t Ice, Mobic, elevation, relative rest, but begin knee sleeve OTC.  Recheck 10-14 days.  Consider PT if not improving.   Obesity - discussed need for weight loss.  Consider pool aerobics at the [facility], or lap swimming as means to help with exercise and weight loss that would be low impact.

## 2012-03-12 ENCOUNTER — Encounter: Payer: Self-pay | Admitting: Adult Health

## 2012-03-12 ENCOUNTER — Other Ambulatory Visit: Payer: Self-pay | Admitting: Family Medicine

## 2012-03-12 ENCOUNTER — Ambulatory Visit (INDEPENDENT_AMBULATORY_CARE_PROVIDER_SITE_OTHER): Payer: PRIVATE HEALTH INSURANCE | Admitting: Adult Health

## 2012-03-12 VITALS — BP 112/56 | HR 63 | Temp 97.0°F | Ht 67.0 in | Wt 257.6 lb

## 2012-03-12 DIAGNOSIS — Z23 Encounter for immunization: Secondary | ICD-10-CM

## 2012-03-12 DIAGNOSIS — D869 Sarcoidosis, unspecified: Secondary | ICD-10-CM

## 2012-03-12 DIAGNOSIS — G4700 Insomnia, unspecified: Secondary | ICD-10-CM

## 2012-03-12 MED ORDER — ZOLPIDEM TARTRATE 10 MG PO TABS: 10.0000 mg | ORAL_TABLET | Freq: Every evening | ORAL | Status: DC | PRN

## 2012-03-12 NOTE — Patient Instructions (Signed)
Continue on current regimen.  follow up Dr. Sherene Sires  In 4-6 months  Pneumovax today

## 2012-03-12 NOTE — Progress Notes (Signed)
Subjective:    Patient ID: Lisa [name], female    DOB: 21-Jul-1961, 51 y.o.   MRN: [id number]  HPI 45 yobf quit smoking 03/2005 with chronic sarcoidosis manifested mostly arthritic complaints (started maybe in retrospect in 2004) prednisone -dependent since January 2008. Each time she lowers the dose in the past she has noticed a flare of her arthritis an does not respond to maximum doses of ibuprofen.    March 27, 2010 6 wk followup. Pt c/o cough x 3 wks. She states that it only happens at night when lies down. She states that cough is dry. She states that she has been postponing her pm dose of the symbicort 80 for when the cough occurs and this seems to help. no sob. arthritis ok on 5 mg every other day dosing. no ocular c/o or rash. rec try taper to q 3 d dosing and increase symbicort to 160 2bid   May 08, 2010 ov 6 wk followup. Pt states that her cough is better. Still has some cough in the am- prod with thick yellow sputum. She states had some itchy rash on her left leg a wk ago but this has resolved. actually on 2.5 mg q3d with no complaints on the days she misses pred dosing. >>Prednisone stopped.   July 04, 2010--Presents for est med calendar - pt brought all meds with her today. no new complaints. Last visit weaned off steroids. No flare since last vist. We reviewed her meds and updated her med calendar. . Got her flu shot yesterday. no change rx   October 04, 2010 ov no chang cc doe but hacky dry cough worse, feels like throat is irritated ? from symbicort esp in am. Change the way you use symbicort and only 12 hours if breathing difficulty or wheezing  Add pepcid 20 mg one at bedtime  Try chlortrimeton 4 mg one at bedtime   December 04, 2010 ov all better, rare need for symbicort, all smiles, no cough or sob. >>no changes   02/14/2011 follow up and med review Pt returns for follow up and med review. She is doing well since last visit . No flare in cough or dyspena.  Rare use of  ProAir.  Uses Symbicort As needed  . Rare use. No significant drainage or tickle in throat. Has  Stopped clortrimeton. Has zyrtec but not using it.   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 rec No change rx, follow the calendar provided    07/25/2011 f/u ov/[facility] cc  Pt states no resp c/o's. She has had itchy  rash underneath arm x 6 wks, improving now. Did not bring med calendar - arthritis controlled with otcs.  No ocular co's no cough no sob rec Follow calendar  01/27/2012 f/u ov/[facility] cc  No show   03/12/2012 Follow up  Doing well with no increased cough or dyspnea.  Does have arthritic symptoms in hips and back/legs, controlled on lyrica .  Insurance running out at end of May and is trying to get new insurance.  Last xray was 07/2011 with no acute changes.    ROS:  Constitutional:   No  weight loss, night sweats,  Fevers, chills,  +fatigue, or  lassitude.  HEENT:   No headaches,  Difficulty swallowing,  Tooth/dental problems, or  Sore throat,                No sneezing, itching, ear ache, nasal congestion, post nasal drip,   CV:  No chest pain,  Orthopnea, PND, swelling in lower extremities, anasarca, dizziness, palpitations, syncope.   GI  No heartburn, indigestion, abdominal pain, nausea, vomiting, diarrhea, change in bowel habits, loss of appetite, bloody stools.   Resp: No shortness of breath with exertion or at rest.  No excess mucus, no productive cough,  No non-productive cough,  No coughing up of blood.  No change in color of mucus.  No wheezing.  No chest wall deformity  Skin: no rash or lesions.  GU: no dysuria, change in color of urine, no urgency or frequency.  No flank pain, no hematuria   MS:  No joint   swelling.      Psych:  No change in mood or affect. No depression or anxiety.  No memory loss.                    Objective:   Physical Exam  Wt 260 > 253 07/25/2011 > 01/27/2012  GEN: A/Ox3; pleasant , NAD, well nourished ,  obese  HEENT:  [place]/AT,  EACs-clear, TMs-wnl, NOSE-clear, THROAT-clear, no lesions, no postnasal drip or exudate noted.   NECK:  Supple w/ fair ROM; no JVD; normal carotid impulses w/o bruits; no thyromegaly or nodules palpated; no lymphadenopathy.  RESP  Coarse BS w/ no wheezing.    CARD:  RRR, no m/r/g  , no peripheral edema, pulses intact, no cyanosis or clubbing.  GI:   Soft & nt; nml bowel sounds; no organomegaly or masses detected.  Musco: Warm bil, no deformities or joint swelling noted.   Neuro: alert, no focal deficits noted.    Skin: Warm, no lesions or rashes       CXR  07/25/2011 :  Comparison: 09/06/2010  Findings: Heart size is normal.  No pleural effusion or pulmonary edema.  Left upper lobe scarring and architectural distortion appears similar to the previous examination.  No new pulmonary parenchymal abnormalities identified.  The hilar and mediastinal contours are within normal limits.  IMPRESSION:  1. Stable exam.    Assessment & Plan:

## 2012-03-12 NOTE — Assessment & Plan Note (Signed)
Compensated on present regimen.  Pneumovax today  

## 2012-03-12 NOTE — Progress Notes (Signed)
Addended by: Boone Master E on: 03/12/2012 05:19 PM   Modules accepted: Orders

## 2012-03-12 NOTE — Telephone Encounter (Signed)
Please call in for patient as pended. (last filled March)

## 2012-03-12 NOTE — Telephone Encounter (Signed)
I sent this to you--see other documentation.  Yes it is okay

## 2012-03-12 NOTE — Telephone Encounter (Signed)
Called in [place] 10mg  #30 1 refill to pharmacy

## 2012-03-12 NOTE — Telephone Encounter (Signed)
Is this okay?

## 2012-04-12 ENCOUNTER — Other Ambulatory Visit: Payer: Self-pay | Admitting: Family Medicine

## 2012-04-12 ENCOUNTER — Telehealth: Payer: Self-pay | Admitting: Medical

## 2012-04-12 MED ORDER — OLMESARTAN MEDOXOMIL-HCTZ 20-12.5 MG PO TABS: 1.0000 | ORAL_TABLET | Freq: Every day | ORAL | Status: DC

## 2012-04-12 MED ORDER — BUDESONIDE-FORMOTEROL FUMARATE 160-4.5 MCG/ACT IN AERO: 2.0000 | INHALATION_SPRAY | Freq: Two times a day (BID) | RESPIRATORY_TRACT | Status: DC

## 2012-04-12 MED ORDER — LEVOTHYROXINE SODIUM 75 MCG PO TABS: 75.0000 ug | ORAL_TABLET | Freq: Every day | ORAL | Status: DC

## 2012-04-12 NOTE — Telephone Encounter (Signed)
pls give benicar HCT 12.5/25 samples, once daily if we have them

## 2012-04-12 NOTE — Telephone Encounter (Signed)
Patient is aware of the samples per Jacques Earthly CMA. CLS

## 2012-04-12 NOTE — Telephone Encounter (Signed)
Pt wants Benicar samples  20/12.5  Please call patient

## 2012-05-07 ENCOUNTER — Other Ambulatory Visit: Payer: Self-pay | Admitting: Family Medicine

## 2012-05-07 DIAGNOSIS — Z1231 Encounter for screening mammogram for malignant neoplasm of breast: Secondary | ICD-10-CM

## 2012-05-19 ENCOUNTER — Encounter (HOSPITAL_COMMUNITY): Payer: Self-pay | Admitting: Emergency Medicine

## 2012-05-19 ENCOUNTER — Emergency Department (HOSPITAL_COMMUNITY): Payer: Self-pay

## 2012-05-19 ENCOUNTER — Emergency Department (HOSPITAL_COMMUNITY)
Admission: EM | Admit: 2012-05-19 | Discharge: 2012-05-19 | Disposition: A | Discharge status: Home / Self Care | Payer: Self-pay | Attending: Emergency Medicine | Admitting: Emergency Medicine

## 2012-05-19 DIAGNOSIS — Z87891 Personal history of nicotine dependence: Secondary | ICD-10-CM | POA: Insufficient documentation

## 2012-05-19 DIAGNOSIS — E039 Hypothyroidism, unspecified: Secondary | ICD-10-CM | POA: Insufficient documentation

## 2012-05-19 DIAGNOSIS — J40 Bronchitis, not specified as acute or chronic: Secondary | ICD-10-CM | POA: Insufficient documentation

## 2012-05-19 DIAGNOSIS — I10 Essential (primary) hypertension: Secondary | ICD-10-CM | POA: Insufficient documentation

## 2012-05-19 DIAGNOSIS — E785 Hyperlipidemia, unspecified: Secondary | ICD-10-CM | POA: Insufficient documentation

## 2012-05-19 DIAGNOSIS — D869 Sarcoidosis, unspecified: Secondary | ICD-10-CM | POA: Insufficient documentation

## 2012-05-19 MED ORDER — PREDNISONE 10 MG PO TABS: ORAL_TABLET | ORAL | Status: DC

## 2012-05-19 MED ORDER — PREDNISONE 20 MG PO TABS
60.0000 mg | ORAL_TABLET | Freq: Once | ORAL | Status: AC
  Administered 2012-05-19: 60 mg via ORAL
  Filled 2012-05-19: qty 3

## 2012-05-19 MED ORDER — HYDROCODONE-HOMATROPINE 5-1.5 MG/5ML PO SYRP: 5.0000 mL | ORAL_SOLUTION | Freq: Four times a day (QID) | ORAL | Status: AC | PRN

## 2012-05-19 NOTE — ED Notes (Signed)
Patient returned from X-ray 

## 2012-05-19 NOTE — ED Provider Notes (Signed)
History     CSN: [id number]  Arrival date & time 05/19/12  1815   First MD Initiated Contact with Patient 05/19/12 1956      Chief Complaint  Patient presents with  . Cough    (Consider location/radiation/quality/duration/timing/severity/associated sxs/prior treatment) HPI Comments: Lisa [name] is a 51 y.o. Female who presents with complaint of a cough for about a week. States cough is dry, non productive. States sore throat from coughing, nasal congestion, pain and heaviness in her eyes. States taking mucinex and robitussin with no relief. Pt states she is also taking her sympbicort and has taken albuterol 3 times in the last week with no improvement. Pt denies fever, chills, shortness of breath, malaise, chest pain. States cough is worse at night.    Past Medical History  Diagnosis Date  . Essential hypertension, benign   . Obesity, unspecified   . Sarcoidosis     pulmonary and hepatic; followed by Dr. Sherene Sires  . Esophageal reflux   . Unspecified hypothyroidism   . Anemia   . Unspecified vitamin D deficiency     resolved  . Low back pain     Dr. Noel Gerold  . Restless leg syndrome   . Impaired fasting glucose     02/2010--fglu 126, A1c 6.2  . Hyperlipidemia     Past Surgical History  Procedure Date  . Abdominal hysterectomy 03/2005    Dr. Earlene Plater    Family History  Problem Relation Age of Onset  . Cancer Mother     bladder  . Cancer Father 42    colon cancer  . Diabetes Maternal Grandfather     History  Substance Use Topics  . Smoking status: Former Smoker -- 0.5 packs/day for 20 years    Types: Cigarettes    Quit date: 10/20/2004  . Smokeless tobacco: Never Used  . Alcohol Use: Yes     occasional    OB History    Grav Para Term Preterm Abortions TAB SAB Ect Mult Living                  Review of Systems  Constitutional: Negative for fever and chills.  HENT: Positive for congestion, sore throat and sinus pressure. Negative for neck pain and neck  stiffness.   Respiratory: Positive for cough. Negative for chest tightness, shortness of breath and wheezing.   Cardiovascular: Negative for chest pain, palpitations and leg swelling.  Musculoskeletal: Negative.   Neurological: Negative for dizziness, weakness, light-headedness and headaches.    Allergies  Bupropion hcl  Home Medications   Current Outpatient Rx  Name Route Sig Dispense Refill  . ALBUTEROL SULFATE HFA 108 (90 BASE) MCG/ACT IN AERS Inhalation Inhale 2 puffs into the lungs every 3 (three) hours as needed.    Marland Kitchen VITAMIN C 1000 MG PO TABS Oral Take 1,000 mg by mouth daily.    . ASPIRIN-ACETAMINOPHEN-CAFFEINE 250-250-65 MG PO TABS  Per bottle as needed for headache    . BUDESONIDE-FORMOTEROL FUMARATE 160-4.5 MCG/ACT IN AERO Inhalation Inhale 2 puffs into the lungs 2 (two) times daily. 1 Inhaler 0  . CALCIUM CARBONATE ANTACID 500 MG PO CAPS Oral Take 1 capsule by mouth daily as needed. Cold and cough    . CALCIUM CARBONATE-VITAMIN D 600-400 MG-UNIT PO TABS Oral Take 1 tablet by mouth 2 (two) times daily.    Marland Kitchen VITAMIN D3 1000 UNITS PO CAPS Oral Take 1 capsule by mouth daily.      . CYCLOBENZAPRINE HCL 10 MG PO  TABS Oral Take 1 tablet (10 mg total) by mouth 2 (two) times daily as needed for muscle spasms. 30 tablet 2  . CYCLOSPORINE 0.05 % OP EMUL Both Eyes Place 1 drop into both eyes 2 (two) times daily.      Marland Kitchen FLUTICASONE PROPIONATE 50 MCG/ACT NA SUSP Nasal 2 sprays by Nasal route 2 (two) times daily as needed.    Marland Kitchen LEVOTHYROXINE SODIUM 75 MCG PO TABS Oral Take 1 tablet (75 mcg total) by mouth daily. 28 tablet 0  . MAGNESIUM GLUCONATE 500 MG PO TABS Oral Take 500 mg by mouth daily.    . MELOXICAM 15 MG PO TABS Oral Take 15 mg by mouth daily.      Marland Kitchen METFORMIN HCL ER (MOD) 500 MG PO TB24 Oral Take 1 tablet (500 mg total) by mouth daily with supper. 90 tablet 1  . CENTRUM SILVER PO TABS Oral Take by mouth daily.    Marland Kitchen OLMESARTAN MEDOXOMIL-HCTZ 20-12.5 MG PO TABS Oral Take 1 tablet  by mouth daily. #28 samples given 28 tablet 0  . PANTOPRAZOLE SODIUM 40 MG PO TBEC  TAKE 1 TABLET BY MOUTH ONCE A DAY AS NEEDED 30 tablet 11  . POLYETHYL GLYCOL-PROPYL GLYCOL 0.4-0.3 % OP SOLN Ophthalmic Apply 1 drop to eye daily as needed. Dry eyes    . PRAVASTATIN SODIUM 40 MG PO TABS  TAKE 1 TABLET (40 MG TOTAL) BY MOUTH DAILY. 30 tablet 1  . PREGABALIN 75 MG PO CAPS Oral Take 75 mg by mouth 2 (two) times daily.      Marland Kitchen ROPINIROLE HCL 0.5 MG PO TABS  Take 1/2-2 tablets at bedtime     . VITAMIN B-12 1000 MCG PO TABS Oral Take 1,000 mcg by mouth daily.    Marland Kitchen ZOLPIDEM TARTRATE 10 MG PO TABS Oral Take 1 tablet (10 mg total) by mouth at bedtime as needed. 30 tablet 1    BP 103/67  Pulse 74  Resp 16  SpO2 97%  Physical Exam  Nursing note and vitals reviewed. Constitutional: She is oriented to person, place, and time.  HENT:  Head: Normocephalic and atraumatic.  Right Ear: External ear normal.  Left Ear: External ear normal.  Nose: Nose normal.  Mouth/Throat: Oropharynx is clear and moist.       TMs normal bilaterally  Eyes: Conjunctivae are normal.  Neck: Normal range of motion. Neck supple.  Cardiovascular: Normal rate, regular rhythm and normal heart sounds.   Pulmonary/Chest: Effort normal and breath sounds normal. No respiratory distress. She has no wheezes. She has no rales.  Musculoskeletal: Normal range of motion. She exhibits no edema.  Neurological: She is alert and oriented to person, place, and time.  Skin: Skin is warm and dry.  Psychiatric: She has a normal mood and affect.    ED Course  Procedures (including critical care time)  Labs Reviewed - No data to display Dg Chest 2 View  05/19/2012  *RADIOLOGY REPORT*  Clinical Data: Dry cough and mid chest pain for the past week. History of sarcoidosis.  CHEST - 2 VIEW  Comparison: Chest x-ray 07/25/2011.  Findings: Prominent interstitial markings are noted in the left perihilar and bilateral suprahilar regions, similar to  prior examinations, possibly areas of scarring and architectural distortion related to reported history of sarcoidosis.  No definite acute consolidative air space disease.  No pleural effusions. Pulmonary vasculature and the cardiomediastinal silhouette are within normal limits.  IMPRESSION: 1.  No radiographic evidence of acute cardiopulmonary disease. 2.  Appearance of the lungs is similar to prior examinations, again suggestive of scarring related to sarcoidosis.  Original Report Authenticated By: Florencia Reasons, M.D.   Lungs are clear on exam. Pt has dry non productive cough. She is afebrile, URI symptoms as well. Suspect bronchitis. WIll treat with steroids, inhaler, cough medication. Do not think antibiotics are appropriate at this time.   1. Bronchitis       MDM          Lottie Mussel, PA 05/20/12 0144

## 2012-05-19 NOTE — ED Notes (Signed)
Pt states pain to mid sternal chest is only when she coughs. States her throat is slightly sore from coughing.

## 2012-05-19 NOTE — ED Notes (Signed)
Pt c/o dry cough for one week. Pt also c/o mid sternal chest pain since Friday. Pt thinks is due to coughing. Pt states she has a history of sarcoidosis.

## 2012-05-21 ENCOUNTER — Telehealth: Payer: Self-pay | Admitting: Family Medicine

## 2012-05-21 MED ORDER — PANTOPRAZOLE SODIUM 40 MG PO TBEC: 40.0000 mg | DELAYED_RELEASE_TABLET | Freq: Every day | ORAL | Status: DC

## 2012-05-21 NOTE — Telephone Encounter (Signed)
THIS IS FOR A NEW PHARMACY......... PT NEEDS REFILL ON PANTOPRAZOLE SOD DR 40 MG SENT TO ....Marland KitchenSENT TO NEW PHARMACY....[facility] [address].

## 2012-05-21 NOTE — ED Provider Notes (Signed)
Medical screening examination/treatment/procedure(s) were performed by non-physician practitioner and as supervising physician I was immediately available for consultation/collaboration.  Edwardine Deschepper, MD 05/21/12 1318 

## 2012-05-24 ENCOUNTER — Telehealth: Payer: Self-pay | Admitting: Internal Medicine

## 2012-05-24 NOTE — Telephone Encounter (Signed)
Contacted patient, informed patient that we do not have any rescue inhalers at this time but that I have  A Symbicort sample I will leave upfront for her to pick up. States will pick up tomorrow. Nothing further needed.

## 2012-05-25 ENCOUNTER — Ambulatory Visit (INDEPENDENT_AMBULATORY_CARE_PROVIDER_SITE_OTHER): Payer: Self-pay | Admitting: Medical

## 2012-05-25 ENCOUNTER — Encounter: Payer: Self-pay | Admitting: Medical

## 2012-05-25 VITALS — BP 92/60 | HR 68 | Temp 98.1°F | Resp 18 | Wt 251.0 lb

## 2012-05-25 DIAGNOSIS — R059 Cough, unspecified: Secondary | ICD-10-CM

## 2012-05-25 DIAGNOSIS — R05 Cough: Secondary | ICD-10-CM

## 2012-05-25 DIAGNOSIS — J40 Bronchitis, not specified as acute or chronic: Secondary | ICD-10-CM

## 2012-05-25 MED ORDER — HYDROCODONE-HOMATROPINE 5-1.5 MG/5ML PO SYRP: 5.0000 mL | ORAL_SOLUTION | Freq: Three times a day (TID) | ORAL | Status: AC | PRN

## 2012-05-25 MED ORDER — AZITHROMYCIN 250 MG PO TABS: ORAL_TABLET | ORAL | Status: AC

## 2012-05-25 NOTE — Progress Notes (Signed)
Subjective: Here for hospital f/u.  Went to [facility] ED on 05/19/12 for cough, and was treated for bronchitis with prednisone Dosepak, Hycodan syrup, and advised to c/t her albuterol.  She is here as she is not better.  She reports cough going on 3 wk now.  Cough, chest congestion, cough is all day long, sore throat, hoarseness, but no wheezing, SOB, chest pain, sinus pressure, ear pain or rash.  No fever.  She does have some sweats.  She finished the prednisone today, is almost out of the Hycodan, is using Symbicort 2 puffs BID, and is using the albuterol 2 puffs every 4 hours.   She is followed by Dr. Sherene Sires for Sarcoidosis and this has been stable.  Denies sick contacts.  No other aggravating or relieving factors.  No other c/o.  The following portions of the patient's history were reviewed and updated as appropriate: allergies, current medications, past family history, past medical history, past social history, past surgical history and problem list.   Objective:   Filed Vitals:   05/25/12 1152  BP: 92/60  Pulse: 68  Temp: 98.1 F (36.7 C)  Resp: 18    General appearance: Alert, WD/WN, no distress,somehwat ill appearing, coughing                             Skin: warm, no rash, no diaphoresis                           Head: no sinus tenderness                            Eyes: conjunctiva normal, corneas clear, PERRLA                            Ears: pearly TMs, external ear canals normal                          Nose: septum midline, turbinates swollen, with erythema and clear discharge             Mouth/throat: MMM, tongue normal, mild pharyngeal erythema                           Neck: supple, no adenopathy, no thyromegaly, nontender                          Heart: RRR, normal S1, S2, no murmurs                         Lungs: +bronchial breath sounds, +scattered rhonchi, no wheezes, no rales                Extremities: no edema, nontender     Assessment and Plan:   Encounter Diagnoses    Name Primary?  . Bronchitis Yes  . Cough     Reviewed ED notes and CXR from 05/19/12.  Prescription given today for Zpak, refilled Hycodan as below.  Discussed diagnosis and treatment of bronchitis. Advised OTC Mucinex DM, salt water gargles, warm fluids, voice rest.   Call/return in 2-3 days if symptoms are worse or not improving.

## 2012-05-25 NOTE — Patient Instructions (Signed)
Continue Mucinex DM twice daily.  Drink a big glass of water every hour.    Begin Zpak antibiotic.  I refilled the cough syrup.   Rest.  Use salt water gargles and warm fluids such as coffee and tea.  Let your voice rest.   If not improving in 3-5 days, then call Dr. Sherene Sires.

## 2012-05-27 ENCOUNTER — Inpatient Hospital Stay: Payer: PRIVATE HEALTH INSURANCE | Admitting: Family Medicine

## 2012-05-31 ENCOUNTER — Telehealth: Payer: Self-pay | Admitting: Internal Medicine

## 2012-05-31 NOTE — Telephone Encounter (Signed)
Is she taking her Symbicort 2 puffs BID?   What are her current symptoms? Did she get any better?

## 2012-06-01 ENCOUNTER — Other Ambulatory Visit: Payer: Self-pay | Admitting: Medical

## 2012-06-01 ENCOUNTER — Telehealth: Payer: Self-pay | Admitting: Family Medicine

## 2012-06-01 MED ORDER — PREDNISONE 20 MG PO TABS: ORAL_TABLET | ORAL | Status: DC

## 2012-06-01 MED ORDER — LEVOFLOXACIN 500 MG PO TABS: 500.0000 mg | ORAL_TABLET | Freq: Every day | ORAL | Status: AC

## 2012-06-01 NOTE — Telephone Encounter (Signed)
Patient was made aware that another RX was sent to the pharmacy for her. CLS

## 2012-06-01 NOTE — Telephone Encounter (Signed)
Pt called and states she is about the same with her sx.  A very hard cough, gag, mucous, sounds like she is wheezing.  She is using her Symbicort 2 puffs bid, she is using cough syrup dm.  Please advise pt by cell phone only [phone]

## 2012-06-01 NOTE — Telephone Encounter (Signed)
i sent another round of antibiotic Levaquin and 5 days of steroid.  If this doesn't help then she needs recheck OV or see her pulmonologist

## 2012-06-01 NOTE — Telephone Encounter (Signed)
SEE MSG

## 2012-06-01 NOTE — Telephone Encounter (Signed)
See msg

## 2012-06-01 NOTE — Telephone Encounter (Signed)
Pt not any better, still has a hard cough, coughing up phlegm, gag, wheeze.  Pt is using Symbicort 2 puffs bid, and using cough syrup dm.  Please advise pt what to do.  Please use her cell phone only  [phone]

## 2012-06-10 ENCOUNTER — Encounter: Payer: Self-pay | Admitting: Adult Health

## 2012-06-10 ENCOUNTER — Ambulatory Visit (INDEPENDENT_AMBULATORY_CARE_PROVIDER_SITE_OTHER): Payer: Self-pay | Admitting: Adult Health

## 2012-06-10 VITALS — BP 120/78 | HR 90 | Temp 99.0°F | Ht 67.0 in | Wt 241.0 lb

## 2012-06-10 DIAGNOSIS — D869 Sarcoidosis, unspecified: Secondary | ICD-10-CM

## 2012-06-10 MED ORDER — PREDNISONE 10 MG PO TABS: ORAL_TABLET | ORAL | Status: DC

## 2012-06-10 MED ORDER — DOXYCYCLINE HYCLATE 100 MG PO TABS: 100.0000 mg | ORAL_TABLET | Freq: Two times a day (BID) | ORAL | Status: AC

## 2012-06-10 NOTE — Progress Notes (Signed)
Subjective:    Patient ID: Lisa [name], female    DOB: 07/17/1961, 51 y.o.   MRN: [id number]  HPI 58 yobf quit smoking 03/2005 with chronic sarcoidosis manifested mostly arthritic complaints (started maybe in retrospect in 2004) prednisone -dependent since January 2008. Each time she lowers the dose in the past she has noticed a flare of her arthritis an does not respond to maximum doses of ibuprofen.    March 27, 2010 6 wk followup. Pt c/o cough x 3 wks. She states that it only happens at night when lies down. She states that cough is dry. She states that she has been postponing her pm dose of the symbicort 80 for when the cough occurs and this seems to help. no sob. arthritis ok on 5 mg every other day dosing. no ocular c/o or rash. rec try taper to q 3 d dosing and increase symbicort to 160 2bid   May 08, 2010 ov 6 wk followup. Pt states that her cough is better. Still has some cough in the am- prod with thick yellow sputum. She states had some itchy rash on her left leg a wk ago but this has resolved. actually on 2.5 mg q3d with no complaints on the days she misses pred dosing. >>Prednisone stopped.   July 04, 2010--Presents for est med calendar - pt brought all meds with her today. no new complaints. Last visit weaned off steroids. No flare since last vist. We reviewed her meds and updated her med calendar. . Got her flu shot yesterday. no change rx   October 04, 2010 ov no chang cc doe but hacky dry cough worse, feels like throat is irritated ? from symbicort esp in am. Change the way you use symbicort and only 12 hours if breathing difficulty or wheezing  Add pepcid 20 mg one at bedtime  Try chlortrimeton 4 mg one at bedtime   December 04, 2010 ov all better, rare need for symbicort, all smiles, no cough or sob. >>no changes   02/14/2011 follow up and med review Pt returns for follow up and med review. She is doing well since last visit . No flare in cough or dyspena.  Rare use of  ProAir.  Uses Symbicort As needed  . Rare use. No significant drainage or tickle in throat. Has  Stopped clortrimeton. Has zyrtec but not using it.   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 rec No change rx, follow the calendar provided    07/25/2011 f/u ov/Wert cc  Pt states no resp c/o's. She has had itchy  rash underneath arm x 6 wks, improving now. Did not bring med calendar - arthritis controlled with otcs.  No ocular co's no cough no sob rec Follow calendar  01/27/2012 f/u ov/Wert cc  No show   03/12/2012 Follow up  Doing well with no increased cough or dyspnea.  Does have arthritic symptoms in hips and back/legs, controlled on lyrica .  Insurance running out at end of May and is trying to get new insurance.  Last xray was 07/2011 with no acute changes.  >no changes   06/10/2012 Acute OV  Complains of persist cough for last 4 weeks. Was  Seen in ER -prednisone taper . She does not have insurance.  Complains of  hoarseness, prod cough with white and some yellow mucus x3weeks. No hemoptysis or edema.  No fever .    ROS:  Constitutional:   No  weight loss, night sweats,  Fevers, chills,  +fatigue, or  lassitude.  HEENT:   No headaches,  Difficulty swallowing,  Tooth/dental problems, or  Sore throat,                No sneezing, itching, ear ache,  +nasal congestion, post nasal drip,   CV:  No chest pain,  Orthopnea, PND, swelling in lower extremities, anasarca, dizziness, palpitations, syncope.   GI  No heartburn, indigestion, abdominal pain, nausea, vomiting, diarrhea, change in bowel habits, loss of appetite, bloody stools.   Resp: No coughing up of blood.    No chest wall deformity  Skin: no rash or lesions.  GU: no dysuria, change in color of urine, no urgency or frequency.  No flank pain, no hematuria   MS:  No joint   swelling.      Psych:  No change in mood or affect. No depression or anxiety.  No memory loss.                      Objective:   Physical Exam  Wt 260 > 253 07/25/2011 > 01/27/2012  GEN: A/Ox3; pleasant , NAD, well nourished , obese  HEENT:  [place]/AT,  EACs-clear, TMs-wnl, NOSE-clear, THROAT-clear, no lesions, no postnasal drip or exudate noted.   NECK:  Supple w/ fair ROM; no JVD; normal carotid impulses w/o bruits; no thyromegaly or nodules palpated; no lymphadenopathy.  RESP  Coarse BS w/ no wheezing.    CARD:  RRR, no m/r/g  , no peripheral edema, pulses intact, no cyanosis or clubbing.  GI:   Soft & nt; nml bowel sounds; no organomegaly or masses detected.  Musco: Warm bil, no deformities or joint swelling noted.   Neuro: alert, no focal deficits noted.    Skin: Warm, no lesions or rashes       CXR 05/19/12 (ER )  No radiographic evidence of acute cardiopulmonary disease.   Appearance of the lungs is similar to prior examinations, again  suggestive of scarring related to sarcoidosis.    Assessment & Plan:

## 2012-06-10 NOTE — Patient Instructions (Addendum)
Doxycycline 100mg  Twice daily  For 7 days  Prednisone 20mg  daily x 1 week then 10mg  daily x 1 week, then 5mg  daily for 1 week and stop Delsym 2 tsp Twice daily  As needed  Cough. Chlor tabs 4mg  1 every 4 hr s as needed for tickle in throat .  Add pepcid 20mg   At bedtime   Please contact office for sooner follow up if symptoms do not improve or worsen or seek emergency care  Follow up Dr. Sherene Sires  2-3 months

## 2012-06-14 NOTE — Assessment & Plan Note (Signed)
Flare with AB   Plan Doxycycline 100mg  Twice daily  For 7 days  Prednisone 20mg  daily x 1 week then 10mg  daily x 1 week, then 5mg  daily for 1 week and stop Delsym 2 tsp Twice daily  As needed  Cough. Chlor tabs 4mg  1 every 4 hr s as needed for tickle in throat .  Add pepcid 20mg   At bedtime   Please contact office for sooner follow up if symptoms do not improve or worsen or seek emergency care  Follow up Dr. Sherene Sires  2-3 months

## 2012-06-18 ENCOUNTER — Telehealth: Payer: Self-pay | Admitting: Medical

## 2012-06-18 NOTE — Telephone Encounter (Signed)
Please call patient. Patient wants samples of Benicar, proair, symbicort or any of her medications she takes

## 2012-06-18 NOTE — Telephone Encounter (Signed)
pls provide samples if we have them of those requested medications

## 2012-06-22 ENCOUNTER — Telehealth: Payer: Self-pay | Admitting: Family Medicine

## 2012-06-22 ENCOUNTER — Other Ambulatory Visit: Payer: Self-pay | Admitting: Family Medicine

## 2012-06-22 MED ORDER — OLMESARTAN MEDOXOMIL-HCTZ 20-12.5 MG PO TABS: 1.0000 | ORAL_TABLET | Freq: Every day | ORAL | Status: DC

## 2012-06-22 MED ORDER — LEVOTHYROXINE SODIUM 75 MCG PO TABS: 75.0000 ug | ORAL_TABLET | Freq: Every day | ORAL | Status: DC

## 2012-06-22 MED ORDER — BUDESONIDE-FORMOTEROL FUMARATE 160-4.5 MCG/ACT IN AERO: 2.0000 | INHALATION_SPRAY | Freq: Two times a day (BID) | RESPIRATORY_TRACT | Status: DC

## 2012-06-22 NOTE — Telephone Encounter (Signed)
Pt wants samples of synthroid and one has 1 box-------sl

## 2012-06-22 NOTE — Telephone Encounter (Signed)
Patient is aware that her samples are waiting for her up front. CLS

## 2012-07-01 ENCOUNTER — Telehealth: Payer: Self-pay | Admitting: Adult Health

## 2012-07-01 NOTE — Telephone Encounter (Signed)
Spoke with pt. She is c/o increased cough and SOB, also very hoarse. OV with TP for tomorrow am at 9:45. I advised to seek emergent care sooner if needed. She verbalized understanding.

## 2012-07-02 ENCOUNTER — Encounter: Payer: Self-pay | Admitting: Adult Health

## 2012-07-02 ENCOUNTER — Ambulatory Visit (INDEPENDENT_AMBULATORY_CARE_PROVIDER_SITE_OTHER): Payer: Self-pay | Admitting: Adult Health

## 2012-07-02 VITALS — BP 130/70 | HR 60 | Temp 97.1°F | Ht 67.0 in | Wt 249.0 lb

## 2012-07-02 DIAGNOSIS — D869 Sarcoidosis, unspecified: Secondary | ICD-10-CM

## 2012-07-02 MED ORDER — PROMETHAZINE-CODEINE 6.25-10 MG/5ML PO SYRP: 5.0000 mL | ORAL_SOLUTION | Freq: Four times a day (QID) | ORAL | Status: AC | PRN

## 2012-07-02 MED ORDER — ALBUTEROL SULFATE HFA 108 (90 BASE) MCG/ACT IN AERS: 2.0000 | INHALATION_SPRAY | RESPIRATORY_TRACT | Status: DC | PRN

## 2012-07-02 MED ORDER — PREDNISONE 20 MG PO TABS: 20.0000 mg | ORAL_TABLET | Freq: Every day | ORAL | Status: DC

## 2012-07-02 MED ORDER — BUDESONIDE-FORMOTEROL FUMARATE 160-4.5 MCG/ACT IN AERO: 2.0000 | INHALATION_SPRAY | Freq: Two times a day (BID) | RESPIRATORY_TRACT | Status: DC

## 2012-07-02 NOTE — Progress Notes (Signed)
Subjective:    Patient ID: Lisa [name], female    DOB: [birth year], 51 y.o.   MRN: [id number]  HPI 73 yobf quit smoking 03/2005 with chronic sarcoidosis manifested mostly arthritic complaints (started maybe in retrospect in 2004) prednisone -dependent since January 2008. Each time she lowers the dose in the past she has noticed a flare of her arthritis an does not respond to maximum doses of ibuprofen.    March 27, 2010 6 wk followup. Pt c/o cough x 3 wks. She states that it only happens at night when lies down. She states that cough is dry. She states that she has been postponing her pm dose of the symbicort 80 for when the cough occurs and this seems to help. no sob. arthritis ok on 5 mg every other day dosing. no ocular c/o or rash. rec try taper to q 3 d dosing and increase symbicort to 160 2bid   May 08, 2010 ov 6 wk followup. Pt states that her cough is better. Still has some cough in the am- prod with thick yellow sputum. She states had some itchy rash on her left leg a wk ago but this has resolved. actually on 2.5 mg q3d with no complaints on the days she misses pred dosing. >>Prednisone stopped.   July 04, 2010--Presents for est med calendar - pt brought all meds with her today. no new complaints. Last visit weaned off steroids. No flare since last vist. We reviewed her meds and updated her med calendar. . Got her flu shot yesterday. no change rx   October 04, 2010 ov no chang cc doe but hacky dry cough worse, feels like throat is irritated ? from symbicort esp in am. Change the way you use symbicort and only 12 hours if breathing difficulty or wheezing  Add pepcid 20 mg one at bedtime  Try chlortrimeton 4 mg one at bedtime   December 04, 2010 ov all better, rare need for symbicort, all smiles, no cough or sob. >>no changes   02/14/2011 follow up and med review Pt returns for follow up and med review. She is doing well since last visit . No flare in cough or dyspena.  Rare use of  ProAir.  Uses Symbicort As needed  . Rare use. No significant drainage or tickle in throat. Has  Stopped clortrimeton. Has zyrtec but not using it.   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 rec No change rx, follow the calendar provided    07/25/2011 f/u ov/Wert cc  Pt states no resp c/o's. She has had itchy  rash underneath arm x 6 wks, improving now. Did not bring med calendar - arthritis controlled with otcs.  No ocular co's no cough no sob rec Follow calendar  01/27/2012 f/u ov/Wert cc  No show   03/12/2012 Follow up  Doing well with no increased cough or dyspnea.  Does have arthritic symptoms in hips and back/legs, controlled on lyrica .  Insurance running out at end of May and is trying to get new insurance.  Last xray was 07/2011 with no acute changes.  >no changes   06/10/2012 Acute OV  Complains of persist cough for last 4 weeks. Was  Seen in ER -prednisone taper . She does not have insurance.  Complains of  hoarseness, prod cough with white and some yellow mucus x3weeks. No hemoptysis or edema.  No fever .  >>doxycycline /steroids   07/02/2012 Acute OV  Still having prod cough with white/yellow  mucus, increased SOB, hoarseness, tightness in chest, body aches since last ov .  Cough Improved but did not totally resolve.  Has sinus pain and pressure with ear fullness.  Drainage in throat.  Does not have insurance anymore and depends on samples if possible.  No hemoptysis or edema.  No fever.    ROS:  Constitutional:   No  weight loss, night sweats,  Fevers, chills,  +fatigue, or  lassitude.  HEENT:   No headaches,  Difficulty swallowing,  Tooth/dental problems, or  Sore throat,                No sneezing, itching +nasal congestion, post nasal drip,   CV:  No chest pain,  Orthopnea, PND, swelling in lower extremities, anasarca, dizziness, palpitations, syncope.   GI  No heartburn, indigestion, abdominal pain, nausea, vomiting, diarrhea, change in bowel  habits, loss of appetite, bloody stools.   Resp: No coughing up of blood.    No chest wall deformity  Skin: no rash or lesions.  GU: no dysuria, change in color of urine, no urgency or frequency.  No flank pain, no hematuria   MS:  No joint   swelling.      Psych:  No change in mood or affect. No depression or anxiety.  No memory loss.                    Objective:   Physical Exam  Wt 260 > 253 07/25/2011 > 01/27/2012  GEN: A/Ox3; pleasant , NAD, well nourished , obese  HEENT:  [place]/AT,  EACs-clear, TMs red/swollen , NOSE-clear drainage , sinus pain/pressure THROAT-clear, no lesions, no postnasal drip or exudate noted.   NECK:  Supple w/ fair ROM; no JVD; normal carotid impulses w/o bruits; no thyromegaly or nodules palpated; no lymphadenopathy.  RESP  Coarse BS w/ no wheezing.    CARD:  RRR, no m/r/g  , no peripheral edema, pulses intact, no cyanosis or clubbing.  GI:   Soft & nt; nml bowel sounds; no organomegaly or masses detected.  Musco: Warm bil, no deformities or joint swelling noted.   Neuro: alert, no focal deficits noted.    Skin: Warm, no lesions or rashes       CXR 05/19/12 (ER )  No radiographic evidence of acute cardiopulmonary disease.   Appearance of the lungs is similar to prior examinations, again  suggestive of scarring related to sarcoidosis.    Assessment & Plan:

## 2012-07-02 NOTE — Patient Instructions (Addendum)
Avelox 400mg  daily -take with food-samples given .  Delsym 2 tsp every 12 hr As needed  Cough .  Phenergan w/ Codeine 1-2 tsp every 4-6 hr As needed  Cough  Prednisone 20mg  daily until seen back in office  Please contact office for sooner follow up if symptoms do not improve or worsen or seek emergency care  Follow up Dr. Sherene Sires  2-3 weeks

## 2012-07-02 NOTE — Addendum Note (Signed)
Addended by: Boone Master E on: 07/02/2012 10:35 AM   Modules accepted: Orders

## 2012-07-02 NOTE — Assessment & Plan Note (Signed)
Flare with associated sinusitis   Plan Avelox 400mg  daily -take with food-samples given .  Delsym 2 tsp every 12 hr As needed  Cough .  Phenergan w/ Codeine 1-2 tsp every 4-6 hr As needed  Cough  Prednisone 20mg  daily until seen back in office  Please contact office for sooner follow up if symptoms do not improve or worsen or seek emergency care  Follow up Dr. Sherene Sires  2-3 weeks

## 2012-07-02 NOTE — Addendum Note (Signed)
Addended by: Abigail Miyamoto D on: 07/02/2012 10:35 AM   Modules accepted: Orders

## 2012-07-22 ENCOUNTER — Other Ambulatory Visit: Payer: Self-pay | Admitting: *Deleted

## 2012-07-22 DIAGNOSIS — I10 Essential (primary) hypertension: Secondary | ICD-10-CM

## 2012-07-22 MED ORDER — OLMESARTAN MEDOXOMIL-HCTZ 20-12.5 MG PO TABS: 1.0000 | ORAL_TABLET | Freq: Every day | ORAL | Status: DC

## 2012-07-23 ENCOUNTER — Ambulatory Visit (INDEPENDENT_AMBULATORY_CARE_PROVIDER_SITE_OTHER): Payer: Self-pay | Admitting: Internal Medicine

## 2012-07-23 ENCOUNTER — Encounter: Payer: Self-pay | Admitting: Internal Medicine

## 2012-07-23 VITALS — BP 124/70 | HR 65 | Temp 97.8°F | Ht 67.0 in | Wt 252.0 lb

## 2012-07-23 DIAGNOSIS — R059 Cough, unspecified: Secondary | ICD-10-CM | POA: Insufficient documentation

## 2012-07-23 DIAGNOSIS — R05 Cough: Secondary | ICD-10-CM

## 2012-07-23 DIAGNOSIS — D869 Sarcoidosis, unspecified: Secondary | ICD-10-CM

## 2012-07-23 MED ORDER — FAMOTIDINE 20 MG PO TABS: ORAL_TABLET | ORAL | Status: DC

## 2012-07-23 MED ORDER — ACETAMINOPHEN-CODEINE #3 300-30 MG PO TABS: 1.0000 | ORAL_TABLET | ORAL | Status: DC | PRN

## 2012-07-23 NOTE — Progress Notes (Signed)
Subjective:    Patient ID: Lisa [name], female    DOB: 1960/12/21, 51 y.o.   MRN: [id number]  HPI 74 yobf quit smoking 03/2005 with chronic sarcoidosis manifested mostly arthritic complaints (started maybe in retrospect in 2004) prednisone -dependent since January 2008. Each time she lowers the dose in the past she has noticed a flare of her arthritis an does not respond to maximum doses of ibuprofen.    March 27, 2010 6 wk followup. Pt c/o cough x 3 wks. She states that it only happens at night when lies down. She states that cough is dry. She states that she has been postponing her pm dose of the symbicort 80 for when the cough occurs and this seems to help. no sob. arthritis ok on 5 mg every other day dosing. no ocular c/o or rash. rec try taper to q 3 d dosing and increase symbicort to 160 2bid   May 08, 2010 ov 6 wk followup. Pt states that her cough is better. Still has some cough in the am- prod with thick yellow sputum. She states had some itchy rash on her left leg a wk ago but this has resolved. actually on 2.5 mg q3d with no complaints on the days she misses pred dosing. >>Prednisone stopped.   July 04, 2010--Presents for est med calendar - pt brought all meds with her today. no new complaints. Last visit weaned off steroids. No flare since last vist. We reviewed her meds and updated her med calendar. . Got her flu shot yesterday. no change rx   October 04, 2010 ov no chang cc doe but hacky dry cough worse, feels like throat is irritated ? from symbicort esp in am. Change the way you use symbicort and only 12 hours if breathing difficulty or wheezing  Add pepcid 20 mg one at bedtime  Try chlortrimeton 4 mg one at bedtime   December 04, 2010 ov all better, rare need for symbicort, all smiles, no cough or sob. >>no changes   02/14/2011 follow up and med review Pt returns for follow up and med review. She is doing well since last visit . No flare in cough or dyspena.  Rare use of  ProAir.  Uses Symbicort As needed  . Rare use. No significant drainage or tickle in throat. Has  Stopped clortrimeton. Has zyrtec but not using it.   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 rec No change rx, follow the calendar provided    07/25/2011 f/u ov/Luther Newhouse cc  Pt states no resp c/o's. She has had itchy  rash underneath arm x 6 wks, improving now. Did not bring med calendar - arthritis controlled with otcs.  No ocular co's no cough no sob rec Follow calendar  01/27/2012 f/u ov/Elvera Almario cc  No show   03/12/2012 Follow up  Doing well with no increased cough or dyspnea.  Does have arthritic symptoms in hips and back/legs, controlled on lyrica .  Insurance running out at end of May and is trying to get new insurance.  Last xray was 07/2011 with no acute changes.  >no changes   06/10/2012 Acute OV  Complains of persist cough for last 4 weeks. Was  Seen in ER -prednisone taper . She does not have insurance.  Complains of  hoarseness, prod cough with white and some yellow mucus x3 weeks. No hemoptysis or edema.  No fever .  >>doxycycline /steroids   07/02/2012 Acute OV  Still having prod cough with  white/yellow mucus, increased SOB, hoarseness, tightness in chest, body aches since last ov .  Cough Improved but did not totally resolve.  Has sinus pain and pressure with ear fullness.  Drainage in throat rec Avelox 400mg  daily -take with food-samples given .  Delsym 2 tsp every 12 hr As needed  Cough .  Phenergan w/ Codeine 1-2 tsp every 4-6 hr As needed  Cough  Prednisone 20mg  daily until seen back in office   07/23/2012 f/u ov/Johnnae Impastato did not bring calendar as requested cc refractory dry cough dating back to July 2013 not responding to prednisone at 20 mg and symbicort 160 2 bid, no purulent sputum, no rash or arthralgias, no better p avelox. No sob unless coughing.  Sleeping ok without nocturnal  or early am exacerbation  of respiratory  c/o's or need for noct saba. Also denies any  obvious fluctuation of symptoms with weather or environmental changes or other aggravating or alleviating factors except as outlined above                       Objective:   Physical Exam  Wt 260 > 253 07/25/2011 >  07/23/2012  252  GEN: A/Ox3; pleasant , NAD, well nourished , obese  HEENT:  [place]/AT,  EACs-clear, TMs nl NOSE-clear drainage , sinus pain/pressure THROAT-clear, no lesions, no postnasal drip or exudate noted.   NECK:  Supple w/ fair ROM; no JVD; normal carotid impulses w/o bruits; no thyromegaly or nodules palpated; no lymphadenopathy.  RESP  Clear bilateral  BS w/ no wheezing.    CARD:  RRR, no m/r/g  , no peripheral edema, pulses intact, no cyanosis or clubbing.  GI:   Soft & nt; nml bowel sounds; no organomegaly or masses detected.  Musco: Warm bil, no deformities or joint swelling noted.   Neuro: alert, no focal deficits noted.    Skin: Warm, no lesions or rashes       CXR 05/19/12 (ER )  No radiographic evidence of acute cardiopulmonary disease.   Appearance of the lungs is similar to prior examinations, again  suggestive of scarring related to sarcoidosis.    Assessment & Plan:

## 2012-07-23 NOTE — Patient Instructions (Addendum)
Stop symbicort Add pepcid  20 mg one at bedtime Prednisone 10 mg one with breakfast Take delsym two tsp every 12 hours and supplement if needed with Tylenol #3  to 2 every 4 hours to suppress the urge to cough. Swallowing water or using ice chips/non mint and menthol containing candies (such as lifesavers or sugarless jolly ranchers) are also effective.  You should rest your voice and avoid activities that you know make you cough.  Once you have eliminated the cough for 3 straight days try reducing the Tylenol #3 first,  then the delsym as tolerated.    GERD (REFLUX)  is an extremely common cause of respiratory symptoms, many times with no significant heartburn at all.    It can be treated with medication, but also with lifestyle changes including avoidance of late meals, excessive alcohol, smoking cessation, and avoid fatty foods, chocolate, peppermint, colas, red wine, and acidic juices such as orange juice.  NO MINT OR MENTHOL PRODUCTS SO NO COUGH DROPS  USE SUGARLESS CANDY INSTEAD (sugarless jolley ranchers or Stover's)  NO OIL BASED VITAMINS - use powdered substitutes.    See Tammy NP w/in 2 weeks with all your medications and your med calendar, even over the counter meds, separated in two separate bags, the ones you take no matter what vs the ones you stop once you feel better and take only as needed when you feel you need them.   Tammy  will generate for you a new user friendly medication calendar that will put [place] all on the same page re: your medication use.     Without this process, it simply isn't possible to assure that we are providing  your outpatient care  with  the attention to detail we feel you deserve.   If we cannot assure that you're getting that kind of care,  then we cannot manage your problem effectively from this clinic.  Once you have seen Tammy and we are sure that we're all on the same page with your medication use she will arrange follow up with me.

## 2012-07-23 NOTE — Progress Notes (Deleted)
Subjective:    Patient ID: Lisa [name], female    DOB: 02/04/1961, 51 y.o.   MRN: [id number]  HPI 81 yobf quit smoking 03/2005 with chronic sarcoidosis manifested mostly arthritic complaints (started maybe in retrospect in 2004) prednisone -dependent since January 2008. Each time Lisa [name] lowers the dose in the past Lisa [name] has noticed a flare of her arthritis an does not respond to maximum doses of ibuprofen.    March 27, 2010 6 wk followup. Pt c/o cough x 3 wks. Lisa [name] states that it only happens at night when lies down. Lisa [name] states that cough is dry. Lisa [name] states that Lisa [name] has been postponing her pm dose of the symbicort 80 for when the cough occurs and this seems to help. no sob. arthritis ok on 5 mg every other day dosing. no ocular c/o or rash. rec try taper to q 3 d dosing and increase symbicort to 160 2bid   May 08, 2010 ov 6 wk followup. Pt states that her cough is better. Still has some cough in the am- prod with thick yellow sputum. Lisa [name] states had some itchy rash on her left leg a wk ago but this has resolved. actually on 2.5 mg q3d with no complaints on the days Lisa [name] misses pred dosing. >>Prednisone stopped.   July 04, 2010--Presents for est med calendar - pt brought all meds with her today. no new complaints. Last visit weaned off steroids. No flare since last vist. We reviewed her meds and updated her med calendar. . Got her flu shot yesterday. no change rx   October 04, 2010 ov no chang cc doe but hacky dry cough worse, feels like throat is irritated ? from symbicort esp in am. Change the way you use symbicort and only 12 hours if breathing difficulty or wheezing  Add pepcid 20 mg one at bedtime  Try chlortrimeton 4 mg one at bedtime   December 04, 2010 ov all better, rare need for symbicort, all smiles, no cough or sob. >>no changes   02/14/2011 follow up and med review Pt returns for follow up and med review. Lisa [name] is doing well since last visit . No flare in cough or dyspena.  Rare use of  ProAir.  Uses Symbicort As needed  . Rare use. No significant drainage or tickle in throat. Has  Stopped clortrimeton. Has zyrtec but not using it.   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 rec No change rx, follow the calendar provided    07/25/2011 f/u ov/Recie Cirrincione cc  Pt states no resp c/o's. Lisa [name] has had itchy  rash underneath arm x 6 wks, improving now. Did not bring med calendar - arthritis controlled with otcs.  No ocular co's no cough no sob rec Follow calendar  01/27/2012 f/u ov/Carrolyn Hilmes cc  No show   03/12/2012 Follow up  Doing well with no increased cough or dyspnea.  Does have arthritic symptoms in hips and back/legs, controlled on lyrica .  Insurance running out at end of May and is trying to get new insurance.  Last xray was 07/2011 with no acute changes.  >no changes   06/10/2012 Acute OV  Complains of persist cough for last 4 weeks. Was  Seen in ER -prednisone taper . Lisa [name] does not have insurance.  Complains of  hoarseness, prod cough with white and some yellow mucus x3weeks. No hemoptysis or edema.  No fever .  >>doxycycline /steroids   07/02/2012 Acute OV  Still having prod cough with white/yellow  mucus, increased SOB, hoarseness, tightness in chest, body aches since last ov .  Cough Improved but did not totally resolve.  Has sinus pain and pressure with ear fullness.  Drainage in throat.  Does not have insurance anymore and depends on samples if possible.  No hemoptysis or edema.  No fever.  rec Avelox 400mg  daily -take with food-samples given .  Delsym 2 tsp every 12 hr As needed  Cough .  Phenergan w/ Codeine 1-2 tsp every 4-6 hr As needed  Cough  Prednisone 20mg  daily until seen back in office   07/23/2012 f/u ov/Argelia Formisano cc cough since May 19 2012 assoc with hoarseness and sensation of globus and already in ER and by NP using mint gum, not using med calendar                       Objective:   Physical Exam  Wt 260 > 253 07/25/2011 >  252  07/23/2012  GEN: A/Ox3; pleasant , NAD, well nourished , obese  HEENT:  [place]/AT,  EACs-clear, TMs red/swollen , NOSE-clear drainage , sinus pain/pressure THROAT-clear, no lesions, no postnasal drip or exudate noted.   NECK:  Supple w/ fair ROM; no JVD; normal carotid impulses w/o bruits; no thyromegaly or nodules palpated; no lymphadenopathy.  RESP  Coarse BS w/ no wheezing.    CARD:  RRR, no m/r/g  , no peripheral edema, pulses intact, no cyanosis or clubbing.  GI:   Soft & nt; nml bowel sounds; no organomegaly or masses detected.  Musco: Warm bil, no deformities or joint swelling noted.   Neuro: alert, no focal deficits noted.    Skin: Warm, no lesions or rashes       CXR 05/19/12 (ER )  No radiographic evidence of acute cardiopulmonary disease.   Appearance of the lungs is similar to prior examinations, again  suggestive of scarring related to sarcoidosis.    Assessment & Plan:

## 2012-07-23 NOTE — Assessment & Plan Note (Addendum)
Not convinced any of her symptoms are classic sarcoidosis  The goal with a chronic steroid dependent illness is always arriving at the lowest effective dose that controls the disease/symptoms and not accepting a set "formula" which is based on statistics or guidelines that don't always take into account patient  variability or the natural hx of the dz in every individual patient, which may well vary over time.  For now therefore I recommend the patient maintain  10 mg per day until cough resolves then taper off    Each maintenance medication was reviewed in detail including most importantly the difference between maintenance and as needed and under what circumstances the prns are to be used. This was done in the context of a medication calendar review which provided the patient with a user-friendly unambiguous mechanism for medication administration and reconciliation and provides an action plan for all active problems. It is critical that this be shown to every doctor  for modification during the office visit if necessary so the patient can use it as a working document.

## 2012-07-23 NOTE — Assessment & Plan Note (Signed)
Of the three most common causes of chronic cough, only one (GERD)  can actually cause the other two (asthma and post nasal drip syndrome)  and perpetuate the cylce of cough inducing airway trauma, inflammation, heightened sensitivity to reflux which is prompted by the cough itself via a cyclical mechanism.    This may partially respond to steroids and look like asthma and post nasal drainage but never erradicated completely unless the cough and the secondary reflux are eliminated, preferably both at the same time.  While not intuitively obvious, many patients with chronic low grade reflux do not cough until there is a secondary insult that disturbs the protective epithelial barrier and exposes sensitive nerve endings.  This can be viral or direct physical injury such as with an endotracheal tube.   The point is that once this occurs, it is difficult to eliminate using anything but a maximally effective acid suppression regimen at least in the short run, accompanied by an appropriate diet to address non acid GERD.   For now max rx for gerd then regroup Try off symbicort and just continue prednisone 10 mg daily and prn proaire

## 2012-08-06 ENCOUNTER — Encounter: Payer: Self-pay | Admitting: Adult Health

## 2012-08-06 ENCOUNTER — Ambulatory Visit (INDEPENDENT_AMBULATORY_CARE_PROVIDER_SITE_OTHER): Payer: Self-pay | Admitting: Adult Health

## 2012-08-06 VITALS — BP 122/62 | HR 68 | Temp 96.9°F | Ht 67.0 in | Wt 253.6 lb

## 2012-08-06 DIAGNOSIS — D869 Sarcoidosis, unspecified: Secondary | ICD-10-CM

## 2012-08-06 NOTE — Progress Notes (Signed)
Subjective:    Patient ID: Lisa [name], female    DOB: 1961/08/09, 51 y.o.   MRN: [id number]  HPI 9 yobf quit smoking 03/2005 with chronic sarcoidosis manifested mostly arthritic complaints (started maybe in retrospect in 2004) prednisone -dependent since January 2008. Each time she lowers the dose in the past she has noticed a flare of her arthritis an does not respond to maximum doses of ibuprofen.    March 27, 2010 6 wk followup. Pt c/o cough x 3 wks. She states that it only happens at night when lies down. She states that cough is dry. She states that she has been postponing her pm dose of the symbicort 80 for when the cough occurs and this seems to help. no sob. arthritis ok on 5 mg every other day dosing. no ocular c/o or rash. rec try taper to q 3 d dosing and increase symbicort to 160 2bid   May 08, 2010 ov 6 wk followup. Pt states that her cough is better. Still has some cough in the am- prod with thick yellow sputum. She states had some itchy rash on her left leg a wk ago but this has resolved. actually on 2.5 mg q3d with no complaints on the days she misses pred dosing. >>Prednisone stopped.   July 04, 2010--Presents for est med calendar - pt brought all meds with her today. no new complaints. Last visit weaned off steroids. No flare since last vist. We reviewed her meds and updated her med calendar. . Got her flu shot yesterday. no change rx   October 04, 2010 ov no chang cc doe but hacky dry cough worse, feels like throat is irritated ? from symbicort esp in am. Change the way you use symbicort and only 12 hours if breathing difficulty or wheezing  Add pepcid 20 mg one at bedtime  Try chlortrimeton 4 mg one at bedtime   December 04, 2010 ov all better, rare need for symbicort, all smiles, no cough or sob. >>no changes   02/14/2011 follow up and med review Pt returns for follow up and med review. She is doing well since last visit . No flare in cough or dyspena.  Rare use of  ProAir.  Uses Symbicort As needed  . Rare use. No significant drainage or tickle in throat. Has  Stopped clortrimeton. Has zyrtec but not using it.   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 She has had itchy  rash underneath arm x 6 wks, improving now. Did not bring med calendar - arthritis controlled with otcs.  No ocular co's no cough no sob rec Follow calendar  01/27/2012 f/u ov/Wert cc  No show   03/12/2012 Follow up  Doing well with no increased cough or dyspnea.  Does have arthritic symptoms in hips and back/legs, controlled on lyrica .  Insurance running out at end of May and is trying to get new insurance.  Last xray was 07/2011 with no acute changes.  >no changes   06/10/2012 Acute OV  Complains of persist cough for last 4 weeks. Was  Seen in ER -prednisone taper . She does not have insurance.  Complains of  hoarseness, prod cough with white and some yellow mucus x3 weeks. No hemoptysis or edema.  No fever .  >>doxycycline /steroids   07/02/2012 Acute OV  Still having prod cough with  white/yellow mucus, increased SOB, hoarseness, tightness in chest, body aches since last ov .  Cough Improved but did not totally resolve.  Has sinus pain and pressure with ear fullness.  Drainage in throat rec Avelox 400mg  daily -take with food-samples given .  Delsym 2 tsp every 12 hr As needed  Cough .  Phenergan w/ Codeine 1-2 tsp every 4-6 hr As needed  Cough  Prednisone 20mg  daily until seen back in office   07/23/2012 f/u ov/Wert did not bring calendar as requested cc refractory dry cough dating back to July 2013 not responding to prednisone at 20 mg and symbicort 160 2 bid, no purulent sputum, no rash or arthralgias, no better p avelox. No sob unless coughing. >>symbicort stopped and pred held at 10mg    08/06/2012 Follow up and med review  Patient returns for a two-week  followup for her sarcoid and cough. We reviewed all her medication and organized them into a medication calendar with patient education. It appears that she has some confusion w/ As needed  meds and cough control regimen.   At last visit. Patient was recommended to discontinue her Symbicort, and decrease her prednisone to 10 mg daily. She was also placed on a cough suppression regimen with Delsym and Tylenol. #3 She does feel some better however, continues to have some intermittent cough, however, has not been taking her Delsym on a consistent basis as previously recommended  ROS :  Constitutional:   No  weight loss, night sweats,  Fevers, chills, fatigue, or  lassitude.  HEENT:   No headaches,  Difficulty swallowing,  Tooth/dental problems, or  Sore throat,                No sneezing, itching, ear ache, nasal congestion, post nasal drip,   CV:  No chest pain,  Orthopnea, PND, swelling in lower extremities, anasarca, dizziness, palpitations, syncope.   GI  No heartburn, indigestion, abdominal pain, nausea, vomiting, diarrhea, change in bowel habits, loss of appetite, bloody stools.   Resp:   No coughing up of blood.     No chest wall deformity  Skin: no rash or lesions.  GU: no dysuria, change in color of urine, no urgency or frequency.  No flank pain, no hematuria   MS:  No joint pain or swelling.  No decreased range of motion.     Psych:  No change in mood or affect. No depression or anxiety.  No memory loss.                       Objective:   Physical Exam  Wt 260 > 253 07/25/2011 >  07/23/2012  252 >253 08/06/2012   GEN: A/Ox3; pleasant , NAD, well nourished , obese  HEENT:  [place]/AT,  EACs-clear, TMs nl NOSE-clear drainage , sinus pain/pressure THROAT-clear, no lesions, no postnasal drip or exudate noted.   NECK:  Supple w/ fair ROM; no JVD; normal carotid impulses w/o bruits; no thyromegaly or nodules palpated; no lymphadenopathy.  RESP  Clear bilateral  BS w/  no wheezing.    CARD:  RRR, no m/r/g  , no peripheral edema, pulses intact, no cyanosis or clubbing.  GI:   Soft & nt; nml bowel sounds; no organomegaly or masses detected.  Musco: Warm bil, no deformities or joint swelling noted.   Neuro: alert, no focal deficits noted.    Skin: Warm, no lesions or rashes       CXR 05/19/12 (ER )  No radiographic evidence of acute cardiopulmonary disease.   Appearance of the lungs is similar to prior examinations, again  suggestive of scarring related to sarcoidosis.    Assessment & Plan:

## 2012-08-06 NOTE — Assessment & Plan Note (Signed)
Recent flare with cough -slowly improving w/ steroids and cough control regimen.  Patient's medications were reviewed today and patient education was given. Computerized medication calendar was adjusted/completed   Plan;  Cont on current regimen.  Advised on cough suppression regimen along with delsym and Tylenol #3 follow up Dr. Sherene Sires  In 1 month and. As needed   Keep prednisone at 10mg  daily for now

## 2012-08-06 NOTE — Patient Instructions (Addendum)
Follow med calendar closely and bring to each visit.  Continue on current prednisone at 10mg  daily  Try to get ahead of the cough with Delsym and Tylenol #3  follow up Dr. Sherene Sires  In 3-4 weeks and As needed   Please contact office for sooner follow up if symptoms do not improve or worsen or seek emergency care  Flu shot

## 2012-08-10 ENCOUNTER — Telehealth: Payer: Self-pay | Admitting: Family Medicine

## 2012-08-10 MED ORDER — PRAVASTATIN SODIUM 40 MG PO TABS: 40.0000 mg | ORAL_TABLET | Freq: Every day | ORAL | Status: DC

## 2012-08-10 MED ORDER — PANTOPRAZOLE SODIUM 40 MG PO TBEC: 40.0000 mg | DELAYED_RELEASE_TABLET | Freq: Every day | ORAL | Status: DC

## 2012-08-10 MED ORDER — LEVOTHYROXINE SODIUM 75 MCG PO TABS: 75.0000 ug | ORAL_TABLET | Freq: Every day | ORAL | Status: DC

## 2012-08-10 NOTE — Telephone Encounter (Signed)
Sent meds in

## 2012-08-12 NOTE — Addendum Note (Signed)
Addended by: Boone Master E on: 08/12/2012 01:13 PM   Modules accepted: Orders

## 2012-08-13 ENCOUNTER — Ambulatory Visit: Payer: Self-pay | Admitting: Internal Medicine

## 2012-08-16 ENCOUNTER — Ambulatory Visit
Admission: RE | Admit: 2012-08-16 | Discharge: 2012-08-16 | Disposition: A | Discharge status: Home / Self Care | Payer: Self-pay | Source: Ambulatory Visit | Attending: Family Medicine | Admitting: Family Medicine

## 2012-08-16 DIAGNOSIS — Z1231 Encounter for screening mammogram for malignant neoplasm of breast: Secondary | ICD-10-CM

## 2012-08-18 ENCOUNTER — Institutional Professional Consult (permissible substitution): Payer: Self-pay | Admitting: Pulmonary Disease

## 2012-08-27 ENCOUNTER — Ambulatory Visit (INDEPENDENT_AMBULATORY_CARE_PROVIDER_SITE_OTHER): Payer: Self-pay | Admitting: Internal Medicine

## 2012-08-27 ENCOUNTER — Telehealth: Payer: Self-pay | Admitting: Internal Medicine

## 2012-08-27 ENCOUNTER — Ambulatory Visit (INDEPENDENT_AMBULATORY_CARE_PROVIDER_SITE_OTHER)
Admission: RE | Admit: 2012-08-27 | Discharge: 2012-08-27 | Disposition: A | Discharge status: Home / Self Care | Payer: Self-pay | Source: Ambulatory Visit | Attending: Internal Medicine | Admitting: Internal Medicine

## 2012-08-27 ENCOUNTER — Encounter: Payer: Self-pay | Admitting: Internal Medicine

## 2012-08-27 VITALS — BP 108/70 | HR 74 | Temp 97.8°F | Ht 67.0 in | Wt 250.0 lb

## 2012-08-27 DIAGNOSIS — D869 Sarcoidosis, unspecified: Secondary | ICD-10-CM

## 2012-08-27 DIAGNOSIS — R05 Cough: Secondary | ICD-10-CM

## 2012-08-27 DIAGNOSIS — R059 Cough, unspecified: Secondary | ICD-10-CM

## 2012-08-27 MED ORDER — ACETAMINOPHEN-CODEINE #3 300-30 MG PO TABS: 1.0000 | ORAL_TABLET | ORAL | Status: DC | PRN

## 2012-08-27 MED ORDER — PREDNISONE (PAK) 10 MG PO TABS: ORAL_TABLET | ORAL | Status: DC

## 2012-08-27 NOTE — Telephone Encounter (Signed)
Spoke with pt. Per MW pred taper was sent this am, and then the pt was to stop med after finished. I verified that the rx was sent. Spoke with the pt and made aware. She verbalized understanding and states nothing further needed.

## 2012-08-27 NOTE — Patient Instructions (Addendum)
The key to effective treatment for your cough is eliminating the non-stop cycle of cough you're stuck in long enough to let your airway heal completely and then see if there is anything still making you cough once you stop the cough suppression, but this should take no more than 5 days to figuure out  First take delsym two tsp every 12 hours and supplement if needed with tylenol #3 up to 2 every 4 hours to suppress the urge to cough. Swallowing water or using ice chips/non mint and menthol containing candies (such as lifesavers or sugarless jolly ranchers) are also effective.  You should rest your voice and avoid activities that you know make you cough.  Once you have eliminated the cough for 3 straight days try reducing the tylenol #3 first,  then the delsym as tolerated.     GERD (REFLUX)  is an extremely common cause of respiratory symptoms, many times with no significant heartburn at all.    It can be treated with medication, but also with lifestyle changes including avoidance of late meals, excessive alcohol, smoking cessation, and avoid fatty foods, chocolate, peppermint, colas, red wine, and acidic juices such as orange juice.  NO MINT OR MENTHOL PRODUCTS SO NO COUGH DROPS  USE SUGARLESS CANDY INSTEAD (jolley ranchers or Stover's)  NO OIL BASED VITAMINS - use powdered substitutes.  Prednisone 10 mg take  4 each am x 2 days,   2 each am x 2 days,  1 each am x2days and stop   If not better in 2 weeks return with all medications in two separate bags, your maintenance (plan A) vs your as neededs (plan B)    Prednisone 10 mg take  4 each am x 2 days,   2 each am x 2 days,  1 each am x2days and stop

## 2012-08-27 NOTE — Progress Notes (Signed)
Subjective:    Patient ID: Lisa [name], female    DOB: 1961/06/28    MRN: [id number]  HPI 59 yobf quit smoking 03/2005 with chronic sarcoidosis manifested mostly arthritic complaints (started maybe in retrospect in 2004) prednisone -dependent since January 2008. Each time she lowers the dose in the past she has noticed a flare of her arthritis an does not respond to maximum doses of ibuprofen.    March 27, 2010 6 wk followup. Pt c/o cough x 3 wks. She states that it only happens at night when lies down. She states that cough is dry. She states that she has been postponing her pm dose of the symbicort 80 for when the cough occurs and this seems to help. no sob. arthritis ok on 5 mg every other day dosing. no ocular c/o or rash. rec try taper to q 3 d dosing and increase symbicort to 160 2bid   May 08, 2010 ov 6 wk followup. Pt states that her cough is better. Still has some cough in the am- prod with thick yellow sputum. She states had some itchy rash on her left leg a wk ago but this has resolved. actually on 2.5 mg q3d with no complaints on the days she misses pred dosing. >>Prednisone stopped.   July 04, 2010--Presents for est med calendar - pt brought all meds with her today. no new complaints. Last visit weaned off steroids. No flare since last vist. We reviewed her meds and updated her med calendar. . Got her flu shot yesterday. no change rx   October 04, 2010 ov no chang cc doe but hacky dry cough worse, feels like throat is irritated ? from symbicort esp in am. Change the way you use symbicort and only 12 hours if breathing difficulty or wheezing  Add pepcid 20 mg one at bedtime  Try chlortrimeton 4 mg one at bedtime   December 04, 2010 ov all better, rare need for symbicort, all smiles, no cough or sob. >>no changes   02/14/2011 follow up and med review Pt returns for follow up and med review. She is doing well since last visit . No flare in cough or dyspena.  Rare use of ProAir.   Uses Symbicort As needed  . Rare use. No significant drainage or tickle in throat. Has  Stopped clortrimeton. Has zyrtec but not using it.   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 rec No change rx, follow the calendar provided    07/25/2011 f/u ov/Dim Meisinger cc  Pt states no resp c/o's. She has had itchy  rash underneath arm x 6 wks, improving now. Did not bring med calendar - arthritis controlled with otcs.  No ocular co's no cough no sob rec Follow calendar  01/27/2012 f/u ov/Massey Ruhland cc  No show   03/12/2012 Follow up  Doing well with no increased cough or dyspnea.  Does have arthritic symptoms in hips and back/legs, controlled on lyrica .  Insurance running out at end of May and is trying to get new insurance.  Last xray was 07/2011 with no acute changes.  >no changes   06/10/2012 Acute OV  Complains of persist cough for last 4 weeks. Was  Seen in ER -prednisone taper . She does not have insurance.  Complains of  hoarseness, prod cough with white and some yellow mucus x3 weeks. No hemoptysis or edema.  No fever .  >>doxycycline /steroids   07/02/2012 Acute OV  Still having prod cough with white/yellow  mucus, increased SOB, hoarseness, tightness in chest, body aches since last ov .  Cough Improved but did not totally resolve.  Has sinus pain and pressure with ear fullness.  Drainage in throat rec Avelox 400mg  daily -take with food-samples given .  Delsym 2 tsp every 12 hr As needed  Cough .  Phenergan w/ Codeine 1-2 tsp every 4-6 hr As needed  Cough  Prednisone 20mg  daily until seen back in office   07/23/2012 f/u ov/Evianna Chandran did not bring calendar as requested cc refractory dry cough dating back to July 2013 not responding to prednisone at 20 mg and symbicort 160 2 bid, no purulent sputum, no rash or arthralgias, no better p avelox. No sob unless coughing. >>symbicort stopped and pred held at 10mg    08/06/2012 Follow up and med review / NP Patient returns for a two-week followup  for her sarcoid and cough. We reviewed all her medication and organized them into a medication calendar with patient education. It appears that she has some confusion w/ As needed  meds and cough control regimen.   At last visit. Patient was recommended to discontinue her Symbicort, and decrease her prednisone to 10 mg daily. She was also placed on a cough suppression regimen with Delsym and Tylenol. #3 She does feel some better however, continues to have some intermittent cough, however, has not been taking her Delsym on a consistent basis as previously recommended rec Follow med calendar closely and bring to each visit.  Continue on current prednisone at 10mg  daily  Try to get ahead of the cough with Delsym and Tylenol #3  08/27/2012 f/u ov/Demetrie Borge cc cough no change, ran out of prednisone x 3 days, not using delsym because it makes her sleepy but takes zyrtec in am instead of hs as per med calendar, which she says she's following.  Cough is honking, dry, No obvious daytime variabilty or assoc sob or cp or chest tightness, subjective wheeze overt sinus or hb symptoms. No unusual exp hx or h/o childhood pna/ asthma or premature birth to his knowledge.   Sleeping ok without nocturnal  or early am exacerbation  of respiratory  c/o's or need for noct saba. Also denies any obvious fluctuation of symptoms with weather or environmental changes or other aggravating or alleviating factors except as outlined above   ROS  The following are not active complaints unless bolded sore throat, dysphagia, dental problems, itching, sneezing,  nasal congestion or excess/ purulent secretions, ear ache,   fever, chills, sweats, unintended wt loss, pleuritic or exertional cp, hemoptysis,  orthopnea pnd or leg swelling, presyncope, palpitations, heartburn, abdominal pain, anorexia, nausea, vomiting, diarrhea  or change in bowel or urinary habits, change in stools or urine, dysuria,hematuria,  rash, arthralgias, visual  complaints, headache, numbness weakness or ataxia or problems with walking or coordination,  change in mood/affect or memory.       Objective:   Physical Exam  Wt 260 > 253 07/25/2011 >  07/23/2012  252 >253 08/06/2012 > 08/27/2012  250  GEN: A/Ox3; pleasant , NAD, well nourished , obese  HEENT:  [place]/AT,  EACs-clear, TMs nl NOSE-clear drainage , sinus pain/pressure THROAT-clear, no lesions, no postnasal drip or exudate noted.   NECK:  Supple w/ fair ROM; no JVD; normal carotid impulses w/o bruits; no thyromegaly or nodules palpated; no lymphadenopathy.  RESP  Clear bilateral  BS w/ no wheezing.    CARD:  RRR, no m/r/g  , no peripheral edema, pulses intact, no cyanosis or  clubbing.  GI:   Soft & nt; nml bowel sounds; no organomegaly or masses detected.  Musco: Warm bil, no deformities or joint swelling noted.   Neuro: alert, no focal deficits noted.    Skin: Warm, no lesions or rashes       CXR 05/19/12 (ER )  No radiographic evidence of acute cardiopulmonary disease.  Appearance of the lungs is similar to prior examinations, again  suggestive of scarring related to sarcoidosis.    Assessment & Plan:

## 2012-08-28 NOTE — Assessment & Plan Note (Signed)
-   Sinus CT 08/27/2012  1. Chronic left maxillary sinus disease. The left maxillary sinus is small, likely representing a shrunken sinus secondary to the chronic disease as well as the sequelae of remote trauma. 2. No other significant sinus disease.  Most likely this is a form of  Classic Upper airway cough syndrome, so named because it's frequently impossible to sort out how much is  CR/sinusitis with freq throat clearing (which can be related to primary GERD)   vs  causing  secondary (" extra esophageal")  GERD from wide swings in gastric pressure that occur with throat clearing, often  promoting self use of mint and menthol lozenges that reduce the lower esophageal sphincter tone and exacerbate the problem further in a cyclical fashion.   These are the same pts (now being labeled as having "irritable larynx syndrome" by some cough centers) who not infrequently have a history of having failed to tolerate ace inhibitors,  dry powder inhalers or biphosphonates or report having atypical reflux symptoms that don't respond to standard doses of PPI , and are easily confused as having aecopd or asthma flares by even experienced allergists/ pulmonologists.   For now continue max rx for gerd, eliminate cyclical coughing then regroup with perhaps a methacholine challenge test next    Each maintenance medication was reviewed in detail including most importantly the difference between maintenance and as needed and under what circumstances the prns are to be used. This was done in the context of a medication calendar review which provided the patient with a user-friendly unambiguous mechanism for medication administration and reconciliation and provides an action plan for all active problems. It is critical that this be shown to every doctor  for modification during the office visit if necessary so the patient can use it as a working document.

## 2012-08-28 NOTE — Assessment & Plan Note (Signed)
Strongly doubt this has anything at all to do with her present symptoms which are upper airway in nature and do not appear steroid responsive so plan to taper this off completely then regroup

## 2012-08-31 ENCOUNTER — Telehealth: Payer: Self-pay | Admitting: Internal Medicine

## 2012-08-31 NOTE — Progress Notes (Signed)
Quick Note:  Spoke with pt and notified of results per Dr. Wert. Pt verbalized understanding and denied any questions.  ______ 

## 2012-08-31 NOTE — Telephone Encounter (Signed)
Spoke with pt and notified of results per Dr. Wert. Pt verbalized understanding and denied any questions. 

## 2012-09-10 ENCOUNTER — Telehealth: Payer: Self-pay | Admitting: Internal Medicine

## 2012-09-10 DIAGNOSIS — I10 Essential (primary) hypertension: Secondary | ICD-10-CM

## 2012-09-10 MED ORDER — OLMESARTAN MEDOXOMIL-HCTZ 20-12.5 MG PO TABS: 1.0000 | ORAL_TABLET | Freq: Every day | ORAL | Status: DC

## 2012-09-10 NOTE — Telephone Encounter (Signed)
Patient is aware that the sample of Benicar 20/12/5 mg are waiting for her to pick up. CLS

## 2012-10-24 ENCOUNTER — Other Ambulatory Visit: Payer: Self-pay | Admitting: Family Medicine

## 2012-10-25 ENCOUNTER — Telehealth: Payer: Self-pay | Admitting: Medical

## 2012-10-25 DIAGNOSIS — I10 Essential (primary) hypertension: Secondary | ICD-10-CM

## 2012-10-25 MED ORDER — OLMESARTAN MEDOXOMIL-HCTZ 20-12.5 MG PO TABS: 1.0000 | ORAL_TABLET | Freq: Every day | ORAL | Status: DC

## 2012-10-25 NOTE — Telephone Encounter (Signed)
Done and pt notified. 

## 2012-12-03 ENCOUNTER — Other Ambulatory Visit: Payer: Self-pay | Admitting: Family Medicine

## 2012-12-13 ENCOUNTER — Ambulatory Visit (INDEPENDENT_AMBULATORY_CARE_PROVIDER_SITE_OTHER): Payer: No Typology Code available for payment source | Admitting: Family Medicine

## 2012-12-13 ENCOUNTER — Encounter: Payer: Self-pay | Admitting: Family Medicine

## 2012-12-13 VITALS — BP 122/82 | HR 64 | Ht 68.0 in | Wt 252.0 lb

## 2012-12-13 DIAGNOSIS — E669 Obesity, unspecified: Secondary | ICD-10-CM

## 2012-12-13 DIAGNOSIS — I10 Essential (primary) hypertension: Secondary | ICD-10-CM

## 2012-12-13 DIAGNOSIS — D869 Sarcoidosis, unspecified: Secondary | ICD-10-CM

## 2012-12-13 DIAGNOSIS — Z79899 Other long term (current) drug therapy: Secondary | ICD-10-CM

## 2012-12-13 DIAGNOSIS — Z0000 Encounter for general adult medical examination without abnormal findings: Secondary | ICD-10-CM

## 2012-12-13 DIAGNOSIS — R7301 Impaired fasting glucose: Secondary | ICD-10-CM

## 2012-12-13 DIAGNOSIS — E7800 Pure hypercholesterolemia, unspecified: Secondary | ICD-10-CM

## 2012-12-13 DIAGNOSIS — E039 Hypothyroidism, unspecified: Secondary | ICD-10-CM

## 2012-12-13 LAB — CBC WITH DIFFERENTIAL/PLATELET
Basophils Absolute: 0 10*3/uL (ref 0.0–0.1)
Eosinophils Relative: 3 % (ref 0–5)
HCT: 40.8 % (ref 36.0–46.0)
Lymphocytes Relative: 38 % (ref 12–46)
Lymphs Abs: 2.4 10*3/uL (ref 0.7–4.0)
MCV: 86.6 fL (ref 78.0–100.0)
Neutro Abs: 3.3 10*3/uL (ref 1.7–7.7)
Platelets: 244 10*3/uL (ref 150–400)
RBC: 4.71 MIL/uL (ref 3.87–5.11)
RDW: 14.7 % (ref 11.5–15.5)
WBC: 6.3 10*3/uL (ref 4.0–10.5)

## 2012-12-13 LAB — LIPID PANEL
LDL Cholesterol: 71 mg/dL (ref 0–99)
Total CHOL/HDL Ratio: 3 Ratio

## 2012-12-13 LAB — COMPREHENSIVE METABOLIC PANEL
ALT: 19 U/L (ref 0–35)
AST: 18 U/L (ref 0–37)
Albumin: 4.4 g/dL (ref 3.5–5.2)
CO2: 27 mEq/L (ref 19–32)
Calcium: 9.2 mg/dL (ref 8.4–10.5)
Chloride: 107 mEq/L (ref 96–112)
Creat: 0.79 mg/dL (ref 0.50–1.10)
Potassium: 4.1 mEq/L (ref 3.5–5.3)
Total Protein: 6.8 g/dL (ref 6.0–8.3)

## 2012-12-13 LAB — POCT URINALYSIS DIPSTICK
Blood, UA: NEGATIVE
Glucose, UA: NEGATIVE
Ketones, UA: NEGATIVE
Leukocytes, UA: NEGATIVE
Protein, UA: NEGATIVE
Spec Grav, UA: 1.02
pH, UA: 5

## 2012-12-13 LAB — POCT GLYCOSYLATED HEMOGLOBIN (HGB A1C): Hemoglobin A1C: 5.6

## 2012-12-13 MED ORDER — PRAVASTATIN SODIUM 40 MG PO TABS: ORAL_TABLET | ORAL | Status: DC

## 2012-12-13 MED ORDER — OLMESARTAN MEDOXOMIL-HCTZ 20-12.5 MG PO TABS: 1.0000 | ORAL_TABLET | Freq: Every day | ORAL | Status: DC

## 2012-12-13 MED ORDER — LEVOTHYROXINE SODIUM 75 MCG PO TABS: ORAL_TABLET | ORAL | Status: DC

## 2012-12-13 MED ORDER — FLUTICASONE PROPIONATE 50 MCG/ACT NA SUSP: 2.0000 | Freq: Every day | NASAL | Status: DC

## 2012-12-13 NOTE — Progress Notes (Signed)
Subjective:    Patient ID: Lisa [name], female    DOB: October 22, 1960, 52 y.o.   MRN: [id number]  HPI She is here for a complete examination. She continues to have difficulty with back pain and presently is on a pain management regimen. Those medications are renewed by her pain management physician. She does complain of right lateral foot pain when she stands for long periods of time. She uses Mobic or Advil for this. She does have underlying sarcoid and sees Dr. Sherene Sires but has not seen him in quite some time.she rarely uses her albuterol. She does have reflux symptoms and uses Pepcid. She she continues on her thyroid medication. She has been taking metformin intermittently. She does not check her blood sugars. She is up-to-date on her immunizations, has had a Pap and mammogram as well as DEXA scan. She admits to not exercising regularly. She apparently has a history of RLS but has not been taking her Requip in quite some time and states that she is sleeping fairly well. She also rarely uses Ambien.Social and family history were reviewed.   Review of Systems Negative except as above    Objective:   Physical Exam BP 122/82  Pulse 64  Ht 5\' 8"  (1.727 m)  Wt 252 lb (114.306 kg)  BMI 38.33 kg/m2  General Appearance:    Alert, cooperative, no distress, appears stated age  Head:    Normocephalic, without obvious abnormality, atraumatic  Eyes:    PERRL, conjunctiva/corneas clear, EOM's intact, fundi    benign  Ears:    Normal TM's and external ear canals  Nose:   Nares normal, mucosa normal, no drainage or sinus   tenderness  Throat:   Lips, mucosa, and tongue normal; teeth and gums normal  Neck:   Supple, no lymphadenopathy;  thyroid:  no   enlargement/tenderness/nodules; no carotid   bruit or JVD  Back:    Spine nontender, no curvature, ROM normal, no CVA     tenderness  Lungs:     Clear to auscultation bilaterally without wheezes, rales or     ronchi; respirations unlabored  Chest Wall:     No tenderness or deformity   Heart:    Regular rate and rhythm, S1 and S2 normal, no murmur, rub   or gallop  Breast Exam:    Deferred to GYN  Abdomen:     Soft, non-tender, nondistended, normoactive bowel sounds,    no masses, no hepatosplenomegaly  Genitalia:    Deferred to GYN     Extremities:   No clubbing, cyanosis or edema  Pulses:   2+ and symmetric all extremities  Skin:   Skin color, texture, turgor normal, no rashes or lesions  Lymph nodes:   Cervical, supraclavicular, and axillary nodes normal  Neurologic:   CNII-XII intact, normal strength, sensation and gait; reflexes 2+ and symmetric throughout          Psych:   Normal mood, affect, hygiene and grooming.    Hemoglobin A1c 5.6       Assessment & Plan:  Routine general medical examination at a health care facility - Plan: HM COLONOSCOPY, Lipid panel, CBC with Differential, Comprehensive metabolic panel  HYPERTENSION - Plan: POCT Urinalysis Dipstick  Impaired fasting glucose - Plan: POCT glycosylated hemoglobin (Hb A1C)  Obesity, unspecified - Plan: Amb ref to Medical Nutrition Therapy-MNT  Sarcoidosis  HYPOTHYROIDISM - Plan: levothyroxine (SYNTHROID, LEVOTHROID) 75 MCG tablet  Unspecified essential hypertension - Plan: olmesartan-hydrochlorothiazide (BENICAR HCT) 20-12.5 MG per tablet  Encounter for long-term (current) use of other medications  Pure hypercholesterolemia recommend she stop taking the metformin since her A1c looks good.I did however talk to her about diet and exercise modification to help with weight reduction. She will followup with Dr. Sherene Sires as well as her pain management physician. Continue on thyroid medicine, hypertension medicine as well as the as needed use of albuterol.

## 2012-12-14 NOTE — Progress Notes (Signed)
Quick Note:  CALLED PT CELL # TALKED TO PT SHE VERBALIZED UNDERSTANDING AND SAID SHE MAY NEED TO BE REFERRED TO DIFFERENT GI DR THEY TOLD HER THEY HAVE NEVER SEEN HER INS. BEFORE BUT THEY ARE LOOKING IN TO IT AND I TOLD HER IF IT NEEDED TO BE CHANGED TO GIVE ME A CALL ______

## 2012-12-14 NOTE — Progress Notes (Signed)
Quick Note:  The blood work is normal ______ 

## 2013-01-04 ENCOUNTER — Telehealth: Payer: Self-pay | Admitting: Family Medicine

## 2013-01-04 NOTE — Telephone Encounter (Signed)
CALLED TO INFORM PT WE HAVE NO 20/12.5 BENICAR SHE SAID SHE WAS COMING TO PICK UP HER GRANDMOTHERS TOMORROW AND I TOLD HER IF THERE ANY UP FRONT THEN THAT IS ALL THERE IS

## 2013-01-05 ENCOUNTER — Telehealth: Payer: Self-pay | Admitting: Internal Medicine

## 2013-01-05 NOTE — Telephone Encounter (Signed)
No samples available at this time. Pt aware. 

## 2013-03-18 ENCOUNTER — Other Ambulatory Visit: Payer: Self-pay

## 2013-03-18 DIAGNOSIS — I10 Essential (primary) hypertension: Secondary | ICD-10-CM

## 2013-03-18 MED ORDER — OLMESARTAN MEDOXOMIL-HCTZ 20-12.5 MG PO TABS: 1.0000 | ORAL_TABLET | Freq: Every day | ORAL | Status: DC

## 2013-03-18 NOTE — Telephone Encounter (Signed)
SENT BENICAR IN

## 2013-07-01 ENCOUNTER — Telehealth: Payer: Self-pay | Admitting: Internal Medicine

## 2013-07-01 NOTE — Telephone Encounter (Signed)
Pt has not seen MW since 08/2012. She scheduled OV with him and will get flu shot then. Nothing further needed

## 2013-07-11 ENCOUNTER — Other Ambulatory Visit: Payer: Self-pay

## 2013-07-11 DIAGNOSIS — Z1231 Encounter for screening mammogram for malignant neoplasm of breast: Secondary | ICD-10-CM

## 2013-07-15 ENCOUNTER — Ambulatory Visit: Payer: No Typology Code available for payment source | Admitting: Internal Medicine

## 2013-07-19 ENCOUNTER — Ambulatory Visit (INDEPENDENT_AMBULATORY_CARE_PROVIDER_SITE_OTHER): Payer: No Typology Code available for payment source | Admitting: Internal Medicine

## 2013-07-19 ENCOUNTER — Ambulatory Visit (INDEPENDENT_AMBULATORY_CARE_PROVIDER_SITE_OTHER)
Admission: RE | Admit: 2013-07-19 | Discharge: 2013-07-19 | Disposition: A | Discharge status: Home / Self Care | Payer: No Typology Code available for payment source | Source: Ambulatory Visit | Attending: Internal Medicine | Admitting: Internal Medicine

## 2013-07-19 ENCOUNTER — Encounter: Payer: Self-pay | Admitting: Internal Medicine

## 2013-07-19 VITALS — BP 130/80 | HR 60 | Temp 97.1°F | Ht 67.0 in | Wt 244.0 lb

## 2013-07-19 DIAGNOSIS — D869 Sarcoidosis, unspecified: Secondary | ICD-10-CM

## 2013-07-19 DIAGNOSIS — R059 Cough, unspecified: Secondary | ICD-10-CM

## 2013-07-19 DIAGNOSIS — R05 Cough: Secondary | ICD-10-CM

## 2013-07-19 DIAGNOSIS — Z23 Encounter for immunization: Secondary | ICD-10-CM

## 2013-07-19 DIAGNOSIS — R058 Other specified cough: Secondary | ICD-10-CM | POA: Insufficient documentation

## 2013-07-19 MED ORDER — PREGABALIN 75 MG PO CAPS: 75.0000 mg | ORAL_CAPSULE | Freq: Three times a day (TID) | ORAL | Status: DC

## 2013-07-19 MED ORDER — FAMOTIDINE 20 MG PO TABS: ORAL_TABLET | ORAL | Status: DC

## 2013-07-19 NOTE — Progress Notes (Signed)
Quick Note:  Advised pt of cxr per Dr. Sherene Sires. Pt verbalized understanding and has no further questions at this time ______

## 2013-07-19 NOTE — Progress Notes (Signed)
Subjective:    Patient ID: Lisa [name], female    DOB: Oct 26, 1960    MRN: [id number]  HPI 35 yobf quit smoking 03/2005 with chronic sarcoidosis manifested mostly arthritic complaints (started maybe in retrospect in 2004) prednisone -dependent from January 2008 until 08/2012.   March 27, 2010 6 wk followup. Pt c/o cough x 3 wks. She states that it only happens at night when lies down. She states that cough is dry. She states that she has been postponing her pm dose of the symbicort 80 for when the cough occurs and this seems to help. no sob. arthritis ok on 5 mg every other day dosing. no ocular c/o or rash. rec try taper to q 3 d dosing and increase symbicort to 160 2bid   May 08, 2010 ov 6 wk followup. Pt states that her cough is better. Still has some cough in the am- prod with thick yellow sputum. She states had some itchy rash on her left leg a wk ago but this has resolved. actually on 2.5 mg q3d with no complaints on the days she misses pred dosing. >>Prednisone stopped.   July 04, 2010--Presents for est med calendar - pt brought all meds with her today. no new complaints. Last visit weaned off steroids. No flare since last vist. We reviewed her meds and updated her med calendar. . Got her flu shot yesterday. no change rx   October 04, 2010 ov no chang cc doe but hacky dry cough worse, feels like throat is irritated ? from symbicort esp in am. Change the way you use symbicort and only 12 hours if breathing difficulty or wheezing  Add pepcid 20 mg one at bedtime  Try chlortrimeton 4 mg one at bedtime   December 04, 2010 ov all better, rare need for symbicort, all smiles, no cough or sob. >>no changes   02/14/2011 follow up and med review Pt returns for follow up and med review. She is doing well since last visit . No flare in cough or dyspena.  Rare use of ProAir.  Uses Symbicort As needed  . Rare use. No significant drainage or tickle in throat. Has  Stopped clortrimeton. Has  zyrtec but not using it.   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 rec No change rx, follow the calendar provided    07/25/2011 f/u ov/Lisa [name] cc  Pt states no resp c/o's. She has had itchy  rash underneath arm x 6 wks, improving now. Did not bring med calendar - arthritis controlled with otcs.  No ocular co's no cough no sob rec Follow calendar  01/27/2012 f/u ov/Lisa [name] cc  No show   03/12/2012 Follow up  Doing well with no increased cough or dyspnea.  Does have arthritic symptoms in hips and back/legs, controlled on lyrica .  Insurance running out at end of May and is trying to get new insurance.  Last xray was 07/2011 with no acute changes.  >no changes   06/10/2012 Acute OV  Complains of persist cough for last 4 weeks. Was  Seen in ER -prednisone taper . She does not have insurance.  Complains of  hoarseness, prod cough with white and some yellow mucus x3 weeks. No hemoptysis or edema.  No fever .  >>doxycycline /steroids   07/02/2012 Acute OV  Still having prod cough with white/yellow mucus, increased SOB, hoarseness, tightness in chest, body aches since last ov .  Cough Improved but did not totally resolve.  Has sinus pain  and pressure with ear fullness.  Drainage in throat rec Avelox 400mg  daily -take with food-samples given .  Delsym 2 tsp every 12 hr As needed  Cough .  Phenergan w/ Codeine 1-2 tsp every 4-6 hr As needed  Cough  Prednisone 20mg  daily until seen back in office   07/23/2012 f/u ov/Lisa [name] did not bring calendar as requested cc refractory dry cough dating back to July 2013 not responding to prednisone at 20 mg and symbicort 160 2 bid, no purulent sputum, no rash or arthralgias, no better p avelox. No sob unless coughing. >>symbicort stopped and pred held at 10mg    08/06/2012 Follow up and med review / NP Patient returns for a two-week followup for her sarcoid and cough. We reviewed all her medication and organized them into a medication calendar with  patient education. It appears that she has some confusion w/ As needed  meds and cough control regimen.   At last visit. Patient was recommended to discontinue her Symbicort, and decrease her prednisone to 10 mg daily. She was also placed on a cough suppression regimen with Delsym and Tylenol. #3 She does feel some better however, continues to have some intermittent cough, however, has not been taking her Delsym on a consistent basis as previously recommended rec Follow med calendar closely and bring to each visit.  Continue on current prednisone at 10mg  daily  Try to get ahead of the cough with Delsym and Tylenol #3  08/27/2012 f/u ov/Lisa [name] cc cough no change, ran out of prednisone x 3 days, not using delsym because it makes her sleepy but takes zyrtec in am instead of hs as per med calendar, which she says she's following.  Cough is honking, dry,  rec First take delsym two tsp every 12 hours and supplement if needed with tylenol #3 up to 2 every 4 hours to suppress the urge to cough.  GERD  If not better in 2 weeks return with all medications in two separate bags, your maintenance (plan A) vs your as neededs (plan B) Prednisone 10 mg take  4 each am x 2 days,   2 each am x 2 days,  1 each am x2days and stop    07/19/2013 f/u ov/Lisa [name] re: sarcoid f/u , not steroids since 08/2012 Chief Complaint  Patient presents with  . Follow-up    Pt states having joint pain-esp in the am x 3-4 months. Her cough has improved some, but still has a 'tickle in her throat"  dry  cough maybe twice a week, typically in evening or at hs. Has not tried nsaids for arthritis "all over" much worse off lyrica   No obvious day to day or daytime variabilty or assoc sob  or cp or chest tightness, subjective wheeze overt sinus or hb symptoms. No unusual exp hx or h/o childhood pna/ asthma or knowledge of premature birth.  Sleeping ok without nocturnal  or early am exacerbation  of respiratory  c/o's or need for noct  saba. Also denies any obvious fluctuation of symptoms with weather or environmental changes or other aggravating or alleviating factors except as outlined above   Current Medications, Allergies, Complete Past Medical History, Past Surgical History, Family History, and Social History were reviewed in [employer] record.  ROS  The following are not active complaints unless bolded sore throat, dysphagia, dental problems, itching, sneezing,  nasal congestion or excess/ purulent secretions, ear ache,   fever, chills, sweats, unintended wt loss, pleuritic or exertional cp,  hemoptysis,  orthopnea pnd or leg swelling, presyncope, palpitations, heartburn, abdominal pain, anorexia, nausea, vomiting, diarrhea  or change in bowel or urinary habits, change in stools or urine, dysuria,hematuria,  rash, arthralgias, visual complaints, headache, numbness weakness or ataxia or problems with walking or coordination,  change in mood/affect or memory.                 Objective:   Physical Exam  Wt   253 07/25/2011 >  07/23/2012  252 >253 08/06/2012 > 08/27/2012  250> 07/19/2013  244   GEN: A/Ox3; pleasant , NAD, well nourished , obese  HEENT:  [place]/AT,  EACs-clear, TMs nl NOSE-clear drainage , sinus pain/pressure THROAT-clear, no lesions, no postnasal drip or exudate noted.   NECK:  Supple w/ fair ROM; no JVD; normal carotid impulses w/o bruits; no thyromegaly or nodules palpated; no lymphadenopathy.  RESP  Clear bilateral  BS w/ no wheezing.    CARD:  RRR, no m/r/g  , no peripheral edema, pulses intact, no cyanosis or clubbing.  GI:   Soft & nt; nml bowel sounds; no organomegaly or masses detected.  Musco: Warm bil, no deformities or joint swelling noted.   Neuro: alert, no focal deficits noted.    Skin: Warm, no lesions or rashes      CXR  07/19/2013 :  No active cardiopulmonary disease.     Assessment & Plan:

## 2013-07-19 NOTE — Patient Instructions (Addendum)
Please remember to go to the x-ray department downstairs for your tests - we will call you with the results when they are available.  Add chlortrimeton 4 mg at bedtime (over the counter) to see if the cough/tickle improves  Add back lyrica as before   If not better to your satisfaction > See Tammy NP first with  with all your medications, even over the counter meds, separated in two separate bags, the ones you take no matter what vs the ones you stop once you feel better and take only as needed when you feel you need them.   Tammy  will generate for you a new user friendly medication calendar that will put [place] all on the same page re: your medication use.     Without this process, it simply isn't possible to assure that we are providing  your outpatient care  with  the attention to detail we feel you deserve.   If we cannot assure that you're getting that kind of care,  then we cannot manage your problem effectively from this clinic.  Once you have seen Tammy and we are sure that we're all on the same page with your medication use she will arrange follow up with me.

## 2013-07-19 NOTE — Assessment & Plan Note (Signed)
-   off chronic prednisone as of 08/2012   Not clear there is any active sarcoid at all at this point. Her joint complaints only flared after she stopped lyrica, not after she stopped prednisone around 10 months.  For now leave off prednisone and try restarting prednisone

## 2013-07-19 NOTE — Assessment & Plan Note (Signed)
Most c/w  Classic Upper airway cough syndrome, so named because it's frequently impossible to sort out how much is  CR/sinusitis with freq throat clearing (which can be related to primary GERD)   vs  causing  secondary (" extra esophageal")  GERD from wide swings in gastric pressure that occur with throat clearing, often  promoting self use of mint and menthol lozenges that reduce the lower esophageal sphincter tone and exacerbate the problem further in a cyclical fashion.   These are the same pts (now being labeled as having "irritable larynx syndrome" by some cough centers) who not infrequently have a history of having failed to tolerate ace inhibitors,  dry powder inhalers or biphosphonates or report having atypical reflux symptoms that don't respond to standard doses of PPI , and are easily confused as having aecopd or asthma flares by even experienced allergists/ pulmonologists.   Try adding 1st gen H1  To H2 hs to see if noct cough resolves.  If not, 1st step is to return for full medication reconciliation.

## 2013-07-26 ENCOUNTER — Ambulatory Visit
Admission: RE | Admit: 2013-07-26 | Discharge: 2013-07-26 | Disposition: A | Discharge status: Home / Self Care | Payer: BC Managed Care – PPO | Source: Ambulatory Visit

## 2013-07-26 DIAGNOSIS — Z1231 Encounter for screening mammogram for malignant neoplasm of breast: Secondary | ICD-10-CM

## 2013-07-27 ENCOUNTER — Telehealth: Payer: Self-pay | Admitting: Internal Medicine

## 2013-07-27 NOTE — Telephone Encounter (Signed)
I called [facility] and spoke with the pharmacists. I was advised they did have RX on file for pt. I called and made pt aware. Nothing further needed

## 2013-07-28 ENCOUNTER — Ambulatory Visit (INDEPENDENT_AMBULATORY_CARE_PROVIDER_SITE_OTHER): Payer: BC Managed Care – PPO | Admitting: Family Medicine

## 2013-07-28 VITALS — BP 128/74 | HR 70 | Wt 238.0 lb

## 2013-07-28 DIAGNOSIS — R42 Dizziness and giddiness: Secondary | ICD-10-CM

## 2013-07-28 MED ORDER — MECLIZINE HCL 12.5 MG PO TABS: 12.5000 mg | ORAL_TABLET | Freq: Three times a day (TID) | ORAL | Status: DC | PRN

## 2013-07-28 NOTE — Patient Instructions (Signed)
Take the Antivert as needed for the dizziness and Tylenol as needed for aches and pains. The Antivert can cause drowsiness so you can take it and work

## 2013-07-28 NOTE — Progress Notes (Signed)
  Subjective:    Patient ID: Lisa [name], female    DOB: Jan 07, 1961, 52 y.o.   MRN: [id number]  HPI Last night she had some slight chills and malaise and went to bed. This morning she got up and noted dizziness and one episode of double vision but no blurred vision, weakness, sore throat, cough, congestion, fever or chills or heart rate issues.   Review of Systems     Objective:   Physical Exam alert and in no distress. Tympanic membranes and canals are normal. Throat is clear. Tonsils are normal. Neck is supple without adenopathy or thyromegaly. Cardiac exam shows a regular sinus rhythm without murmurs or gallops. Lungs are clear to auscultation.        Assessment & Plan:  Dizziness - Plan: meclizine (ANTIVERT) 12.5 MG tablet  I explained that this could easily be viral but her only symptom  is dizziness. I will treat this and recommend supportive care and use of Tylenol for aches and pains.

## 2013-09-05 ENCOUNTER — Other Ambulatory Visit: Payer: Self-pay | Admitting: Internal Medicine

## 2013-09-05 MED ORDER — FAMOTIDINE 20 MG PO TABS: ORAL_TABLET | ORAL | Status: DC

## 2013-09-10 ENCOUNTER — Other Ambulatory Visit: Payer: Self-pay | Admitting: Family Medicine

## 2013-12-15 ENCOUNTER — Encounter: Payer: BC Managed Care – PPO | Admitting: Family Medicine

## 2013-12-21 ENCOUNTER — Telehealth: Payer: Self-pay | Admitting: Internal Medicine

## 2013-12-21 DIAGNOSIS — E039 Hypothyroidism, unspecified: Secondary | ICD-10-CM

## 2013-12-21 MED ORDER — LEVOTHYROXINE SODIUM 75 MCG PO TABS: ORAL_TABLET | ORAL | Status: DC

## 2013-12-21 NOTE — Telephone Encounter (Signed)
Medication sent in. 

## 2013-12-21 NOTE — Telephone Encounter (Signed)
Refill request for levothroxine775mcg to [facility] [place]

## 2013-12-30 ENCOUNTER — Encounter: Payer: Self-pay | Admitting: Family Medicine

## 2013-12-30 ENCOUNTER — Ambulatory Visit (INDEPENDENT_AMBULATORY_CARE_PROVIDER_SITE_OTHER): Payer: BC Managed Care – PPO | Admitting: Family Medicine

## 2013-12-30 VITALS — BP 140/70 | HR 60 | Ht 67.0 in | Wt 242.0 lb

## 2013-12-30 DIAGNOSIS — Z0000 Encounter for general adult medical examination without abnormal findings: Secondary | ICD-10-CM

## 2013-12-30 DIAGNOSIS — E785 Hyperlipidemia, unspecified: Secondary | ICD-10-CM

## 2013-12-30 DIAGNOSIS — R5383 Other fatigue: Secondary | ICD-10-CM

## 2013-12-30 DIAGNOSIS — E039 Hypothyroidism, unspecified: Secondary | ICD-10-CM

## 2013-12-30 DIAGNOSIS — J45909 Unspecified asthma, uncomplicated: Secondary | ICD-10-CM

## 2013-12-30 DIAGNOSIS — M1990 Unspecified osteoarthritis, unspecified site: Secondary | ICD-10-CM

## 2013-12-30 DIAGNOSIS — K219 Gastro-esophageal reflux disease without esophagitis: Secondary | ICD-10-CM

## 2013-12-30 DIAGNOSIS — R5381 Other malaise: Secondary | ICD-10-CM

## 2013-12-30 DIAGNOSIS — I10 Essential (primary) hypertension: Secondary | ICD-10-CM

## 2013-12-30 DIAGNOSIS — R7301 Impaired fasting glucose: Secondary | ICD-10-CM

## 2013-12-30 DIAGNOSIS — D869 Sarcoidosis, unspecified: Secondary | ICD-10-CM

## 2013-12-30 DIAGNOSIS — M129 Arthropathy, unspecified: Secondary | ICD-10-CM

## 2013-12-30 LAB — CBC WITH DIFFERENTIAL/PLATELET
BASOS PCT: 0 % (ref 0–1)
Basophils Absolute: 0 10*3/uL (ref 0.0–0.1)
EOS PCT: 1 % (ref 0–5)
Eosinophils Absolute: 0.1 10*3/uL (ref 0.0–0.7)
HCT: 40.1 % (ref 36.0–46.0)
Hemoglobin: 13.6 g/dL (ref 12.0–15.0)
Lymphocytes Relative: 35 % (ref 12–46)
Lymphs Abs: 2.5 10*3/uL (ref 0.7–4.0)
MCH: 29.1 pg (ref 26.0–34.0)
MCHC: 33.9 g/dL (ref 30.0–36.0)
MCV: 85.7 fL (ref 78.0–100.0)
Monocytes Absolute: 0.4 10*3/uL (ref 0.1–1.0)
Monocytes Relative: 6 % (ref 3–12)
NEUTROS PCT: 58 % (ref 43–77)
Neutro Abs: 4.1 10*3/uL (ref 1.7–7.7)
PLATELETS: 268 10*3/uL (ref 150–400)
RBC: 4.68 MIL/uL (ref 3.87–5.11)
RDW: 14.6 % (ref 11.5–15.5)
WBC: 7 10*3/uL (ref 4.0–10.5)

## 2013-12-30 LAB — COMPREHENSIVE METABOLIC PANEL
ALK PHOS: 94 U/L (ref 39–117)
ALT: 22 U/L (ref 0–35)
AST: 23 U/L (ref 0–37)
Albumin: 4.3 g/dL (ref 3.5–5.2)
BILIRUBIN TOTAL: 0.5 mg/dL (ref 0.2–1.2)
BUN: 11 mg/dL (ref 6–23)
CO2: 26 mEq/L (ref 19–32)
CREATININE: 0.7 mg/dL (ref 0.50–1.10)
Calcium: 9.2 mg/dL (ref 8.4–10.5)
Chloride: 104 mEq/L (ref 96–112)
GLUCOSE: 78 mg/dL (ref 70–99)
Potassium: 3.8 mEq/L (ref 3.5–5.3)
SODIUM: 140 meq/L (ref 135–145)
TOTAL PROTEIN: 6.9 g/dL (ref 6.0–8.3)

## 2013-12-30 LAB — POCT URINALYSIS DIPSTICK
Bilirubin, UA: NEGATIVE
Glucose, UA: NEGATIVE
Ketones, UA: NEGATIVE
Leukocytes, UA: NEGATIVE
NITRITE UA: NEGATIVE
RBC UA: NEGATIVE
Spec Grav, UA: 1.01
UROBILINOGEN UA: NEGATIVE
pH, UA: 7

## 2013-12-30 LAB — LIPID PANEL
Cholesterol: 179 mg/dL (ref 0–200)
HDL: 45 mg/dL (ref >39)
LDL Cholesterol: 118 mg/dL — ABNORMAL HIGH (ref 0–99)
TRIGLYCERIDES: 81 mg/dL (ref <150)
Total CHOL/HDL Ratio: 4 Ratio
VLDL: 16 mg/dL (ref 0–40)

## 2013-12-30 MED ORDER — LEVOTHYROXINE SODIUM 75 MCG PO TABS: ORAL_TABLET | ORAL | Status: DC

## 2013-12-30 MED ORDER — DICLOFENAC SODIUM 1 % TD GEL: 2.0000 g | Freq: Four times a day (QID) | TRANSDERMAL | Status: DC

## 2013-12-30 MED ORDER — PRAVASTATIN SODIUM 40 MG PO TABS: ORAL_TABLET | ORAL | Status: DC

## 2013-12-30 MED ORDER — OLMESARTAN MEDOXOMIL-HCTZ 20-12.5 MG PO TABS
1.0000 | ORAL_TABLET | Freq: Every day | ORAL | Status: DC
Start: 2013-12-30 — End: 2014-11-19

## 2013-12-30 MED ORDER — PANTOPRAZOLE SODIUM 40 MG PO TBEC: DELAYED_RELEASE_TABLET | ORAL | Status: DC

## 2013-12-31 ENCOUNTER — Encounter: Payer: Self-pay | Admitting: Family Medicine

## 2013-12-31 LAB — TSH: TSH: 2.773 u[IU]/mL (ref 0.350–4.500)

## 2013-12-31 NOTE — Progress Notes (Signed)
Subjective:    Patient ID: Lisa [name], female    DOB: [birth year], 53 y.o.   MRN: [id number]  HPI She is here for complete examination. She now has insurance and is back on her medications. She does have a history of low back pain and has had difficulty with hip pain. She would like to try Voltaren topical. She broke some from a relative and thinks this might be helpful. She continues on Protonix for her reflux symptoms. She also has a underlying hypothyroidism Synthroid. She has an underlying history of sarcoidosis and is being followed for this. She takes Pravachol for her hyperlipidemia. She is on Benicar for her hypertension. She does complain of intermittent fatigue however recently she has restarted her thyroid medication. Social and family history were reviewed.   Review of Systems  All other systems reviewed and are negative.       Objective:   Physical Exam BP 140/70  Pulse 60  Ht 5\' 7"  (1.702 m)  Wt 242 lb (109.77 kg)  BMI 37.89 kg/m2  General Appearance:    Alert, cooperative, no distress, appears stated age  Head:    Normocephalic, without obvious abnormality, atraumatic  Eyes:    PERRL, conjunctiva/corneas clear, EOM's intact, fundi    benign  Ears:    Normal TM's and external ear canals  Nose:   Nares normal, mucosa normal, no drainage or sinus   tenderness  Throat:   Lips, mucosa, and tongue normal; teeth and gums normal  Neck:   Supple, no lymphadenopathy;  thyroid:  no   enlargement/tenderness/nodules; no caroid   bruit or JVD  Back:    Spine nontender, no curvature, ROM normal, no CVA     tenderness  Lungs:     Clear to auscultation bilaterally without wheezes, rales or     ronchi; respirations unlabored  Chest Wall:    No tenderness or deformity   Heart:    Regular rate and rhythm, S1 and S2 normal, no murmur, rub   or gallop  Breast Exam:    Deferred to GYN  Abdomen:     Soft, non-tender, nondistended, normoactive bowel sounds,    no masses, no  hepatosplenomegaly  Genitalia:    Deferred to GYN     Extremities:   No clubbing, cyanosis or edema  Pulses:   2+ and symmetric all extremities  Skin:   Skin color, texture, turgor normal, no rashes or lesions  Lymph nodes:   Cervical, supraclavicular, and axillary nodes normal  Neurologic:   CNII-XII intact, normal strength, sensation and gait; reflexes 2+ and symmetric throughout          Psych:   Normal mood, affect, hygiene and grooming.          Assessment & Plan:  Routine general medical examination at a health care facility - Plan: Urinalysis Dipstick, CBC with Differential, Comprehensive metabolic panel, Lipid panel, TSH  Arthritis - Plan: diclofenac sodium (VOLTAREN) 1 % GEL  Fatigue  GERD - Plan: pantoprazole (PROTONIX) 40 MG tablet  ASTHMA  HYPOTHYROIDISM - Plan: levothyroxine (SYNTHROID, LEVOTHROID) 75 MCG tablet, TSH  HYPERTENSION  Impaired fasting glucose - Plan: Comprehensive metabolic panel  Sarcoidosis  Unspecified essential hypertension - Plan: olmesartan-hydrochlorothiazide (BENICAR HCT) 20-12.5 MG per tablet  Hyperlipidemia LDL goal < 100 - Plan: pravastatin (PRAVACHOL) 40 MG tablet, Lipid panel tHer medications were renewed. I will have her try Voltaire and at her request. She will continue on her other medications. Diet and exercise were  not discussed with her at this time due to time constraints.

## 2014-06-20 ENCOUNTER — Other Ambulatory Visit: Payer: Self-pay

## 2014-06-20 DIAGNOSIS — Z1231 Encounter for screening mammogram for malignant neoplasm of breast: Secondary | ICD-10-CM

## 2014-06-27 ENCOUNTER — Telehealth: Payer: Self-pay | Admitting: Internal Medicine

## 2014-06-27 NOTE — Telephone Encounter (Signed)
Ok to schedule ov for both (or can get though primary but def needs ov for [place] to give them)

## 2014-06-27 NOTE — Telephone Encounter (Signed)
Pt last OV 07/19/13 Pt last had PNA vaccine 03/12/12 (not prevnar) Please advise MW thanks

## 2014-06-27 NOTE — Telephone Encounter (Signed)
Called pt. appt scheduled to see MW Friday. She will get vaccines at that time. Nothing further needed

## 2014-06-30 ENCOUNTER — Encounter (INDEPENDENT_AMBULATORY_CARE_PROVIDER_SITE_OTHER): Payer: Self-pay

## 2014-06-30 ENCOUNTER — Ambulatory Visit (INDEPENDENT_AMBULATORY_CARE_PROVIDER_SITE_OTHER): Payer: BC Managed Care – PPO | Admitting: Internal Medicine

## 2014-06-30 ENCOUNTER — Encounter: Payer: Self-pay | Admitting: Internal Medicine

## 2014-06-30 VITALS — BP 132/80 | HR 68 | Temp 98.7°F | Ht 67.0 in | Wt 237.0 lb

## 2014-06-30 DIAGNOSIS — R058 Other specified cough: Secondary | ICD-10-CM

## 2014-06-30 DIAGNOSIS — D869 Sarcoidosis, unspecified: Secondary | ICD-10-CM

## 2014-06-30 DIAGNOSIS — R05 Cough: Secondary | ICD-10-CM

## 2014-06-30 DIAGNOSIS — R059 Cough, unspecified: Secondary | ICD-10-CM

## 2014-06-30 NOTE — Patient Instructions (Addendum)
Only use zyrtec as needed for itching sneezing and runny nose  Flu shot today > all further primary care per Dr Susann Givens

## 2014-06-30 NOTE — Assessment & Plan Note (Signed)
-   off chronic prednisone as of 08/2012    Does not need any regular f/u for this now 2 y after last dose of pred s flare off it

## 2014-06-30 NOTE — Progress Notes (Signed)
Subjective:    Patient ID: Lisa [name], female    DOB: September 01, 1961    MRN: [id number]   Brief patient profile:  17 yobf quit smoking 03/2005 with chronic sarcoidosis manifested mostly arthritic complaints (started maybe in retrospect in 2004) prednisone -dependent from January 2008 until 08/2012.    History of Present Illness   March 27, 2010 6 wk followup. Pt c/o cough x 3 wks. She states that it only happens at night when lies down. She states that cough is dry. She states that she has been postponing her pm dose of the symbicort 80 for when the cough occurs and this seems to help. no sob. arthritis ok on 5 mg every other day dosing. no ocular c/o or rash. rec try taper to q 3 d dosing and increase symbicort to 160 2bid   May 08, 2010 ov 6 wk followup. Pt states that her cough is better. Still has some cough in the am- prod with thick yellow sputum. She states had some itchy rash on her left leg a wk ago but this has resolved. actually on 2.5 mg q3d with no complaints on the days she misses pred dosing. >>Prednisone stopped.   July 04, 2010--Presents for est med calendar - pt brought all meds with her today. no new complaints. Last visit weaned off steroids. No flare since last vist. We reviewed her meds and updated her med calendar. . Got her flu shot yesterday. no change rx   October 04, 2010 ov no chang cc doe but hacky dry cough worse, feels like throat is irritated ? from symbicort esp in am. Change the way you use symbicort and only 12 hours if breathing difficulty or wheezing  Add pepcid 20 mg one at bedtime  Try chlortrimeton 4 mg one at bedtime   December 04, 2010 ov all better, rare need for symbicort, all smiles, no cough or sob. >>no changes   02/14/2011 follow up and med review Pt returns for follow up and med review. She is doing well since last visit . No flare in cough or dyspena.  Rare use of ProAir.  Uses Symbicort As needed  . Rare use. No significant drainage or  tickle in throat. Has  Stopped clortrimeton. Has zyrtec but not using it.   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 rec No change rx, follow the calendar provided    07/25/2011 f/u ov/Lisa [name] cc  Pt states no resp c/o's. She has had itchy  rash underneath arm x 6 wks, improving now. Did not bring med calendar - arthritis controlled with otcs.  No ocular co's no cough no sob rec Follow calendar  01/27/2012 f/u ov/Lisa [name] cc  No show   03/12/2012 Follow up  Doing well with no increased cough or dyspnea.  Does have arthritic symptoms in hips and back/legs, controlled on lyrica .  Insurance running out at end of May and is trying to get new insurance.  Last xray was 07/2011 with no acute changes.  >no changes   06/10/2012 Acute OV  Complains of persist cough for last 4 weeks. Was  Seen in ER -prednisone taper . She does not have insurance.  Complains of  hoarseness, prod cough with white and some yellow mucus x3 weeks. No hemoptysis or edema.  No fever .  >>doxycycline /steroids   07/02/2012 Acute OV  Still having prod cough with white/yellow mucus, increased SOB, hoarseness, tightness in chest, body aches since last ov .  Cough Improved but did not totally resolve.  Has sinus pain and pressure with ear fullness.  Drainage in throat rec Avelox  daily -take with food-samples given .  Delsym 2 tsp every 12 hr As needed  Cough .  Phenergan w/ Codeine 1-2 tsp every 4-6 hr As needed  Cough  Prednisone  daily until seen back in office   07/23/2012 f/u ov/Lisa [name] did not bring calendar as requested cc refractory dry cough dating back to July 2013 not responding to prednisone at 20 mg and symbicort 160 2 bid, no purulent sputum, no rash or arthralgias, no better p avelox. No sob unless coughing. >>symbicort stopped and pred held at    08/06/2012 Follow up and med review / NP Patient returns for a two-week followup for her sarcoid and cough. We reviewed all her medication and  organized them into a medication calendar with patient education. It appears that she has some confusion w/ As needed  meds and cough control regimen.   At last visit. Patient was recommended to discontinue her Symbicort, and decrease her prednisone to 10 mg daily. She was also placed on a cough suppression regimen with Delsym and Tylenol. #3 She does feel some better however, continues to have some intermittent cough, however, has not been taking her Delsym on a consistent basis as previously recommended rec Follow med calendar closely and bring to each visit.  Continue on current prednisone at  daily  Try to get ahead of the cough with Delsym and Tylenol #3  08/27/2012 f/u ov/Lisa [name] cc cough no change, ran out of prednisone x 3 days, not using delsym because it makes her sleepy but takes zyrtec in am instead of hs as per med calendar, which she says she's following.  Cough is honking, dry,  rec First take delsym two tsp every 12 hours and supplement if needed with tylenol #3 up to 2 every 4 hours to suppress the urge to cough.  GERD  If not better in 2 weeks return with all medications in two separate bags, your maintenance (plan A) vs your as neededs (plan B) Prednisone 10 mg take  4 each am x 2 days,   2 each am x 2 days,  1 each am x2days and stop    07/19/2013 f/u ov/Lisa [name] re: sarcoid f/u , not steroids since 08/2012 Chief Complaint  Patient presents with  . Follow-up    Pt states having joint pain-esp in the am x 3-4 months. Her cough has improved some, but still has a 'tickle in her throat"  dry  cough maybe twice a week, typically in evening or at hs. Has not tried nsaids for arthritis "all over" much worse off lyrica rec Please remember to go to the x-ray department downstairs for your tests - we will call you with the results when they are available. Add chlortrimeton 4 mg at bedtime (over the counter) to see if the cough/tickle improves Add back lyrica as before  If not better  to your satisfaction > See Tammy NP first with  with all your medications> did not return    06/30/2014 f/u ov/Lisa [name] re: final sarcoid/ cough f/u  No cough on maint rx with  ppi and pepcd and no need saba in over a year  Takes zyrtec daily but doesn't notice any flare in rhinitis symptoms off it     No  sob  or cp or chest tightness, subjective wheeze overt sinus or hb symptoms. No unusual exp hx or  h/o childhood pna/ asthma or knowledge of premature birth.  Sleeping ok without nocturnal  or early am exacerbation  of respiratory  c/o's or need for noct saba. Also denies any obvious fluctuation of symptoms with weather or environmental changes or other aggravating or alleviating factors except as outlined above   Current Medications, Allergies, Complete Past Medical History, Past Surgical History, Family History, and Social History were reviewed in [employer] record.  ROS  The following are not active complaints unless bolded sore throat, dysphagia, dental problems, itching, sneezing,  nasal congestion or excess/ purulent secretions, ear ache,   fever, chills, sweats, unintended wt loss, pleuritic or exertional cp, hemoptysis,  orthopnea pnd or leg swelling, presyncope, palpitations, heartburn, abdominal pain, anorexia, nausea, vomiting, diarrhea  or change in bowel or urinary habits, change in stools or urine, dysuria,hematuria,  rash, arthralgias, visual complaints, headache, numbness weakness or ataxia or problems with walking or coordination,  change in mood/affect or memory.                 Objective:   Physical Exam  Wt   253 07/25/2011 >  07/23/2012  252 >253 08/06/2012 > 08/27/2012  250> 07/19/2013  244 > 06/30/2014  237   GEN: A/Ox3; pleasant , NAD, well nourished , obese  HEENT:  [place]/AT,  EACs-clear, TMs nl NOSE-clear drainage , sinus pain/pressure THROAT-clear, no lesions, no postnasal drip or exudate noted.   NECK:  Supple w/ fair ROM; no JVD; normal  carotid impulses w/o bruits; no thyromegaly or nodules palpated; no lymphadenopathy.  RESP  Clear bilateral  BS w/ no wheezing.    CARD:  RRR, no m/r/g  , no peripheral edema, pulses intact, no cyanosis or clubbing.  GI:   Soft & nt; nml bowel sounds; no organomegaly or masses detected.  Musco: Warm bil, no deformities or joint swelling noted.   Neuro: alert, no focal deficits noted.    Skin: Warm, no lesions or rashes      CXR  07/19/2013 :  No active cardiopulmonary disease.     Assessment & Plan:

## 2014-06-30 NOTE — Assessment & Plan Note (Addendum)
Clearly responding to rx directed at GERD  rx with no flare when misses dose of zyrtec    rec chagne the zyrtec to prn but  continue max gerd  rx and diet  / reinforced    Each maintenance medication was reviewed in detail including most importantly the difference between maintenance and as needed and under what circumstances the prns are to be used.  Please see instructions for details which were reviewed in writing and the patient given a copy.

## 2014-07-27 ENCOUNTER — Ambulatory Visit
Admission: RE | Admit: 2014-07-27 | Discharge: 2014-07-27 | Disposition: A | Discharge status: Home / Self Care | Payer: BC Managed Care – PPO | Source: Ambulatory Visit

## 2014-07-27 DIAGNOSIS — Z1231 Encounter for screening mammogram for malignant neoplasm of breast: Secondary | ICD-10-CM

## 2014-11-02 ENCOUNTER — Other Ambulatory Visit (HOSPITAL_COMMUNITY): Payer: Self-pay

## 2014-11-02 ENCOUNTER — Other Ambulatory Visit (HOSPITAL_COMMUNITY): Payer: Self-pay | Admitting: Orthopaedic Surgery

## 2014-11-03 ENCOUNTER — Encounter (HOSPITAL_COMMUNITY): Payer: Self-pay

## 2014-11-03 ENCOUNTER — Encounter (HOSPITAL_COMMUNITY)
Admission: RE | Admit: 2014-11-03 | Discharge: 2014-11-03 | Disposition: A | Discharge status: Home / Self Care | Payer: BLUE CROSS/BLUE SHIELD | Source: Ambulatory Visit | Attending: Orthopaedic Surgery | Admitting: Orthopaedic Surgery

## 2014-11-03 DIAGNOSIS — Z01812 Encounter for preprocedural laboratory examination: Secondary | ICD-10-CM | POA: Diagnosis present

## 2014-11-03 DIAGNOSIS — M1612 Unilateral primary osteoarthritis, left hip: Secondary | ICD-10-CM | POA: Diagnosis not present

## 2014-11-03 DIAGNOSIS — Z01810 Encounter for preprocedural cardiovascular examination: Secondary | ICD-10-CM | POA: Insufficient documentation

## 2014-11-03 DIAGNOSIS — Z01818 Encounter for other preprocedural examination: Secondary | ICD-10-CM | POA: Insufficient documentation

## 2014-11-03 HISTORY — DX: Pneumonia, unspecified organism: J18.9

## 2014-11-03 LAB — PROTIME-INR
INR: 1.06 (ref 0.00–1.49)
PROTHROMBIN TIME: 13.9 s (ref 11.6–15.2)

## 2014-11-03 LAB — CBC
HCT: 41.1 % (ref 36.0–46.0)
Hemoglobin: 13.5 g/dL (ref 12.0–15.0)
MCH: 28.8 pg (ref 26.0–34.0)
MCHC: 32.8 g/dL (ref 30.0–36.0)
MCV: 87.6 fL (ref 78.0–100.0)
Platelets: 237 10*3/uL (ref 150–400)
RBC: 4.69 MIL/uL (ref 3.87–5.11)
RDW: 14 % (ref 11.5–15.5)
WBC: 6.4 10*3/uL (ref 4.0–10.5)

## 2014-11-03 LAB — URINALYSIS, ROUTINE W REFLEX MICROSCOPIC
BILIRUBIN URINE: NEGATIVE
Glucose, UA: NEGATIVE mg/dL
Hgb urine dipstick: NEGATIVE
Ketones, ur: NEGATIVE mg/dL
Leukocytes, UA: NEGATIVE
Nitrite: NEGATIVE
PH: 6.5 (ref 5.0–8.0)
Protein, ur: 30 mg/dL — AB
Specific Gravity, Urine: 1.021 (ref 1.005–1.030)
Urobilinogen, UA: 1 mg/dL (ref 0.0–1.0)

## 2014-11-03 LAB — BASIC METABOLIC PANEL
Anion gap: 8 (ref 5–15)
BUN: 16 mg/dL (ref 6–23)
CALCIUM: 9.4 mg/dL (ref 8.4–10.5)
CO2: 25 mmol/L (ref 19–32)
Chloride: 108 mEq/L (ref 96–112)
Creatinine, Ser: 0.95 mg/dL (ref 0.50–1.10)
GFR calc non Af Amer: 67 mL/min — ABNORMAL LOW (ref >90)
GFR, EST AFRICAN AMERICAN: 77 mL/min — AB (ref >90)
GLUCOSE: 87 mg/dL (ref 70–99)
Potassium: 3.5 mmol/L (ref 3.5–5.1)
SODIUM: 141 mmol/L (ref 135–145)

## 2014-11-03 LAB — TYPE AND SCREEN
ABO/RH(D): B POS
Antibody Screen: NEGATIVE

## 2014-11-03 LAB — URINE MICROSCOPIC-ADD ON

## 2014-11-03 LAB — ABO/RH: ABO/RH(D): B POS

## 2014-11-03 LAB — SURGICAL PCR SCREEN
MRSA, PCR: NEGATIVE
Staphylococcus aureus: NEGATIVE

## 2014-11-03 LAB — APTT: aPTT: 32 seconds (ref 24–37)

## 2014-11-03 NOTE — Pre-Procedure Instructions (Signed)
Lisa [name]  11/03/2014   Your procedure is scheduled on:  Tuesday November 14, 2014 at 1230 Pm  Report to [facility] Admitting at 1030 AM.  Call this number if you have problems the morning of surgery: [phone]   Remember:   Do not eat food or drink liquids after midnight Monday 11/13/14.   Take these medicines the morning of surgery with A SIP OF WATER: Protonix, and bring inhaler day of surgery Stop Aspirin, multivitamins, Nsaids and herbal meds 5 days prior to surgery   Do not wear jewelry, make-up or nail polish.  Do not wear lotions, powders, or perfumes. You may wear deodorant.  Do not shave 48 hours prior to surgery.   Do not bring valuables to the hospital.  [facility] is not responsible                  for any belongings or valuables.               Contacts, dentures or bridgework may not be worn into surgery.  Leave suitcase in the car. After surgery it may be brought to your room.  For patients admitted to the hospital, discharge time is determined by your                treatment team.               Patients discharged the day of surgery will not be allowed to drive  home.    Special Instructions: [place] - Preparing for Surgery  Before surgery, you can play an important role.  Because skin is not sterile, your skin needs to be as free of germs as possible.  You can reduce the number of germs on you skin by washing with CHG (chlorahexidine gluconate) soap before surgery.  CHG is an antiseptic cleaner which kills germs and bonds with the skin to continue killing germs even after washing.  Please DO NOT use if you have an allergy to CHG or antibacterial soaps.  If your skin becomes reddened/irritated stop using the CHG and inform your nurse when you arrive at Short Stay.  Do not shave (including legs and underarms) for at least 48 hours prior to the first CHG shower.  You may shave your face.  Please follow these instructions carefully:   1.   Shower with CHG Soap the night before surgery and the                                morning of Surgery.  2.  If you choose to wash your hair, wash your hair first as usual with your       normal shampoo.  3.  After you shampoo, rinse your hair and body thoroughly to remove the                      Shampoo.  4.  Use CHG as you would any other liquid soap.  You can apply chg directly       to the skin and wash gently with scrungie or a clean washcloth.  5.  Apply the CHG Soap to your body ONLY FROM THE NECK DOWN.        Do not use on open wounds or open sores.  Avoid contact with your eyes,       ears, mouth and genitals (private parts).  Wash genitals (private parts)       with your normal soap.  6.  Wash thoroughly, paying special attention to the area where your surgery        will be performed.  7.  Thoroughly rinse your body with warm water from the neck down.  8.  DO NOT shower/wash with your normal soap after using and rinsing off       the CHG Soap.  9.  Pat yourself dry with a clean towel.            10.  Wear clean pajamas.            11.  Place clean sheets on your bed the night of your first shower and do not        sleep with pets.  Day of Surgery  Do not apply any lotions/deoderants the morning of surgery.  Please wear clean clothes to the hospital/surgery center.      Please read over the following fact sheets that you were given: Pain Booklet, Coughing and Deep Breathing, Blood Transfusion Information, MRSA Information and Surgical Site Infection Prevention

## 2014-11-06 NOTE — Progress Notes (Signed)
Anesthesia Chart Review:  Pt is 54 year old female scheduled for L total hip arthroplasty on 11/14/2014 with Dr. Magnus IvanBlackman.   PMH includes: HTN, hyperlipidemia, impaired fasting glucose, hypothyroidism, sarcoidosis, anemia. Former smoker. BMI 35.6.   Preoperative labs reviewed.    EKG: Sinus bradycardia (50 bpm). Minimal voltage criteria for LVH, may be normal variant. Nonspecific T wave abnormality.   If no changes, I anticipate pt can proceed with surgery as scheduled.   Rica Mastngela Dao Mearns, FNP-BC [facility]Anest[facility]iology Phone: [phone] 11/06/2014 3:31 PM

## 2014-11-13 MED ORDER — CEFAZOLIN SODIUM-DEXTROSE 2-3 GM-% IV SOLR
2.0000 g | INTRAVENOUS | Status: AC
  Administered 2014-11-14: 2 g via INTRAVENOUS
  Filled 2014-11-13: qty 50

## 2014-11-14 ENCOUNTER — Encounter (HOSPITAL_COMMUNITY): Payer: Self-pay | Admitting: *Deleted

## 2014-11-14 ENCOUNTER — Inpatient Hospital Stay (HOSPITAL_COMMUNITY)
Admission: RE | Admit: 2014-11-14 | Discharge: 2014-11-16 | DRG: 470 | Disposition: A | Discharge status: Home Health | Payer: BLUE CROSS/BLUE SHIELD | Source: Ambulatory Visit | Attending: Orthopaedic Surgery | Admitting: Orthopaedic Surgery

## 2014-11-14 ENCOUNTER — Inpatient Hospital Stay (HOSPITAL_COMMUNITY): Payer: BLUE CROSS/BLUE SHIELD | Admitting: Emergency Medicine

## 2014-11-14 ENCOUNTER — Inpatient Hospital Stay (HOSPITAL_COMMUNITY): Payer: BLUE CROSS/BLUE SHIELD

## 2014-11-14 ENCOUNTER — Inpatient Hospital Stay (HOSPITAL_COMMUNITY): Payer: BLUE CROSS/BLUE SHIELD | Admitting: Anesthesiology

## 2014-11-14 ENCOUNTER — Encounter (HOSPITAL_COMMUNITY)
Admission: RE | Disposition: A | Discharge status: Home Health | Payer: Self-pay | Source: Ambulatory Visit | Attending: Orthopaedic Surgery

## 2014-11-14 DIAGNOSIS — E039 Hypothyroidism, unspecified: Secondary | ICD-10-CM | POA: Diagnosis present

## 2014-11-14 DIAGNOSIS — G2581 Restless legs syndrome: Secondary | ICD-10-CM | POA: Diagnosis present

## 2014-11-14 DIAGNOSIS — K219 Gastro-esophageal reflux disease without esophagitis: Secondary | ICD-10-CM | POA: Diagnosis present

## 2014-11-14 DIAGNOSIS — D8689 Sarcoidosis of other sites: Secondary | ICD-10-CM | POA: Diagnosis present

## 2014-11-14 DIAGNOSIS — M25552 Pain in left hip: Secondary | ICD-10-CM | POA: Diagnosis present

## 2014-11-14 DIAGNOSIS — M1612 Unilateral primary osteoarthritis, left hip: Principal | ICD-10-CM

## 2014-11-14 DIAGNOSIS — I10 Essential (primary) hypertension: Secondary | ICD-10-CM | POA: Diagnosis present

## 2014-11-14 DIAGNOSIS — Z79899 Other long term (current) drug therapy: Secondary | ICD-10-CM

## 2014-11-14 DIAGNOSIS — E785 Hyperlipidemia, unspecified: Secondary | ICD-10-CM | POA: Diagnosis present

## 2014-11-14 DIAGNOSIS — Z96642 Presence of left artificial hip joint: Secondary | ICD-10-CM

## 2014-11-14 DIAGNOSIS — D860 Sarcoidosis of lung: Secondary | ICD-10-CM | POA: Diagnosis present

## 2014-11-14 DIAGNOSIS — Z7982 Long term (current) use of aspirin: Secondary | ICD-10-CM | POA: Diagnosis not present

## 2014-11-14 HISTORY — PX: TOTAL HIP ARTHROPLASTY: SHX124

## 2014-11-14 SURGERY — ARTHROPLASTY, HIP, TOTAL, ANTERIOR APPROACH
Anesthesia: General | Laterality: Left

## 2014-11-14 MED ORDER — NEOSTIGMINE METHYLSULFATE 10 MG/10ML IV SOLN
INTRAVENOUS | Status: DC | PRN
  Administered 2014-11-14: 3 mg via INTRAVENOUS

## 2014-11-14 MED ORDER — FENTANYL CITRATE 0.05 MG/ML IJ SOLN
INTRAMUSCULAR | Status: AC
  Filled 2014-11-14: qty 5

## 2014-11-14 MED ORDER — ALBUTEROL SULFATE (2.5 MG/3ML) 0.083% IN NEBU: 2.0000 mL | INHALATION_SOLUTION | RESPIRATORY_TRACT | Status: DC | PRN

## 2014-11-14 MED ORDER — LIDOCAINE HCL (CARDIAC) 20 MG/ML IV SOLN
INTRAVENOUS | Status: DC | PRN
  Administered 2014-11-14: 100 mg via INTRATRACHEAL
  Administered 2014-11-14: 100 mg via INTRAVENOUS

## 2014-11-14 MED ORDER — DEXAMETHASONE SODIUM PHOSPHATE 4 MG/ML IJ SOLN
INTRAMUSCULAR | Status: AC
  Filled 2014-11-14: qty 1

## 2014-11-14 MED ORDER — MIDAZOLAM HCL 5 MG/5ML IJ SOLN
INTRAMUSCULAR | Status: DC | PRN
  Administered 2014-11-14: 2 mg via INTRAVENOUS

## 2014-11-14 MED ORDER — DIPHENHYDRAMINE HCL 12.5 MG/5ML PO ELIX: 12.5000 mg | ORAL_SOLUTION | ORAL | Status: DC | PRN

## 2014-11-14 MED ORDER — MENTHOL 3 MG MT LOZG: 1.0000 | LOZENGE | OROMUCOSAL | Status: DC | PRN

## 2014-11-14 MED ORDER — ADULT MULTIVITAMIN W/MINERALS CH
1.0000 | ORAL_TABLET | Freq: Every day | ORAL | Status: DC
Start: 2014-11-14 — End: 2014-11-16
  Administered 2014-11-15 – 2014-11-16 (×2): 1 via ORAL
  Filled 2014-11-14 (×3): qty 1

## 2014-11-14 MED ORDER — METOCLOPRAMIDE HCL 5 MG/ML IJ SOLN: 5.0000 mg | Freq: Three times a day (TID) | INTRAMUSCULAR | Status: DC | PRN

## 2014-11-14 MED ORDER — SODIUM CHLORIDE 0.9 % IV SOLN
1000.0000 mg | INTRAVENOUS | Status: AC
  Administered 2014-11-14: 1000 mg via INTRAVENOUS
  Filled 2014-11-14: qty 10

## 2014-11-14 MED ORDER — ROPINIROLE HCL 0.5 MG PO TABS
0.5000 mg | ORAL_TABLET | Freq: Every evening | ORAL | Status: DC | PRN
  Filled 2014-11-14: qty 1

## 2014-11-14 MED ORDER — ALBUMIN HUMAN 5 % IV SOLN
INTRAVENOUS | Status: DC | PRN
  Administered 2014-11-14: 13:00:00 via INTRAVENOUS

## 2014-11-14 MED ORDER — ALUM & MAG HYDROXIDE-SIMETH 200-200-20 MG/5ML PO SUSP
30.0000 mL | ORAL | Status: DC | PRN
  Administered 2014-11-16: 30 mL via ORAL
  Filled 2014-11-14: qty 30

## 2014-11-14 MED ORDER — ONDANSETRON HCL 4 MG/2ML IJ SOLN
INTRAMUSCULAR | Status: DC | PRN
Start: 2014-11-14 — End: 2014-11-14
  Administered 2014-11-14: 4 mg via INTRAVENOUS

## 2014-11-14 MED ORDER — OXYCODONE HCL 5 MG PO TABS
5.0000 mg | ORAL_TABLET | ORAL | Status: DC | PRN
  Administered 2014-11-14: 5 mg via ORAL
  Administered 2014-11-14 – 2014-11-16 (×9): 10 mg via ORAL
  Filled 2014-11-14 (×10): qty 2

## 2014-11-14 MED ORDER — DOCUSATE SODIUM 100 MG PO CAPS
100.0000 mg | ORAL_CAPSULE | Freq: Two times a day (BID) | ORAL | Status: DC
  Administered 2014-11-14 – 2014-11-16 (×4): 100 mg via ORAL
  Filled 2014-11-14 (×5): qty 1

## 2014-11-14 MED ORDER — LACTATED RINGERS IV SOLN
INTRAVENOUS | Status: DC
  Administered 2014-11-14 (×2): via INTRAVENOUS

## 2014-11-14 MED ORDER — MIDAZOLAM HCL 2 MG/2ML IJ SOLN
INTRAMUSCULAR | Status: AC
  Filled 2014-11-14: qty 2

## 2014-11-14 MED ORDER — DEXAMETHASONE SODIUM PHOSPHATE 4 MG/ML IJ SOLN
INTRAMUSCULAR | Status: DC | PRN
  Administered 2014-11-14: 4 mg via INTRAVENOUS

## 2014-11-14 MED ORDER — VITAMIN B-12 1000 MCG PO TABS
2500.0000 ug | ORAL_TABLET | Freq: Every day | ORAL | Status: DC
  Administered 2014-11-15 – 2014-11-16 (×2): 2500 ug via ORAL
  Filled 2014-11-14 (×3): qty 1

## 2014-11-14 MED ORDER — SODIUM CHLORIDE 0.9 % IV SOLN
INTRAVENOUS | Status: DC
  Administered 2014-11-14 – 2014-11-15 (×2): via INTRAVENOUS

## 2014-11-14 MED ORDER — ZOLPIDEM TARTRATE 5 MG PO TABS: 5.0000 mg | ORAL_TABLET | Freq: Every evening | ORAL | Status: DC | PRN

## 2014-11-14 MED ORDER — METHOCARBAMOL 500 MG PO TABS
500.0000 mg | ORAL_TABLET | Freq: Four times a day (QID) | ORAL | Status: DC | PRN
Start: 2014-11-14 — End: 2014-11-16
  Administered 2014-11-14 – 2014-11-16 (×4): 500 mg via ORAL
  Filled 2014-11-14 (×6): qty 1

## 2014-11-14 MED ORDER — ACETAMINOPHEN 650 MG RE SUPP: 650.0000 mg | Freq: Four times a day (QID) | RECTAL | Status: DC | PRN

## 2014-11-14 MED ORDER — KETOROLAC TROMETHAMINE 15 MG/ML IJ SOLN
7.5000 mg | Freq: Four times a day (QID) | INTRAMUSCULAR | Status: AC | PRN
  Administered 2014-11-14: 7.5 mg via INTRAVENOUS
  Filled 2014-11-14 (×2): qty 1

## 2014-11-14 MED ORDER — OXYCODONE HCL 5 MG PO TABS
ORAL_TABLET | ORAL | Status: AC
  Filled 2014-11-14: qty 1

## 2014-11-14 MED ORDER — ARTIFICIAL TEARS OP OINT
TOPICAL_OINTMENT | OPHTHALMIC | Status: DC | PRN
  Administered 2014-11-14: 1 via OPHTHALMIC

## 2014-11-14 MED ORDER — ONDANSETRON HCL 4 MG/2ML IJ SOLN: 4.0000 mg | Freq: Once | INTRAMUSCULAR | Status: DC | PRN

## 2014-11-14 MED ORDER — ONDANSETRON HCL 4 MG PO TABS: 4.0000 mg | ORAL_TABLET | Freq: Four times a day (QID) | ORAL | Status: DC | PRN

## 2014-11-14 MED ORDER — ASPIRIN EC 325 MG PO TBEC
325.0000 mg | DELAYED_RELEASE_TABLET | Freq: Two times a day (BID) | ORAL | Status: DC
  Administered 2014-11-14 – 2014-11-16 (×4): 325 mg via ORAL
  Filled 2014-11-14 (×6): qty 1

## 2014-11-14 MED ORDER — ACETAMINOPHEN 325 MG PO TABS
650.0000 mg | ORAL_TABLET | Freq: Four times a day (QID) | ORAL | Status: DC | PRN
  Administered 2014-11-15 (×2): 650 mg via ORAL
  Filled 2014-11-14 (×2): qty 2

## 2014-11-14 MED ORDER — CYCLOSPORINE 0.05 % OP EMUL
1.0000 [drp] | Freq: Two times a day (BID) | OPHTHALMIC | Status: DC
  Administered 2014-11-14 – 2014-11-16 (×4): 1 [drp] via OPHTHALMIC
  Filled 2014-11-14 (×5): qty 1

## 2014-11-14 MED ORDER — FENTANYL CITRATE 0.05 MG/ML IJ SOLN
INTRAMUSCULAR | Status: DC | PRN
  Administered 2014-11-14 (×6): 50 ug via INTRAVENOUS

## 2014-11-14 MED ORDER — CENTRUM SILVER PO TABS: ORAL_TABLET | Freq: Every day | ORAL | Status: DC

## 2014-11-14 MED ORDER — PROPOFOL 10 MG/ML IV BOLUS
INTRAVENOUS | Status: DC | PRN
  Administered 2014-11-14: 150 mg via INTRAVENOUS

## 2014-11-14 MED ORDER — HYDROMORPHONE HCL 1 MG/ML IJ SOLN
INTRAMUSCULAR | Status: AC
  Filled 2014-11-14: qty 1

## 2014-11-14 MED ORDER — METHOCARBAMOL 500 MG PO TABS
ORAL_TABLET | ORAL | Status: AC
  Filled 2014-11-14: qty 1

## 2014-11-14 MED ORDER — METHOCARBAMOL 1000 MG/10ML IJ SOLN
500.0000 mg | Freq: Four times a day (QID) | INTRAVENOUS | Status: DC | PRN
  Filled 2014-11-14: qty 5

## 2014-11-14 MED ORDER — HYDROMORPHONE HCL 1 MG/ML IJ SOLN
1.0000 mg | INTRAMUSCULAR | Status: DC | PRN
  Filled 2014-11-14: qty 1

## 2014-11-14 MED ORDER — CEFAZOLIN SODIUM 1-5 GM-% IV SOLN
1.0000 g | Freq: Four times a day (QID) | INTRAVENOUS | Status: AC
  Administered 2014-11-14 (×2): 1 g via INTRAVENOUS
  Filled 2014-11-14 (×2): qty 50

## 2014-11-14 MED ORDER — EPHEDRINE SULFATE 50 MG/ML IJ SOLN
INTRAMUSCULAR | Status: DC | PRN
  Administered 2014-11-14 (×3): 10 mg via INTRAVENOUS
  Administered 2014-11-14: 5 mg via INTRAVENOUS

## 2014-11-14 MED ORDER — KETOROLAC TROMETHAMINE 15 MG/ML IJ SOLN
INTRAMUSCULAR | Status: AC
  Filled 2014-11-14: qty 1

## 2014-11-14 MED ORDER — POLYETHYLENE GLYCOL 3350 17 G PO PACK: 17.0000 g | PACK | Freq: Every day | ORAL | Status: DC | PRN

## 2014-11-14 MED ORDER — 0.9 % SODIUM CHLORIDE (POUR BTL) OPTIME
TOPICAL | Status: DC | PRN
  Administered 2014-11-14: 1000 mL

## 2014-11-14 MED ORDER — PANTOPRAZOLE SODIUM 40 MG PO TBEC
40.0000 mg | DELAYED_RELEASE_TABLET | Freq: Every day | ORAL | Status: DC
  Administered 2014-11-14 – 2014-11-16 (×3): 40 mg via ORAL
  Filled 2014-11-14 (×4): qty 1

## 2014-11-14 MED ORDER — GLYCOPYRROLATE 0.2 MG/ML IJ SOLN
INTRAMUSCULAR | Status: DC | PRN
  Administered 2014-11-14: 0.4 mg via INTRAVENOUS

## 2014-11-14 MED ORDER — PHENOL 1.4 % MT LIQD: 1.0000 | OROMUCOSAL | Status: DC | PRN

## 2014-11-14 MED ORDER — ROCURONIUM BROMIDE 100 MG/10ML IV SOLN
INTRAVENOUS | Status: DC | PRN
  Administered 2014-11-14: 40 mg via INTRAVENOUS

## 2014-11-14 MED ORDER — ONDANSETRON HCL 4 MG/2ML IJ SOLN: 4.0000 mg | Freq: Four times a day (QID) | INTRAMUSCULAR | Status: DC | PRN

## 2014-11-14 MED ORDER — METOCLOPRAMIDE HCL 10 MG PO TABS: 5.0000 mg | ORAL_TABLET | Freq: Three times a day (TID) | ORAL | Status: DC | PRN

## 2014-11-14 MED ORDER — VITAMIN B-12 2500 MCG SL SUBL: 1.0000 | SUBLINGUAL_TABLET | Freq: Every day | SUBLINGUAL | Status: DC

## 2014-11-14 MED ORDER — HYDROMORPHONE HCL 1 MG/ML IJ SOLN
0.2500 mg | INTRAMUSCULAR | Status: DC | PRN
  Administered 2014-11-14 (×2): 0.5 mg via INTRAVENOUS

## 2014-11-14 SURGICAL SUPPLY — 58 items
BENZOIN TINCTURE PRP APPL 2/3 (GAUZE/BANDAGES/DRESSINGS) ×2 IMPLANT
BLADE SAW SGTL 18X1.27X75 (BLADE) ×2 IMPLANT
BLADE SURG ROTATE 9660 (MISCELLANEOUS) IMPLANT
BNDG GAUZE ELAST 4 BULKY (GAUZE/BANDAGES/DRESSINGS) IMPLANT
CAPT HIP TOTAL 2 ×2 IMPLANT
CELLS DAT CNTRL 66122 CELL SVR (MISCELLANEOUS) ×1 IMPLANT
CLSR STERI-STRIP ANTIMIC 1/2X4 (GAUZE/BANDAGES/DRESSINGS) ×2 IMPLANT
COVER SURGICAL LIGHT HANDLE (MISCELLANEOUS) ×2 IMPLANT
DRAPE C-ARM 42X72 X-RAY (DRAPES) ×2 IMPLANT
DRAPE IMP U-DRAPE 54X76 (DRAPES) ×2 IMPLANT
DRAPE STERI IOBAN 125X83 (DRAPES) ×2 IMPLANT
DRAPE U-SHAPE 47X51 STRL (DRAPES) ×6 IMPLANT
DRSG AQUACEL AG ADV 3.5X10 (GAUZE/BANDAGES/DRESSINGS) ×2 IMPLANT
DURAPREP 26ML APPLICATOR (WOUND CARE) ×2 IMPLANT
ELECT BLADE 4.0 EZ CLEAN MEGAD (MISCELLANEOUS)
ELECT BLADE 6.5 EXT (BLADE) ×2 IMPLANT
ELECT CAUTERY BLADE 6.4 (BLADE) ×2 IMPLANT
ELECT REM PT RETURN 9FT ADLT (ELECTROSURGICAL) ×2
ELECTRODE BLDE 4.0 EZ CLN MEGD (MISCELLANEOUS) IMPLANT
ELECTRODE REM PT RTRN 9FT ADLT (ELECTROSURGICAL) ×1 IMPLANT
FACESHIELD WRAPAROUND (MASK) ×6 IMPLANT
GLOVE BIOGEL PI IND STRL 6.5 (GLOVE) ×4 IMPLANT
GLOVE BIOGEL PI IND STRL 7.0 (GLOVE) ×4 IMPLANT
GLOVE BIOGEL PI IND STRL 8 (GLOVE) ×3 IMPLANT
GLOVE BIOGEL PI INDICATOR 6.5 (GLOVE) ×4
GLOVE BIOGEL PI INDICATOR 7.0 (GLOVE) ×4
GLOVE BIOGEL PI INDICATOR 8 (GLOVE) ×3
GLOVE ECLIPSE 8.0 STRL XLNG CF (GLOVE) ×4 IMPLANT
GLOVE ORTHO TXT STRL SZ7.5 (GLOVE) ×4 IMPLANT
GOWN STRL REUS W/ TWL LRG LVL3 (GOWN DISPOSABLE) ×4 IMPLANT
GOWN STRL REUS W/ TWL XL LVL3 (GOWN DISPOSABLE) ×3 IMPLANT
GOWN STRL REUS W/TWL LRG LVL3 (GOWN DISPOSABLE) ×4
GOWN STRL REUS W/TWL XL LVL3 (GOWN DISPOSABLE) ×3
HANDPIECE INTERPULSE COAX TIP (DISPOSABLE)
KIT BASIN OR (CUSTOM PROCEDURE TRAY) ×2 IMPLANT
KIT ROOM TURNOVER OR (KITS) ×2 IMPLANT
MANIFOLD NEPTUNE II (INSTRUMENTS) ×2 IMPLANT
NS IRRIG 1000ML POUR BTL (IV SOLUTION) ×2 IMPLANT
PACK TOTAL JOINT (CUSTOM PROCEDURE TRAY) ×2 IMPLANT
PACK UNIVERSAL I (CUSTOM PROCEDURE TRAY) ×2 IMPLANT
PAD ARMBOARD 7.5X6 YLW CONV (MISCELLANEOUS) ×4 IMPLANT
RTRCTR WOUND ALEXIS 18CM MED (MISCELLANEOUS) ×2
SET HNDPC FAN SPRY TIP SCT (DISPOSABLE) IMPLANT
SPONGE LAP 18X18 X RAY DECT (DISPOSABLE) IMPLANT
SPONGE LAP 4X18 X RAY DECT (DISPOSABLE) IMPLANT
STRIP CLOSURE SKIN 1/2X4 (GAUZE/BANDAGES/DRESSINGS) ×4 IMPLANT
SUT ETHIBOND NAB CT1 #1 30IN (SUTURE) ×2 IMPLANT
SUT MNCRL AB 4-0 PS2 18 (SUTURE) ×2 IMPLANT
SUT VIC AB 0 CT1 27 (SUTURE) ×1
SUT VIC AB 0 CT1 27XBRD ANBCTR (SUTURE) ×1 IMPLANT
SUT VIC AB 1 CT1 27 (SUTURE) ×1
SUT VIC AB 1 CT1 27XBRD ANBCTR (SUTURE) ×1 IMPLANT
SUT VIC AB 2-0 CT1 27 (SUTURE) ×1
SUT VIC AB 2-0 CT1 TAPERPNT 27 (SUTURE) ×1 IMPLANT
TOWEL OR 17X24 6PK STRL BLUE (TOWEL DISPOSABLE) ×2 IMPLANT
TOWEL OR 17X26 10 PK STRL BLUE (TOWEL DISPOSABLE) ×4 IMPLANT
TRAY FOLEY CATH 16FRSI W/METER (SET/KITS/TRAYS/PACK) ×2 IMPLANT
WATER STERILE IRR 1000ML POUR (IV SOLUTION) IMPLANT

## 2014-11-14 NOTE — Anesthesia Postprocedure Evaluation (Signed)
  Anesthesia Post-op Note  Patient: Lisa [name]  Procedure(s) Performed: Procedure(s): LEFT TOTAL HIP ARTHROPLASTY ANTERIOR APPROACH (Left)  Patient Location: PACU  Anesthesia Type:General  Level of Consciousness: awake, oriented, sedated and patient cooperative  Airway and Oxygen Therapy: Patient Spontanous Breathing  Post-op Pain: mild  Post-op Assessment: Post-op Vital signs reviewed, Patient's Cardiovascular Status Stable, Respiratory Function Stable, Patent Airway, No signs of Nausea or vomiting and Pain level controlled  Post-op Vital Signs: stable  Last Vitals:  Filed Vitals:   11/14/14 1515  BP:   Pulse: 59  Temp:   Resp: 22    Complications: No apparent anesthesia complications

## 2014-11-14 NOTE — H&P (Signed)
TOTAL HIP ADMISSION H&P  Patient is admitted for left total hip arthroplasty.  Subjective:  Chief Complaint: left hip pain  HPI: Lisa [name], 54 y.o. female, has a history of pain and functional disability in the left hip(s) due to arthritis and patient has failed non-surgical conservative treatments for greater than 12 weeks to include NSAID's and/or analgesics, flexibility and strengthening excercises, weight reduction as appropriate and activity modification.  Onset of symptoms was gradual starting 3 years ago with gradually worsening course since that time.The patient noted no past surgery on the left hip(s).  Patient currently rates pain in the left hip at 10 out of 10 with activity. Patient has night pain, worsening of pain with activity and weight bearing, pain that interfers with activities of daily living and pain with passive range of motion. Patient has evidence of subchondral cysts, subchondral sclerosis, periarticular osteophytes and joint space narrowing by imaging studies. This condition presents safety issues increasing the risk of falls.  There is no current active infection.  Patient Active Problem List   Diagnosis Date Noted  . Osteoarthritis of left hip 11/14/2014  . Upper airway cough syndrome 07/19/2013  . Impaired fasting glucose 06/04/2011  . Hyperlipidemia LDL goal < 100 06/04/2011  . SHOULDER PAIN 01/25/2009  . ASTHMA 01/16/2009  . OBESITY, UNSPECIFIED 02/03/2008  . Sarcoidosis 09/07/2007  . HYPOTHYROIDISM 09/07/2007  . HYPERTENSION 09/07/2007  . GERD 09/07/2007   Past Medical History  Diagnosis Date  . Essential hypertension, benign   . Obesity, unspecified   . Sarcoidosis     pulmonary and hepatic; followed by Dr. Sherene Sires  . Esophageal reflux   . Unspecified hypothyroidism   . Anemia   . Unspecified vitamin D deficiency     resolved  . Low back pain     Dr. Noel Gerold  . Restless leg syndrome   . Impaired fasting glucose     02/2010--fglu 126, A1c 6.2  .  Hyperlipidemia   . Pneumonia     years ago     Past Surgical History  Procedure Laterality Date  . Abdominal hysterectomy  03/2005    Dr. Earlene Plater    Prescriptions prior to admission  Medication Sig Dispense Refill Last Dose  . albuterol (PROAIR HFA) 108 (90 BASE) MCG/ACT inhaler Inhale 2 puffs into the lungs every 3 (three) hours as needed. (Patient taking differently: Inhale 2 puffs into the lungs every 3 (three) hours as needed for wheezing. ) 1 Inhaler 0 Past Month at Unknown time  . cetirizine (ZYRTEC) 10 MG tablet Take 10 mg by mouth at bedtime as needed for allergies.    Past Week at Unknown time  . Cyanocobalamin (VITAMIN B-12) 2500 MCG SUBL Place 1 tablet under the tongue daily.   Past Month at Unknown time  . cycloSPORINE (RESTASIS) 0.05 % ophthalmic emulsion Place 1 drop into both eyes 2 (two) times daily.     Past Week at Unknown time  . famotidine (PEPCID) 20 MG tablet One at bedtime (Patient taking differently: Take 20 mg by mouth at bedtime. One at bedtime) 30 tablet 11 Past Week at Unknown time  . fluticasone (FLONASE) 50 MCG/ACT nasal spray Place 2 sprays into the nose daily as needed for allergies.    Past Week at Unknown time  . Menthol, Topical Analgesic, (BIOFREEZE EX) Apply 1 application topically 3 (three) times daily as needed (for pain).   11/13/2014 at Unknown time  . Multiple Vitamins-Minerals (CENTRUM SILVER) tablet Take by mouth daily.  Past Week at Unknown time  . pantoprazole (PROTONIX) 40 MG tablet Take 40 mg by mouth daily.   11/14/2014 at 0845  . Polyethyl Glycol-Propyl Glycol (SYSTANE) 0.4-0.3 % SOLN Apply 1 drop to eye daily. Dry eyes   Past Week at Unknown time  . rOPINIRole (REQUIP) 0.5 MG tablet Take 0.5 mg by mouth at bedtime as needed (for restless leg). Take 1 tablet at bedtime as needed   Past Month at Unknown time  . dextromethorphan (DELSYM) 30 MG/5ML liquid 2 tsp every 12 hours as needed for cough   Taking  . diclofenac sodium (VOLTAREN) 1 % GEL Apply  2 g topically 4 (four) times daily. 100 g 11 Taking  . levothyroxine (SYNTHROID, LEVOTHROID) 75 MCG tablet TAKE ONE TABLET BY MOUTH EVERY DAY (Patient not taking: Reported on 11/02/2014) 90 tablet 3 Not Taking at Unknown time  . meclizine (ANTIVERT) 12.5 MG tablet Take 1 tablet (12.5 mg total) by mouth 3 (three) times daily as needed. (Patient not taking: Reported on 11/02/2014) 30 tablet 0 Not Taking at Unknown time  . olmesartan-hydrochlorothiazide (BENICAR HCT) 20-12.5 MG per tablet Take 1 tablet by mouth daily. (Patient not taking: Reported on 11/02/2014) 90 tablet 3 Not Taking at Unknown time  . pantoprazole (PROTONIX) 40 MG tablet TAKE ONE TABLET BY MOUTH EVERY DAY 90 tablet 3 Taking  . pravastatin (PRAVACHOL) 40 MG tablet TAKE ONE TABLET BY MOUTH EVERY DAY (Patient not taking: Reported on 11/02/2014) 90 tablet 3 Not Taking at Unknown time  . zolpidem (AMBIEN) 10 MG tablet Take 1 tablet (10 mg total) by mouth at bedtime as needed. (Patient taking differently: Take 10 mg by mouth at bedtime as needed for sleep. ) 30 tablet 1 Unknown at Unknown time   Allergies  Allergen Reactions  . Bupropion Hcl Hives    History  Substance Use Topics  . Smoking status: Former Smoker -- 0.50 packs/day for 20 years    Types: Cigarettes    Quit date: 10/20/2004  . Smokeless tobacco: Never Used  . Alcohol Use: Yes     Comment: occasional    Family History  Problem Relation Age of Onset  . Cancer Mother     bladder  . Cancer Father [id number]    colon cancer  . Diabetes Maternal Grandfather      Review of Systems  Musculoskeletal: Positive for back pain and joint pain.  All other systems reviewed and are negative.   Objective:  Physical Exam  Constitutional: She is oriented to person, place, and time. She appears well-developed and well-nourished.  HENT:  Head: Normocephalic and atraumatic.  Eyes: EOM are normal. Pupils are equal, round, and reactive to light.  Neck: Neck supple.  Cardiovascular:  Normal rate and regular rhythm.   Respiratory: Effort normal and breath sounds normal.  GI: Soft. Bowel sounds are normal.  Musculoskeletal:       Left hip: She exhibits decreased range of motion, decreased strength and bony tenderness.  Neurological: She is alert and oriented to person, place, and time.  Skin: Skin is warm and dry.  Psychiatric: She has a normal mood and affect.    Vital signs in last 24 hours: Temp:  [98.3 F (36.8 C)] 98.3 F (36.8 C) (01/26 1032) Pulse Rate:  [70] 70 (01/26 1032) Resp:  [20] 20 (01/26 1032) BP: (151)/(85) 151/85 mmHg (01/26 1032) SpO2:  [97 %] 97 % (01/26 1032) Weight:  [106.198 kg (234 lb 2 oz)] 106.198 kg (234 lb 2 oz) (01/26  1019)  Labs:   Estimated body mass index is 35.61 kg/(m^2) as calculated from the following:   Height as of this encounter:  (1.727 m).   Weight as of this encounter: 106.198 kg (234 lb 2 oz).   Imaging Review Plain radiographs demonstrate severe degenerative joint disease of the left hip(s). The bone quality appears to be good for age and reported activity level.  Assessment/Plan:  End stage arthritis, left hip(s)  The patient history, physical examination, clinical judgement of the provider and imaging studies are consistent with end stage degenerative joint disease of the left hip(s) and total hip arthroplasty is deemed medically necessary. The treatment options including medical management, injection therapy, arthroscopy and arthroplasty were discussed at length. The risks and benefits of total hip arthroplasty were presented and reviewed. The risks due to aseptic loosening, infection, stiffness, dislocation/subluxation,  thromboembolic complications and other imponderables were discussed.  The patient acknowledged the explanation, agreed to proceed with the plan and consent was signed. Patient is being admitted for inpatient treatment for surgery, pain control, PT, OT, prophylactic antibiotics, VTE prophylaxis,  progressive ambulation and ADL's and discharge planning.The patient is planning to be discharged home with home health services

## 2014-11-14 NOTE — Anesthesia Procedure Notes (Signed)
Procedure Name: Intubation Date/Time: 11/14/2014 12:29 PM Performed by: Carola Frost Pre-anesthesia Checklist: Patient identified, Emergency Drugs available, Suction available, Patient being monitored and Timeout performed Patient Re-evaluated:Patient Re-evaluated prior to inductionOxygen Delivery Method: Circle system utilized Preoxygenation: Pre-oxygenation with 100% oxygen Intubation Type: IV induction Ventilation: Mask ventilation without difficulty Laryngoscope Size: Mac and 3 Grade View: Grade I Tube type: Oral Tube size: 7.5 mm Number of attempts: 1 Airway Equipment and Method: Stylet and LTA kit utilized Placement Confirmation: ETT inserted through vocal cords under direct vision,  positive ETCO2,  CO2 detector and breath sounds checked- equal and bilateral Secured at: 21 cm Tube secured with: Tape Dental Injury: Teeth and Oropharynx as per pre-operative assessment

## 2014-11-14 NOTE — Op Note (Signed)
NAMEMarland Kitchen  Lisa, ATOR NO.:  [id number]  MEDICAL RECORD NO.:  [id number]  LOCATION:  [id number]                        FACILITY:  [facility]  PHYSICIAN:  Vanita Panda. Magnus Ivan, M.D.DATE OF BIRTH:  June 29, 1961  DATE OF PROCEDURE:  11/14/2014 DATE OF DISCHARGE:                              OPERATIVE REPORT   PREOPERATIVE DIAGNOSIS:  Primary osteoarthritis and degenerative joint disease, left hip.  POSTOPERATIVE DIAGNOSES:  Primary osteoarthritis and degenerative joint disease, left hip.  PROCEDURE:  Left total hip arthroplasty via direct anterior approach.  IMPLANTS:  DePuy Sector Gription acetabular component size 50, size 32+ 4 neutral polyethylene liner, size 11 Corail femoral component with standard offset, size 32+ 1 ceramic hip ball.  SURGEON:  Vanita Panda. Magnus Ivan, M.D.  ASSISTANT SURGEON:  Burnard Bunting, M.D.  ASSISTANT:  Richardean Canal, PA-C  ANESTHESIA:  General.  ANTIBIOTICS:  2 g of IV Ancef.  BLOOD LOSS:  250 mL.  COMPLICATIONS:  None.  INDICATIONS:  Lisa [name] is a 54 year old female with known well- documented severe primary osteoarthritis involving her left hip, which has become very painful to her.  She has x-rays that show complete loss of her left hip joint space, periarticular osteophytes, sclerotic changes as well as cystic changes.  Her internal and external rotation are very limited secondary to pain as well as blocks to rotation.  She has tried every form of conservative treatment and has failed this.  At this point, her quality of life, her activities of daily living, and her mobility are all limited and detrimental to her to the point that she wishes to proceed with a total hip arthroplasty.  She understands the risk of acute blood loss anemia, nerve and vessel injury, fracture, infection, dislocation, and DVT.  She understands the goals are decreased pain, improved mobility, and overall improved quality of life.  PROCEDURE  DESCRIPTION:  After informed consent was obtained, appropriate left hip was marked.  She was brought to the operating room.  General anesthesia was obtained while she was on a stretcher.  Foley catheter was placed and both feet had traction boots applied to them.  Next, she was placed supine on the HANA fracture table with the perineal post in place and both legs in inline skeletal traction devices, but no traction applied.  Her left operative hip was then prepped and draped with DuraPrep and sterile drapes.  Time-out was called and she was identified as correct patient, correct left hip.  We then made an incision inferior and posterior to the anterior superior iliac spine and carried this slightly obliquely down the leg.  I dissected down to the tensor fascia lata muscle.  The tensor fascia was then divided longitudinally so we could proceed with direct anterior approach to the hip.  We identified and cauterized the lateral femoral circumflex vessels and then we were able to place a Cobra retractor around the lateral neck and up underneath the rectus femoris, a Cobra retractor medially.  I then opened up the joint capsule in a L-type format finding a large joint effusion and significant osteophytes and sclerotic changes all around the femoral neck and head.  I then placed the retractors within the  joint capsule.  I then made my femoral neck cut with an oscillating saw just proximal to the lesser trochanter and completed this with an osteotome.  I placed a corkscrew guide in the femoral head and removed the femoral head in its entirety without difficulty.  It was devoid of cartilage.  I then cleaned the acetabular and remnants of acetabular labrum and debris.  I placed a bent Hohmann medially and a Cobra retractor laterally.  I then began reaming in 2 mm increments from a size 42 all the way up to a size 50 with all reamers under direct visualization and last reamer also under direct  fluoroscopy, so we could obtain our depth of the reaming, our inclination, and anteversion.  Once I was pleased with this, I placed the real DePuy Sector Gription acetabular component size 50, the apex hole eliminator and a real 32+ 4 neutral polyethylene liner for this size of acetabular component. Attention was then turned to the femur.  With the leg externally rotated to 100 degrees, extended and adducted, we were able to place Mueller retractor medially and Hohmann retractor behind the greater trochanter. I released the lateral joint capsule and used a box cutting osteotome to enter the femoral canal and a rongeur to lateralize.  I then began broaching from a size 8 broach using Corail broaching system up to a size 11.  With the size 11, I trialed a standard neck and a 32+ 1 hip ball, reduced this back in the acetabulum, and I was pleased with the rotation, stability, offset, and leg lengths under fluoroscopy.  We then dislocated the hip and removed the trial components.  We then placed the real femoral component, which was a Corail size 11 with standard offset and the real 32+ 1 ceramic hip ball, reduced this back in the acetabulum, once again it was stable.  We then copiously irrigated the soft tissues with normal saline solution using pulsatile lavage.  We closed the joint capsule with interrupted #1 Ethibond suture followed by a running #1 Vicryl in the tensor fascia, 0-Vicryl in the deep tissue, 2- 0 Vicryl in the subcutaneous tissue, staples on the skin, and Aquacel dressing was applied.  She was taken off the HANA table, awakened, extubated, and taken to the recovery room in stable condition.  All final counts were correct and there were no complications noted.     Vanita Pandahristopher Y. Magnus IvanBlackman, M.D.     CYB/MEDQ  D:  11/14/2014  T:  11/14/2014  Job:  [id number]

## 2014-11-14 NOTE — Anesthesia Preprocedure Evaluation (Signed)
Anesthesia Evaluation    Airway        Dental   Pulmonary former smoker,          Cardiovascular hypertension,     Neuro/Psych    GI/Hepatic   Endo/Other    Renal/GU      Musculoskeletal   Abdominal   Peds  Hematology   Anesthesia Other Findings   Reproductive/Obstetrics                             Anesthesia Physical Anesthesia Plan  ASA: II  Anesthesia Plan:    Post-op Pain Management:    Induction:   Airway Management Planned:   Additional Equipment:   Intra-op Plan:   Post-operative Plan:   Informed Consent:   Plan Discussed with:   Anesthesia Plan Comments:         Anesthesia Quick Evaluation

## 2014-11-14 NOTE — Brief Op Note (Signed)
11/14/2014  2:14 PM  PATIENT:  Lisa [name]  54 y.o. female  PRE-OPERATIVE DIAGNOSIS:  primary osteoarthritis left hip  POST-OPERATIVE DIAGNOSIS:  primary osteoarthritis left hip  PROCEDURE:  Procedure(s): LEFT TOTAL HIP ARTHROPLASTY ANTERIOR APPROACH (Left)  SURGEON:  Surgeon(s) and Role:    * Kathryne Hitchhristopher Y Elaya Droege, MD - Primary    * Cammy CopaGregory Scott Dean, MD - Assisting  PHYSICIAN ASSISTANT: Rexene EdisonGil Clark, PA-C  ANESTHESIA:   general  EBL:  Total I/O In: 1250 [I.V.:1000; IV Piggyback:250] Out: 450 [Urine:200; Blood:250]  BLOOD ADMINISTERED:none  DRAINS: none   LOCAL MEDICATIONS USED:  NONE  SPECIMEN:  No Specimen  DISPOSITION OF SPECIMEN:  N/A  COUNTS:  YES  TOURNIQUET:  * No tourniquets in log *  DICTATION: .Other Dictation: Dictation Number [id number]  PLAN OF CARE: Admit to inpatient   PATIENT DISPOSITION:  PACU - hemodynamically stable.   Delay start of Pharmacological VTE agent (>24hrs) due to surgical blood loss or risk of bleeding: no

## 2014-11-14 NOTE — Transfer of Care (Signed)
Immediate Anesthesia Transfer of Care Note  Patient: Lisa [name]  Procedure(s) Performed: Procedure(s): LEFT TOTAL HIP ARTHROPLASTY ANTERIOR APPROACH (Left)  Patient Location: PACU  Anesthesia Type:General  Level of Consciousness: awake, alert , oriented and sedated  Airway & Oxygen Therapy: Patient Spontanous Breathing and Patient connected to nasal cannula oxygen  Post-op Assessment: Report given to PACU RN, Post -op Vital signs reviewed and stable and Patient moving all extremities  Post vital signs: Reviewed and stable  Complications: No apparent anesthesia complications

## 2014-11-15 LAB — CBC
HCT: 36.4 % (ref 36.0–46.0)
Hemoglobin: 11.7 g/dL — ABNORMAL LOW (ref 12.0–15.0)
MCH: 28.1 pg (ref 26.0–34.0)
MCHC: 32.1 g/dL (ref 30.0–36.0)
MCV: 87.5 fL (ref 78.0–100.0)
PLATELETS: 221 10*3/uL (ref 150–400)
RBC: 4.16 MIL/uL (ref 3.87–5.11)
RDW: 14.1 % (ref 11.5–15.5)
WBC: 9.5 10*3/uL (ref 4.0–10.5)

## 2014-11-15 LAB — BASIC METABOLIC PANEL
ANION GAP: 9 (ref 5–15)
BUN: 10 mg/dL (ref 6–23)
CHLORIDE: 103 mmol/L (ref 96–112)
CO2: 26 mmol/L (ref 19–32)
Calcium: 8.7 mg/dL (ref 8.4–10.5)
Creatinine, Ser: 0.83 mg/dL (ref 0.50–1.10)
GFR calc non Af Amer: 79 mL/min — ABNORMAL LOW (ref >90)
Glucose, Bld: 114 mg/dL — ABNORMAL HIGH (ref 70–99)
POTASSIUM: 3.9 mmol/L (ref 3.5–5.1)
Sodium: 138 mmol/L (ref 135–145)

## 2014-11-15 NOTE — Plan of Care (Signed)
Problem: Phase I Progression Outcomes Goal: Initial discharge plan identified Outcome: Completed/Met Date Met:  11/15/14 Home health

## 2014-11-15 NOTE — Progress Notes (Signed)
PT Cancellation Note  Patient Details Name: Lisa [name] MRN: [id number] DOB: Dec 09, 1960   Cancelled Treatment:    Reason Eval/Treat Not Completed: Other (comment).  Pt up with OT ambulating in room.  PT to check back after lunch to ambulate pt into the hallway and initiate hip exercises.  Thanks,    Rollene Rotundaebecca B. Lachae Hohler, PT, DPT [phone]   11/15/2014, 11:05 AM

## 2014-11-15 NOTE — Evaluation (Signed)
Physical Therapy Evaluation Patient Details Name: Lisa [name] MRN: [id number] DOB: [birth year] Today's Date: 11/15/2014   History of Present Illness  54 y.o. female [facility]14 for L direct anterior THA.  Pt with significant PMHx of essential HTN, sarcoidosis, anemia, low back pain, and RLS.  Clinical Impression  Pt is POD #1 and is moving well, min assist overall with RW. We were able to make it into the hallway today and initiate hip exercises.  She will likely progress well enough to go home with HHPT and her husband's assist at discharge.   PT to follow acutely for deficits listed below.   Bring step stool from gym to practice getting into and out of high bed simulating her bed at home.     Follow Up Recommendations Home health PT    Equipment Recommendations  Rolling walker with 5" wheels    Recommendations for Other Services   NA    Precautions / Restrictions Precautions Precautions: None Restrictions LLE Weight Bearing: Weight bearing as tolerated      Mobility  Bed Mobility Overal bed mobility: Needs Assistance Bed Mobility: Supine to Sit;Sit to Supine     Supine to sit: Min assist Sit to supine: Min assist   General bed mobility comments: Min assist to help progress left leg to EOB.  Pt needs verbal cues for seqencing and hand placement and 1/2 bridge technique.  Per pt she has a really high bed at home and normally crawls into it forward.  We will need to practice boosting up with a step stool next session as I don't think she can crawl in at this point.   Transfers Overall transfer level: Needs assistance Equipment used: Rolling walker (2 wheeled) Transfers: Sit to/from Stand Sit to Stand: Min assist         General transfer comment: Min assist to help support trunk and stabilize RW during transitions.  Pt needs verbal cues for safe hand placement.   Ambulation/Gait Ambulation/Gait assistance: Min assist Ambulation Distance (Feet): 35  Feet Assistive device: Rolling walker (2 wheeled) Gait Pattern/deviations: Step-through pattern;Antalgic Gait velocity: decreased Gait velocity interpretation: Below normal speed for age/gender General Gait Details: Pt with moderately antalgic gait pattern. Good upright posture.  Pt needed verbal cues for correct/safest LE sequencing.          Balance Overall balance assessment: Needs assistance Sitting-balance support: No upper extremity supported;Feet supported Sitting balance-Leahy Scale: Good     Standing balance support: Bilateral upper extremity supported;No upper extremity supported;Single extremity supported Standing balance-Leahy Scale: Fair                               Pertinent Vitals/Pain Pain Assessment: 0-10 Pain Score: 5  Pain Location: left hip Pain Descriptors / Indicators: Aching;Burning Pain Intervention(s): Limited activity within patient's tolerance;Monitored during session;Repositioned;Patient requesting pain meds-RN notified;Ice applied    Home Living Family/patient expects to be discharged to:: Private residence Living Arrangements: Spouse/significant other Available Help at Discharge: Family;Available 24 hours/day Type of Home: House Home Access: Stairs to enter   Entergy CorporationEntrance Stairs-Number of Steps: 2 Home Layout: One level Home Equipment: None      Prior Function Level of Independence: Independent         Comments: works full time     Extremity/Trunk Assessment   Upper Extremity Assessment: Defer to OT evaluation           Lower Extremity Assessment:  LLE deficits/detail   LLE Deficits / Details: left leg with normal post op pain and weakness.  Ankle 4/5, knee 3-/5, hip 2+/5  Cervical / Trunk Assessment: Other exceptions  Communication   Communication: No difficulties  Cognition Arousal/Alertness: Awake/alert Behavior During Therapy: WFL for tasks assessed/performed Overall Cognitive Status: Within Functional  Limits for tasks assessed                         Exercises Total Joint Exercises Ankle Circles/Pumps: AROM;Both;20 reps;Supine Quad Sets: AROM;Left;10 reps;Supine Heel Slides: AAROM;Left;10 reps;Supine Hip ABduction/ADduction: AAROM;Left;10 reps;Supine      Assessment/Plan    PT Assessment Patient needs continued PT services  PT Diagnosis Difficulty walking;Abnormality of gait;Generalized weakness;Acute pain   PT Problem List Decreased strength;Decreased range of motion;Decreased activity tolerance;Decreased balance;Decreased mobility;Decreased knowledge of use of DME;Pain  PT Treatment Interventions DME instruction;Gait training;Stair training;Functional mobility training;Therapeutic activities;Therapeutic exercise;Balance training;Neuromuscular re-education;Patient/family education;Manual techniques;Modalities   PT Goals (Current goals can be found in the Care Plan section) Acute Rehab PT Goals Patient Stated Goal: to go home PT Goal Formulation: With patient Time For Goal Achievement: 11/22/14 Potential to Achieve Goals: Good    Frequency 7X/week           End of Session Equipment Utilized During Treatment: Gait belt Activity Tolerance: Patient limited by pain Patient left: in bed;with call bell/phone within reach;with family/visitor present Nurse Communication: Patient requests pain meds         Time: 1557-1640 PT Time Calculation (min) (ACUTE ONLY): 43 min   Charges:   PT Evaluation $Initial PT Evaluation Tier I: 1 Procedure PT Treatments $Gait Training: 8-22 mins $Therapeutic Activity: 8-22 mins        Payzlee Ryder B. Cylan Borum, PT, DPT [phone]   11/15/2014, 4:53 PM

## 2014-11-15 NOTE — Plan of Care (Signed)
Problem: Consults Goal: Diagnosis- Total Joint Replacement Outcome: Completed/Met Date Met:  11/15/14 Primary Total Hip Left

## 2014-11-15 NOTE — Progress Notes (Signed)
Utilization review completed. Kemani Heidel, RN, BSN. 

## 2014-11-15 NOTE — Progress Notes (Signed)
Subjective: 1 Day Post-Op Procedure(s) (LRB): LEFT TOTAL HIP ARTHROPLASTY ANTERIOR APPROACH (Left) Patient reports pain as moderate.    Objective: Vital signs in last 24 hours: Temp:  [97 F (36.1 C)-99.4 F (37.4 C)] 98.2 F (36.8 C) (01/27 0528) Pulse Rate:  [57-76] 69 (01/27 0528) Resp:  [15-23] 17 (01/27 0528) BP: (119-151)/(69-85) 119/69 mmHg (01/27 0528) SpO2:  [93 %-100 %] 98 % (01/27 0528) Weight:  [106.198 kg (234 lb 2 oz)] 106.198 kg (234 lb 2 oz) (01/26 1019)  Intake/Output from previous day: 01/26 0701 - 01/27 0700 In: 2240 [P.O.:310; I.V.:1680; IV Piggyback:250] Out: 1375 [Urine:1125; Blood:250] Intake/Output this shift: Total I/O In: -  Out: 100 [Urine:100]   Recent Labs  11/15/14 0550  HGB 11.7*    Recent Labs  11/15/14 0550  WBC 9.5  RBC 4.16  HCT 36.4  PLT 221    Recent Labs  11/15/14 0550  NA 138  K 3.9  CL 103  CO2 26  BUN 10  CREATININE 0.83  GLUCOSE 114*  CALCIUM 8.7   No results for input(s): LABPT, INR in the last 72 hours.  Sensation intact distally Intact pulses distally Dorsiflexion/Plantar flexion intact Incision: scant drainage  Assessment/Plan: 1 Day Post-Op Procedure(s) (LRB): LEFT TOTAL HIP ARTHROPLASTY ANTERIOR APPROACH (Left) Up with therapy  Braxley Balandran Y 11/15/2014, 7:31 AM

## 2014-11-15 NOTE — Progress Notes (Signed)
Occupational Therapy Evaluation Patient Details Name: Lisa [name] MRN: [id number] DOB: 1961/10/20 Today's Date: 11/15/2014    History of Present Illness s/p L THA ant approach   Clinical Impression   Pt making excellent progress. Began education on functional mobility and compensatory techniques for ADL. Will plan to see in am to complete education. Pt will most likely be ready for D/C from OT standpoint Thursday/Friday if medically stable. She has 24/7 assistance available after D/C.    Follow Up Recommendations  No OT follow up;Supervision - Intermittent    Equipment Recommendations  3 in 1 bedside comode    Recommendations for Other Services       Precautions / Restrictions Precautions Precautions: None Restrictions Weight Bearing Restrictions: Yes LLE Weight Bearing: Weight bearing as tolerated      Mobility Bed Mobility      [facility]         General bed mobility comments: pt up on BSC  Transfers Overall transfer level: Needs assistance Equipment used: Rolling walker (2 wheeled) Transfers: Sit to/from Stand Sit to Stand: Min assist              Balance Overall balance assessment: Needs assistance           Standing balance-Leahy Scale: Fair                              ADL Overall ADL's : Needs assistance/impaired     Grooming: Set up   Upper Body Bathing: Set up   Lower Body Bathing: Moderate assistance;Sit to/from stand   Upper Body Dressing : Set up   Lower Body Dressing: Moderate assistance;Sit to/from stand   Toilet Transfer: Minimal assistance;Ambulation;BSC;Cueing for safety;Cueing for sequencing   Toileting- Clothing Manipulation and Hygiene: Min guard;Sit to/from stand;Cueing for sequencing       Functional mobility during ADLs: Minimal assistance;Rolling walker;Cueing for sequencing General ADL Comments: Educated pt on "activity as tolerated" after direct anterior aproach. Pt states that  she had difficulty with LB ADL PTA and is interested in AE for ADL.                                     Pertinent Vitals/Pain Pain Assessment: 0-10 Pain Score: 5  Pain Location: L hip  Pain Descriptors / Indicators: Aching Pain Intervention(s): Limited activity within patient's tolerance;Monitored during session;Repositioned;Ice applied     Hand Dominance     Extremity/Trunk Assessment Upper Extremity Assessment Upper Extremity Assessment: Overall WFL for tasks assessed   Lower Extremity Assessment Lower Extremity Assessment: Defer to PT evaluation   Cervical / Trunk Assessment Cervical / Trunk Assessment: Normal   Communication Communication Communication: No difficulties   Cognition Arousal/Alertness: Awake/alert Behavior During Therapy: WFL for tasks assessed/performed Overall Cognitive Status: Within Functional Limits for tasks assessed                     General Comments   [facility]                 Home Living Family/patient expects to be discharged to:: Private residence Living Arrangements: Spouse/significant other Available Help at Discharge: Family;Available 24 hours/day Type of Home: House Home Access: Stairs to enter [employer] CorporationEntrance Stairs-Number of Steps: 2   Home Layout: One level     Bathroom Shower/Tub:  Tub/shower unit Shower/tub characteristics: Buyer, retail: Yes How Accessible: Accessible via walker Home Equipment: None          Prior Functioning/Environment Level of Independence: Independent             OT Diagnosis: Generalized weakness;Acute pain   OT Problem List: Decreased strength;Decreased range of motion;Decreased activity tolerance;Obesity;Pain;Decreased knowledge of use of DME or AE;Decreased safety awareness   OT Treatment/Interventions: Self-care/ADL training;DME and/or AE instruction;Therapeutic activities;Patient/family education     OT Goals(Current goals can be found in the care plan section) Acute Rehab OT Goals Patient Stated Goal: to go home OT Goal Formulation: With patient Time For Goal Achievement: 11/22/14 Potential to Achieve Goals: Good  OT Frequency: Min 2X/week    End of Session Equipment Utilized During Treatment: Gait belt;Rolling walker Nurse Communication: Mobility status;Precautions;Weight bearing status  Activity Tolerance: Patient tolerated treatment well Patient left: in chair;with call bell/phone within reach   Time: 1610-9604 OT Time Calculation (min): 31 min Charges:  OT General Charges $OT Visit: 1 Procedure OT Evaluation $Initial OT Evaluation Tier I: 1 Procedure OT Treatments $Self Care/Home Management : 8-22 mins G-Codes:    Rickie Gange,HILLARY 2014/11/22, 2:23 PM   [facility], OTR/L  [phone] Nov 22, 2014

## 2014-11-15 NOTE — Progress Notes (Signed)
[facility]/14 [facility]. Spoke with patient, no change in discharge plan.Patient does not have any equipment at home. Contacted Frank with [facility] and requested rolling walker and 3N1 be delivered to patient's room. Family will be available to assist patient after discharge.

## 2014-11-16 ENCOUNTER — Encounter (HOSPITAL_COMMUNITY): Payer: Self-pay | Admitting: Orthopaedic Surgery

## 2014-11-16 LAB — CBC
HCT: 36.8 % (ref 36.0–46.0)
Hemoglobin: 11.8 g/dL — ABNORMAL LOW (ref 12.0–15.0)
MCH: 29 pg (ref 26.0–34.0)
MCHC: 32.1 g/dL (ref 30.0–36.0)
MCV: 90.4 fL (ref 78.0–100.0)
PLATELETS: 185 10*3/uL (ref 150–400)
RBC: 4.07 MIL/uL (ref 3.87–5.11)
RDW: 14.1 % (ref 11.5–15.5)
WBC: 9 10*3/uL (ref 4.0–10.5)

## 2014-11-16 MED ORDER — OXYCODONE-ACETAMINOPHEN 5-325 MG PO TABS
1.0000 | ORAL_TABLET | ORAL | Status: DC | PRN
Start: 2014-11-16 — End: 2015-01-02

## 2014-11-16 MED ORDER — DOCUSATE SODIUM 100 MG PO CAPS: 100.0000 mg | ORAL_CAPSULE | Freq: Two times a day (BID) | ORAL | Status: DC

## 2014-11-16 MED ORDER — METHOCARBAMOL 500 MG PO TABS: 500.0000 mg | ORAL_TABLET | Freq: Four times a day (QID) | ORAL | Status: DC | PRN

## 2014-11-16 MED ORDER — ASPIRIN 325 MG PO TBEC: 325.0000 mg | DELAYED_RELEASE_TABLET | Freq: Two times a day (BID) | ORAL | Status: DC

## 2014-11-16 NOTE — Discharge Summary (Signed)
Patient ID: Lisa [name] MRN: [id number] DOB/AGE: 1961/09/15 54 y.o.  Admit date: 11/14/2014 Discharge date: 11/16/2014  Admission Diagnoses:  Principal Problem:   Osteoarthritis of left hip Active Problems:   Status post total replacement of left hip   Discharge Diagnoses:  Same  Past Medical History  Diagnosis Date  . Essential hypertension, benign   . Obesity, unspecified   . Sarcoidosis     pulmonary and hepatic; followed by Dr. Sherene Sires  . Esophageal reflux   . Unspecified hypothyroidism   . Anemia   . Unspecified vitamin D deficiency     resolved  . Low back pain     Dr. Noel Gerold  . Restless leg syndrome   . Impaired fasting glucose     02/2010--fglu 126, A1c 6.2  . Hyperlipidemia   . Pneumonia     years ago     Surgeries: Procedure(s): LEFT TOTAL HIP ARTHROPLASTY ANTERIOR APPROACH on 11/14/2014   Consultants:    Discharged Condition: Improved  Hospital Course: Lisa [name] is an 54 y.o. female who was admitted 11/14/2014 for operative treatment ofOsteoarthritis of left hip. Patient has severe unremitting pain that affects sleep, daily activities, and work/hobbies. After pre-op clearance the patient was taken to the operating room on 11/14/2014 and underwent  Procedure(s): LEFT TOTAL HIP ARTHROPLASTY ANTERIOR APPROACH.    Patient was given perioperative antibiotics: Anti-infectives    Start     Dose/Rate Route Frequency Ordered Stop   11/14/14 1615  ceFAZolin (ANCEF) IVPB 1 g/50 mL premix     1 g100 mL/hr over 30 Minutes Intravenous Every 6 hours 11/14/14 1613 11/14/14 2237   11/14/14 0600  ceFAZolin (ANCEF) IVPB 2 g/50 mL premix     2 g100 mL/hr over 30 Minutes Intravenous On call to O.R. 11/13/14 1255 11/14/14 1234       Patient was given sequential compression devices, early ambulation, and chemoprophylaxis to prevent DVT.  Patient benefited maximally from hospital stay and there were no complications.    Recent vital signs: Patient Vitals for the past  24 hrs:  BP Temp Temp src Pulse Resp SpO2  11/16/14 0500 128/74 mmHg 99 F (37.2 C) Oral 78 18 95 %  11/15/14 2008 - 98.9 F (37.2 C) Axillary - - -  11/15/14 2006 131/68 mmHg (!) 100.7 F (38.2 C) Oral 89 18 95 %  11/15/14 1637 - 98.6 F (37 C) Oral - - -  11/15/14 1400 127/74 mmHg 99.5 F (37.5 C) - 79 16 97 %     Recent laboratory studies:  Recent Labs  11/15/14 0550 11/16/14 0740  WBC 9.5 9.0  HGB 11.7* 11.8*  HCT 36.4 36.8  PLT 221 185  NA 138  --   K 3.9  --   CL 103  --   CO2 26  --   BUN 10  --   CREATININE 0.83  --   GLUCOSE 114*  --   CALCIUM 8.7  --      Discharge Medications:     Medication List    STOP taking these medications        diclofenac sodium 1 % Gel  Commonly known as:  VOLTAREN      TAKE these medications        albuterol 108 (90 BASE) MCG/ACT inhaler  Commonly known as:  PROAIR HFA  Inhale 2 puffs into the lungs every 3 (three) hours as needed.     aspirin 325 MG EC tablet  Take 1 tablet (  325 mg total) by mouth 2 (two) times daily after a meal.     BIOFREEZE EX  Apply 1 application topically 3 (three) times daily as needed (for pain).     CENTRUM SILVER tablet  Take by mouth daily.     cetirizine 10 MG tablet  Commonly known as:  ZYRTEC  Take 10 mg by mouth at bedtime as needed for allergies.     cycloSPORINE 0.05 % ophthalmic emulsion  Commonly known as:  RESTASIS  Place 1 drop into both eyes 2 (two) times daily.     dextromethorphan 30 MG/5ML liquid  Commonly known as:  DELSYM  2 tsp every 12 hours as needed for cough     docusate sodium 100 MG capsule  Commonly known as:  COLACE  Take 1 capsule (100 mg total) by mouth 2 (two) times daily.     famotidine 20 MG tablet  Commonly known as:  PEPCID  One at bedtime     fluticasone 50 MCG/ACT nasal spray  Commonly known as:  FLONASE  Place 2 sprays into the nose daily as needed for allergies.     levothyroxine 75 MCG tablet  Commonly known as:  SYNTHROID,  LEVOTHROID  TAKE ONE TABLET BY MOUTH EVERY DAY     meclizine 12.5 MG tablet  Commonly known as:  ANTIVERT  Take 1 tablet (12.5 mg total) by mouth 3 (three) times daily as needed.     methocarbamol 500 MG tablet  Commonly known as:  ROBAXIN  Take 1 tablet (500 mg total) by mouth every 6 (six) hours as needed for muscle spasms.     olmesartan-hydrochlorothiazide 20-12.5 MG per tablet  Commonly known as:  BENICAR HCT  Take 1 tablet by mouth daily.     oxyCODONE-acetaminophen 5-325 MG per tablet  Commonly known as:  ROXICET  Take 1-2 tablets by mouth every 4 (four) hours as needed for severe pain.     pantoprazole 40 MG tablet  Commonly known as:  PROTONIX  TAKE ONE TABLET BY MOUTH EVERY DAY     pravastatin 40 MG tablet  Commonly known as:  PRAVACHOL  TAKE ONE TABLET BY MOUTH EVERY DAY     rOPINIRole 0.5 MG tablet  Commonly known as:  REQUIP  Take 0.5 mg by mouth at bedtime as needed (for restless leg). Take 1 tablet at bedtime as needed     SYSTANE 0.4-0.3 % Soln  Generic drug:  Polyethyl Glycol-Propyl Glycol  Apply 1 drop to eye daily. Dry eyes     Vitamin B-12 2500 MCG Subl  Place 1 tablet under the tongue daily.     zolpidem 10 MG tablet  Commonly known as:  AMBIEN  Take 1 tablet (10 mg total) by mouth at bedtime as needed.        Diagnostic Studies: Dg Pelvis Portable  11/14/2014   CLINICAL DATA:  Left total hip arthroplasty, postoperative exam  EXAM: PORTABLE PELVIS 1-2 VIEWS  COMPARISON:  No preoperative imaging  FINDINGS: Two views demonstrate expected postoperative appearance after left total hip arthroplasty. Skin staples are in place. Subcutaneous gas noted.  IMPRESSION: Expected postoperative appearance after left total hip arthroplasty.   Electronically Signed   By: Christiana PellantGretchen  Green M.D.   On: 11/14/2014 16:41   Dg Hip Operative Unilat With Pelvis Left  11/14/2014   CLINICAL DATA:  54 year old female status post left total hip arthroplasty.  EXAM: OPERATIVE  LEFT HIP WITH PELVIS  COMPARISON:  No priors.  FINDINGS: Two  intraoperative fluoroscopic views of the pelvis demonstrate postoperative changes of left total hip arthroplasty. The femoral and acetabular components of the prosthesis appear properly seated without definite periprosthetic fracture on the limited views provided. The prosthetic femoral head appears located.  IMPRESSION: 1. Postoperative changes of left total hip arthroplasty, as above.   Electronically Signed   By: Trudie Reed M.D.   On: 11/14/2014 14:46    Disposition: 01-Home or Self Care      Discharge Instructions    Discharge wound care:    Complete by:  As directed   Keep dressing clean and intact may shower with dressing intact left hip     Elevate operative extremity    Complete by:  As directed   Encourage patient to wiggle toes often     Weight bearing as tolerated    Complete by:  As directed   No hip precautions  Laterality:  left  Extremity:  Lower           Follow-up Information    Follow up with [facility].   Why:  They will contact you to schedule home therapy visists.   Contact information:   [address] [place] [address] [phone]       Follow up with Kathryne Hitch, MD. Schedule an appointment as soon as possible for a visit in 2 weeks.   Specialty:  Orthopedic Surgery   Contact information:   [address] [place] [address] [phone]        Signed: Richardean Canal 11/16/2014, 10:03 AM

## 2014-11-16 NOTE — Progress Notes (Signed)
Lisa [name] discharged home per MD order. Discharge instructions reviewed and discussed with patient. All questions and concerns answered. Copy of instructions and scripts given to patient. IV removed.  Patient escorted to car by staff in a wheelchair. No distress noted upon discharge.   [name], Odyssey Vasbinder R 11/16/2014 7:38 AM

## 2014-11-16 NOTE — Progress Notes (Signed)
Physical Therapy Treatment Patient Details Name: Lisa [name] MRN: [id number] DOB: [birth year] Today's Date: 11/16/2014    History of Present Illness 54 y.o. female admitted to [facility] on 11/15/14 for L direct anterior THA.  Pt with significant PMHx of essential HTN, sarcoidosis, anemia, low back pain, and RLS.    PT Comments    Pt ready and waiting for d/c WC to come to take her out to the car.  Pt and therapist verbally reviewed both bed entry and stair entry to home (LE sequencing).  Pt continues to need close supervision for safety during transitions from sit to stand and gait with RW.  Pt will safely d/c home with husband's assist and HHPT.    Follow Up Recommendations  Home health PT     Equipment Recommendations  Rolling walker with 5" wheels    Recommendations for Other Services   NA     Precautions / Restrictions Precautions Precautions: None Restrictions LLE Weight Bearing: Weight bearing as tolerated    Mobility  Bed Mobility Overal bed mobility: Modified Independent             General bed mobility comments: pt seated EOB  Transfers Overall transfer level: Needs assistance Equipment used: Rolling walker (2 wheeled) Transfers: Sit to/from Stand Sit to Stand: Supervision         General transfer comment: close supervision for safety due to siffness and pain and slow transition to standing.  Safe hand placement with one on the bed and one on RW.   Ambulation/Gait Ambulation/Gait assistance: Supervision Ambulation Distance (Feet): 8 Feet Assistive device: Rolling walker (2 wheeled) Gait Pattern/deviations: Step-through pattern;Antalgic     General Gait Details: Pt with moderately antalgic gait pattern.  Verbal cues for safety when going to back up to [facility].    Stairs Stairs: Yes       General stair comments: Verbally discussed her curb step to enter her home. Up with stronger foot down with sore foot.  Pt reports she has enough space to put the  entire walker flat on the step before the threshold to get into the house.          Balance Overall balance assessment: Needs assistance Sitting-balance support: Feet supported;No upper extremity supported Sitting balance-Leahy Scale: Good     Standing balance support: Bilateral upper extremity supported;No upper extremity supported;Single extremity supported Standing balance-Leahy Scale: Fair                      Cognition Arousal/Alertness: Awake/alert Behavior During Therapy: WFL for tasks assessed/performed Overall Cognitive Status: Within Functional Limits for tasks assessed                         General Comments General comments (skin integrity, edema, etc.): We also verbally discussed getting into and out of the bed and that she may find she can still do it crawling forward it is just dependent on her pain level as she does not have any motion restricitions.       Pertinent Vitals/Pain Pain Assessment: 0-10 Pain Score: 5  Pain Location: left hip Pain Descriptors / Indicators: Aching;Burning Pain Intervention(s): Limited activity within patient's tolerance;Monitored during session;Repositioned           PT Goals (current goals can now be found in the care plan section) Acute Rehab PT Goals Patient Stated Goal: to go home and finally get some rest Progress towards PT goals: Progressing toward goals  Frequency  7X/week    PT Plan Current plan remains appropriate       End of Session   Activity Tolerance: Patient limited by pain Patient left: in chair;Other (comment) (with volunteer to leave the building. )     Time: [id number] PT Time Calculation (min) (ACUTE ONLY): 14 min  Charges:  $Self Care/Home Management: 8-22                      Billye Pickerel B. Marykate Heuberger, PT, DPT [phone]   11/16/2014, 12:03 PM

## 2014-11-16 NOTE — Progress Notes (Signed)
Occupational Therapy Treatment Patient Details Name: Lisa [name] MRN: [id number] DOB: [birth year] Today's Date: 11/16/2014    History of present illness 54 y.o. female admitted to [facility] on 11/15/14 for L direct anterior THA.  Pt with significant PMHx of essential HTN, sarcoidosis, anemia, low back pain, and RLS.   OT comments  Pt making excellent progress. Completed all education regarding ADL and mobility. Pt ready to D/C from OT standpoint.   Follow Up Recommendations  No OT follow up;Supervision - Intermittent    Equipment Recommendations  3 in 1 bedside comode    Recommendations for Other Services      Precautions / Restrictions Precautions Precautions: None Restrictions Weight Bearing Restrictions: Yes LLE Weight Bearing: Weight bearing as tolerated       Mobility Bed Mobility Overal bed mobility: Modified Independent                Transfers Overall transfer level: Needs assistance Equipment used: Rolling walker (2 wheeled) Transfers: Sit to/from Stand Sit to Stand: Supervision              Balance                                   ADL               Lower Body Bathing: Minimal assistance;Sit to/from stand       Lower Body Dressing: With adaptive equipment;Set up;Supervision/safety;Sit to/from stand (able to return demonstrate)   Toilet Transfer: Supervision/safety;Ambulation;BSC (over toilet)   Toileting- Clothing Manipulation and Hygiene: Supervision/safety;Sit to/from stand       Functional mobility during ADLs: Supervision/safety;Rolling walker General ADL Comments: Completed education with use of AE for LB ADL if needed. Educated on techniques for tub transfer using 3 in 1. Pt verbalized understanding.                                      Cognition   Behavior During Therapy: WFL for tasks assessed/performed Overall Cognitive Status: Within Functional Limits for tasks assessed                                                  General Comments      Pertinent Vitals/ Pain       Pain Assessment: 0-10 Pain Score: 5  Pain Location: L hip Pain Descriptors / Indicators: Aching Pain Intervention(s): Limited activity within patient's tolerance;Monitored during session;Repositioned;Ice applied                                                          Frequency       Progress Toward Goals  OT Goals(current goals can now be found in the care plan section)  Progress towards OT goals: Goals met/education completed, patient discharged from OT  Acute Rehab OT Goals Patient Stated Goal: to go home OT Goal Formulation: With patient Time For Goal Achievement: 11/22/14 Potential to Achieve Goals: Good ADL Goals Pt Will Perform Lower Body Bathing: with caregiver independent in assisting;with supervision;with  adaptive equipment;sit to/from stand Pt Will Perform Lower Body Dressing: with supervision;with caregiver independent in assisting;with adaptive equipment;sit to/from stand Pt Will Transfer to Toilet: with supervision;ambulating;bedside commode Pt Will Perform Tub/Shower Transfer: with min guard assist;3 in 1;rolling walker;with caregiver independent in assisting;ambulating  Plan Discharge plan remains appropriate;All goals met and education completed, patient discharged from OT services    Co-evaluation                 End of Session Equipment Utilized During Treatment: Gait belt;Rolling walker   Activity Tolerance Patient tolerated treatment well   Patient Left in chair;with call bell/phone within reach   Nurse Communication Mobility status;Precautions;Weight bearing status        Time: 0223-3612 OT Time Calculation (min): 28 min  Charges: OT General Charges $OT Visit: 1 Procedure OT Treatments $Self Care/Home Management : 23-37 mins  [facility], OTR/L  [phone] 11/16/2014 11/16/2014, 10:11 AM

## 2014-11-16 NOTE — Discharge Instructions (Signed)
Pickup stool softener for constipation. Left lower extremity Weight Bearing as tolerated  No  Left hip precautions Progress activities slowly Expect left hip and possible left knee soreness and swelling. Apply heat or ice as needed. Keep left hip dressing clean  and intact, may shower with dressing intact.

## 2014-11-16 NOTE — Progress Notes (Signed)
Subjective: 2 Days Post-Op Procedure(s) (LRB): LEFT TOTAL HIP ARTHROPLASTY ANTERIOR APPROACH (Left) Patient reports pain as moderate.  Wanting to go home today. Eating and drinking well. Positive flatus. Denies chest pain , SOB, dizziness.  Objective: Vital signs in last 24 hours: Temp:  [98.6 F (37 C)-100.7 F (38.2 C)] 99 F (37.2 C) (01/28 0500) Pulse Rate:  [78-89] 78 (01/28 0500) Resp:  [16-18] 18 (01/28 0500) BP: (127-131)/(68-74) 128/74 mmHg (01/28 0500) SpO2:  [95 %-97 %] 95 % (01/28 0500)  Intake/Output from previous day: 01/27 0701 - 01/28 0700 In: 720 [P.O.:720] Out: 300 [Urine:300] Intake/Output this shift:     Recent Labs  11/15/14 0550 11/16/14 0740  HGB 11.7* 11.8*    Recent Labs  11/15/14 0550 11/16/14 0740  WBC 9.5 9.0  RBC 4.16 4.07  HCT 36.4 36.8  PLT 221 185    Recent Labs  11/15/14 0550  NA 138  K 3.9  CL 103  CO2 26  BUN 10  CREATININE 0.83  GLUCOSE 114*  CALCIUM 8.7   No results for input(s): LABPT, INR in the last 72 hours.  Left lower extremity: Sensation intact distally Intact pulses distally Dorsiflexion/Plantar flexion intact Incision: dressing C/D/I Compartment soft  Assessment/Plan: 2 Days Post-Op Procedure(s) (LRB): LEFT TOTAL HIP ARTHROPLASTY ANTERIOR APPROACH (Left) Up with therapy Discharge home with home health  Richardean CanalCLARK, Cailie Bosshart 11/16/2014, 9:55 AM

## 2014-11-17 ENCOUNTER — Telehealth: Payer: Self-pay | Admitting: Family Medicine

## 2014-11-17 DIAGNOSIS — K219 Gastro-esophageal reflux disease without esophagitis: Secondary | ICD-10-CM

## 2014-11-17 DIAGNOSIS — E039 Hypothyroidism, unspecified: Secondary | ICD-10-CM

## 2014-11-17 DIAGNOSIS — I10 Essential (primary) hypertension: Secondary | ICD-10-CM

## 2014-11-17 NOTE — Telephone Encounter (Signed)
Needs refills on benicar, levothyroxine, pravachol and pantoprazole   She is out  [facility]/02/15   [facility]

## 2014-11-19 MED ORDER — PRAVASTATIN SODIUM 40 MG PO TABS: ORAL_TABLET | ORAL | Status: DC

## 2014-11-19 MED ORDER — OLMESARTAN MEDOXOMIL-HCTZ 20-12.5 MG PO TABS: 1.0000 | ORAL_TABLET | Freq: Every day | ORAL | Status: DC

## 2014-11-19 MED ORDER — PANTOPRAZOLE SODIUM 40 MG PO TBEC: DELAYED_RELEASE_TABLET | ORAL | Status: DC

## 2014-11-19 MED ORDER — LEVOTHYROXINE SODIUM 75 MCG PO TABS: ORAL_TABLET | ORAL | Status: DC

## 2015-01-02 ENCOUNTER — Encounter: Payer: Self-pay | Admitting: Family Medicine

## 2015-01-02 ENCOUNTER — Ambulatory Visit (INDEPENDENT_AMBULATORY_CARE_PROVIDER_SITE_OTHER): Payer: BLUE CROSS/BLUE SHIELD | Admitting: Family Medicine

## 2015-01-02 VITALS — BP 116/78 | HR 71 | Ht 67.5 in | Wt 227.0 lb

## 2015-01-02 DIAGNOSIS — Z0000 Encounter for general adult medical examination without abnormal findings: Secondary | ICD-10-CM

## 2015-01-02 DIAGNOSIS — E039 Hypothyroidism, unspecified: Secondary | ICD-10-CM | POA: Diagnosis not present

## 2015-01-02 DIAGNOSIS — I10 Essential (primary) hypertension: Secondary | ICD-10-CM | POA: Diagnosis not present

## 2015-01-02 DIAGNOSIS — D869 Sarcoidosis, unspecified: Secondary | ICD-10-CM

## 2015-01-02 DIAGNOSIS — Z23 Encounter for immunization: Secondary | ICD-10-CM | POA: Diagnosis not present

## 2015-01-02 DIAGNOSIS — K2100 Gastro-esophageal reflux disease with esophagitis, without bleeding: Secondary | ICD-10-CM

## 2015-01-02 DIAGNOSIS — E669 Obesity, unspecified: Secondary | ICD-10-CM

## 2015-01-02 DIAGNOSIS — J4520 Mild intermittent asthma, uncomplicated: Secondary | ICD-10-CM

## 2015-01-02 DIAGNOSIS — R7301 Impaired fasting glucose: Secondary | ICD-10-CM

## 2015-01-02 LAB — LIPID PANEL
CHOL/HDL RATIO: 4.3 ratio
Cholesterol: 184 mg/dL (ref 0–200)
HDL: 43 mg/dL — AB (ref >=46)
LDL CALC: 120 mg/dL — AB (ref 0–99)
Triglycerides: 105 mg/dL (ref <150)
VLDL: 21 mg/dL (ref 0–40)

## 2015-01-02 LAB — TSH: TSH: 5.701 u[IU]/mL — AB (ref 0.350–4.500)

## 2015-01-02 NOTE — Progress Notes (Signed)
Subjective:    Patient ID: Lisa [name], female    DOB: [birth year], 54 y.o.   MRN: [id number]  HPI She is here for an annual exam. She had a hip replacement in January and is doing well other than a little stiffness after sitting.  She continues doing some of the therapy exercises at home and stands up for 8 hours at work.  Reports aches and pains to bilateral shoulders, states her job makes these worse but pain relieved with topical analgesic. She has not had to use albuterol inhaler in almost 2 years and states her sarcoidosis is "in remission". Denies fever, chills, chest pain, cough,DOE, fatigue, GI/GU complaints.  Started taking Zyrtec at nighttime last week for seasonal allergies and plans to start using Flonase again for same.  She is taking her thyroid and  blood pressure medications daily.  She is also taking Protonix daily in the morning and Pepcid nightly for GERD.  Last eye exam was a few months ago and she continues to use eye drops daily. Restless leg syndrome- bothersome occasionally but medication helps this.  She is sleeping better and no longer using Ambien. Up to date with mammogram in 2015. Colonoscopy every 5 years, last one normal but previously she had polyps and father had colon cancer. Dexa in 2009 and she questions if she should have another one. She is not taking Calcium or Vitamin D currently.  She has hysterectomy in 2006 due to heavy and frequent cycles.  She has not smoked since 2006, drinks alcohol occasionally. Eats whatever she wants and is not exercising. She has not had Tdap vaccine. Received Pneumonia 23 vaccine 3 years ago. Medications, family and social history reviewed. Immunizations and health maintenance discussed.      Review of Systems  All other systems reviewed and are negative.      Objective:   Physical Exam BP 116/78 mmHg  Pulse 71  Ht 5' 7.5" (1.715 m)  Wt 227 lb (102.967 kg)  BMI 35.01 kg/m2  SpO2 98%  General Appearance:     Alert, cooperative, no distress, appears stated age  Head:    Normocephalic, without obvious abnormality, atraumatic  Eyes:    PERRL, conjunctiva/corneas clear, EOM's intact, fundi    benign  Ears:    Normal TM's and external ear canals  Nose:   Nares normal, mucosa normal, no drainage or sinus   tenderness  Throat:   Lips, mucosa, and tongue normal; teeth and gums normal  Neck:   Supple, no lymphadenopathy;  thyroid:  no   enlargement/tenderness/nodules  Back:    Spine nontender, no curvature, ROM normal, no CVA     tenderness  Lungs:     Clear to auscultation bilaterally without wheezes, rales or     ronchi; respirations unlabored  Chest Wall:    No tenderness or deformity   Heart:    Regular rate and rhythm, S1 and S2 normal, no murmur, rub   or gallop  Breast Exam:    Deferred to GYN  Abdomen:     Soft, non-tender, nondistended, normoactive bowel sounds,    no masses, no hepatosplenomegaly  Genitalia:    Deferred to GYN     Extremities:   No clubbing, cyanosis or edema  Pulses:   2+ and symmetric all extremities  Skin:   Skin color, texture, turgor normal, no rashes or lesions  Lymph nodes:   Cervical, supraclavicular, and axillary nodes normal  Neurologic:   CNII-XII intact, normal strength,  sensation and gait; reflexes 2+ and symmetric throughout          Psych:   Normal mood, affect, hygiene and grooming.          Assessment & Plan:  Routine general medical examination at a health care facility  Essential hypertension  Asthma, mild intermittent, uncomplicated  Gastroesophageal reflux disease with esophagitis  Need for prophylactic vaccination with combined diphtheria-tetanus-pertussis (DTP) vaccine - Plan: Tdap vaccine greater than or equal to 7yo IM  Sarcoidosis  Obesity  Hypothyroidism, unspecified hypothyroidism type  Follow up as recommended by Dr. Sherene Sires for sarcoidosis and asthma. Buy Flonase over the counter and start using for seasonal allergies. Try taking  Protonix every other day as opposed to daily and see if reflux can still be managed well.  She does not need another DEXA since her last one was fairly recent and normal. Check your multivitamin and make sure it contains Vitamin D 800 IU.  She is doing a good job with medication compliance and getting appropriate screening and immunizations. Discussed eating a healthy diet by reducing amount of white foods and not drinking her calories with sodas. Also encouraged increasing physical activity by doing the types of exercises she enjoys such as water aerobics and zumba.

## 2015-01-03 LAB — HEMOGLOBIN A1C
HEMOGLOBIN A1C: 5.5 % (ref <5.7)
Mean Plasma Glucose: 111 mg/dL (ref <117)

## 2015-05-03 ENCOUNTER — Encounter: Payer: Self-pay | Admitting: Family Medicine

## 2015-05-03 ENCOUNTER — Ambulatory Visit (INDEPENDENT_AMBULATORY_CARE_PROVIDER_SITE_OTHER): Payer: BLUE CROSS/BLUE SHIELD | Admitting: Family Medicine

## 2015-05-03 VITALS — BP 110/70 | HR 66 | Wt 236.0 lb

## 2015-05-03 DIAGNOSIS — R252 Cramp and spasm: Secondary | ICD-10-CM

## 2015-05-03 DIAGNOSIS — E039 Hypothyroidism, unspecified: Secondary | ICD-10-CM

## 2015-05-03 DIAGNOSIS — R609 Edema, unspecified: Secondary | ICD-10-CM | POA: Diagnosis not present

## 2015-05-03 LAB — CBC WITH DIFFERENTIAL/PLATELET
Basophils Absolute: 0 10*3/uL (ref 0.0–0.1)
Basophils Relative: 0 % (ref 0–1)
Eosinophils Absolute: 0.1 10*3/uL (ref 0.0–0.7)
Eosinophils Relative: 2 % (ref 0–5)
HEMATOCRIT: 38.7 % (ref 36.0–46.0)
Hemoglobin: 12.7 g/dL (ref 12.0–15.0)
LYMPHS ABS: 2.2 10*3/uL (ref 0.7–4.0)
LYMPHS PCT: 34 % (ref 12–46)
MCH: 28.4 pg (ref 26.0–34.0)
MCHC: 32.8 g/dL (ref 30.0–36.0)
MCV: 86.6 fL (ref 78.0–100.0)
MPV: 11.7 fL (ref 8.6–12.4)
Monocytes Absolute: 0.5 10*3/uL (ref 0.1–1.0)
Monocytes Relative: 7 % (ref 3–12)
NEUTROS ABS: 3.8 10*3/uL (ref 1.7–7.7)
NEUTROS PCT: 57 % (ref 43–77)
Platelets: 243 10*3/uL (ref 150–400)
RBC: 4.47 MIL/uL (ref 3.87–5.11)
RDW: 15.3 % (ref 11.5–15.5)
WBC: 6.6 10*3/uL (ref 4.0–10.5)

## 2015-05-03 LAB — TSH: TSH: 2.999 u[IU]/mL (ref 0.350–4.500)

## 2015-05-03 NOTE — Progress Notes (Signed)
   Subjective:    Patient ID: Lisa [name], female    DOB: August 21, 1961, 54 y.o.   MRN: [id number]  HPI She is here for evaluation of a several month history of intermittent right medial calf swelling. She also has noted over the last several months since returning to work but she does have lower extremity swelling. The swelling does tend to go away when she gets up in the morning and gets worse as the day goes on.She has had no chest pain, shortness of breath, weakness, numbness or tingling. She also notes bilateral calf swelling again since she has started back to work. She did have left hip replacement. She also needs her thyroid rechecked. She continues on her Synthroid but does not always take it on an empty stomach. She also has not followed up on whether she is getting different generic products.   Review of Systems     Objective:   Physical Exam Alert and in no distress. Homans sign is negative bilaterally. Slight increase in swelling is noted to the medial aspect of the right gastroc. Pulses normal. Skin is normal in texture and appearance. Her blood work was reviewed.      Assessment & Plan:  Hypothyroidism, unspecified hypothyroidism type - Plan: TSH  Dependent edema - Plan: CBC with Differential/Platelet, Comprehensive metabolic panel  Cramp of both lower extremities - Plan: CBC with Differential/Platelet, Comprehensive metabolic panel Reinforced the need for her to take her thyroid on an empty stomach. Also discussed use of support stockings to help with the dependent edema and also the edema in the medial calf area. Recommend stretching and Tylenol/NSAID to help with the cramping.

## 2015-05-04 LAB — COMPREHENSIVE METABOLIC PANEL
ALBUMIN: 3.9 g/dL (ref 3.5–5.2)
ALT: 25 U/L (ref 0–35)
AST: 26 U/L (ref 0–37)
Alkaline Phosphatase: 86 U/L (ref 39–117)
BUN: 16 mg/dL (ref 6–23)
CALCIUM: 8.8 mg/dL (ref 8.4–10.5)
CO2: 26 mEq/L (ref 19–32)
Chloride: 109 mEq/L (ref 96–112)
Creat: 0.87 mg/dL (ref 0.50–1.10)
Glucose, Bld: 83 mg/dL (ref 70–99)
Potassium: 3.8 mEq/L (ref 3.5–5.3)
Sodium: 147 mEq/L — ABNORMAL HIGH (ref 135–145)
Total Bilirubin: 0.3 mg/dL (ref 0.2–1.2)
Total Protein: 6.7 g/dL (ref 6.0–8.3)

## 2015-05-07 MED ORDER — LEVOTHYROXINE SODIUM 75 MCG PO TABS: ORAL_TABLET | ORAL | Status: DC

## 2015-05-07 NOTE — Addendum Note (Signed)
Addended by: Ronnald NianLALONDE, JOHN C on: 05/07/2015 12:22 PM   Modules accepted: Orders

## 2015-05-10 ENCOUNTER — Other Ambulatory Visit: Payer: Self-pay

## 2015-05-10 DIAGNOSIS — Z1231 Encounter for screening mammogram for malignant neoplasm of breast: Secondary | ICD-10-CM

## 2015-07-30 ENCOUNTER — Ambulatory Visit
Admission: RE | Admit: 2015-07-30 | Discharge: 2015-07-30 | Disposition: A | Discharge status: Home / Self Care | Payer: BLUE CROSS/BLUE SHIELD | Source: Ambulatory Visit

## 2015-07-30 DIAGNOSIS — Z1231 Encounter for screening mammogram for malignant neoplasm of breast: Secondary | ICD-10-CM

## 2015-08-10 ENCOUNTER — Other Ambulatory Visit (INDEPENDENT_AMBULATORY_CARE_PROVIDER_SITE_OTHER): Payer: BLUE CROSS/BLUE SHIELD

## 2015-08-10 DIAGNOSIS — Z23 Encounter for immunization: Secondary | ICD-10-CM | POA: Diagnosis not present

## 2015-10-03 ENCOUNTER — Telehealth: Payer: Self-pay | Admitting: Family Medicine

## 2015-10-03 DIAGNOSIS — K219 Gastro-esophageal reflux disease without esophagitis: Secondary | ICD-10-CM

## 2015-10-03 DIAGNOSIS — E039 Hypothyroidism, unspecified: Secondary | ICD-10-CM

## 2015-10-03 DIAGNOSIS — I10 Essential (primary) hypertension: Secondary | ICD-10-CM

## 2015-10-03 NOTE — Telephone Encounter (Signed)
Pt called and just had a new grand baby and needs the whooping cough injection, and her husband will also need it to, just need approval first, pt also needs RX for synthroid, protonix,benicar HCT, and pravastatin and pt would like it sent to [id number] - [place] - [address] and pt can be reached at [phone], and would like to be called when able to come to get whooping cough shot, baby is coming home Thursday,

## 2015-10-04 MED ORDER — PRAVASTATIN SODIUM 40 MG PO TABS: ORAL_TABLET | ORAL | Status: DC

## 2015-10-04 MED ORDER — PANTOPRAZOLE SODIUM 40 MG PO TBEC: DELAYED_RELEASE_TABLET | ORAL | Status: DC

## 2015-10-04 MED ORDER — LEVOTHYROXINE SODIUM 75 MCG PO TABS: ORAL_TABLET | ORAL | Status: DC

## 2015-10-04 MED ORDER — OLMESARTAN MEDOXOMIL-HCTZ 20-12.5 MG PO TABS: 1.0000 | ORAL_TABLET | Freq: Every day | ORAL | Status: DC

## 2015-10-04 NOTE — Telephone Encounter (Signed)
Called and left a message for pt to call me back. Sent in a 90 day to pharmacy and needs her to make an appt in march to come back in.

## 2015-10-04 NOTE — Telephone Encounter (Signed)
She had her shot and does not need another one. Check on her husband to see if he needs the TDaP. Renew her medications to last through March and have her set up an appointment at that time

## 2015-10-05 NOTE — Telephone Encounter (Signed)
Left message for pt to call me back 

## 2015-10-11 NOTE — Telephone Encounter (Signed)
LMTCB

## 2015-10-11 NOTE — Telephone Encounter (Signed)
Pt is scheduled °

## 2015-10-16 IMAGING — RF DG HIP (WITH PELVIS) OPERATIVE*L*
1 series · 2 of 2 positions shown · non-contrast
Comparison: No priors.

CLINICAL DATA: 53-year-old female status post left total hip
arthroplasty.

EXAM:
OPERATIVE LEFT HIP WITH PELVIS

[Series 1: run · 2 of 2 slices shown]
[im 1/2]
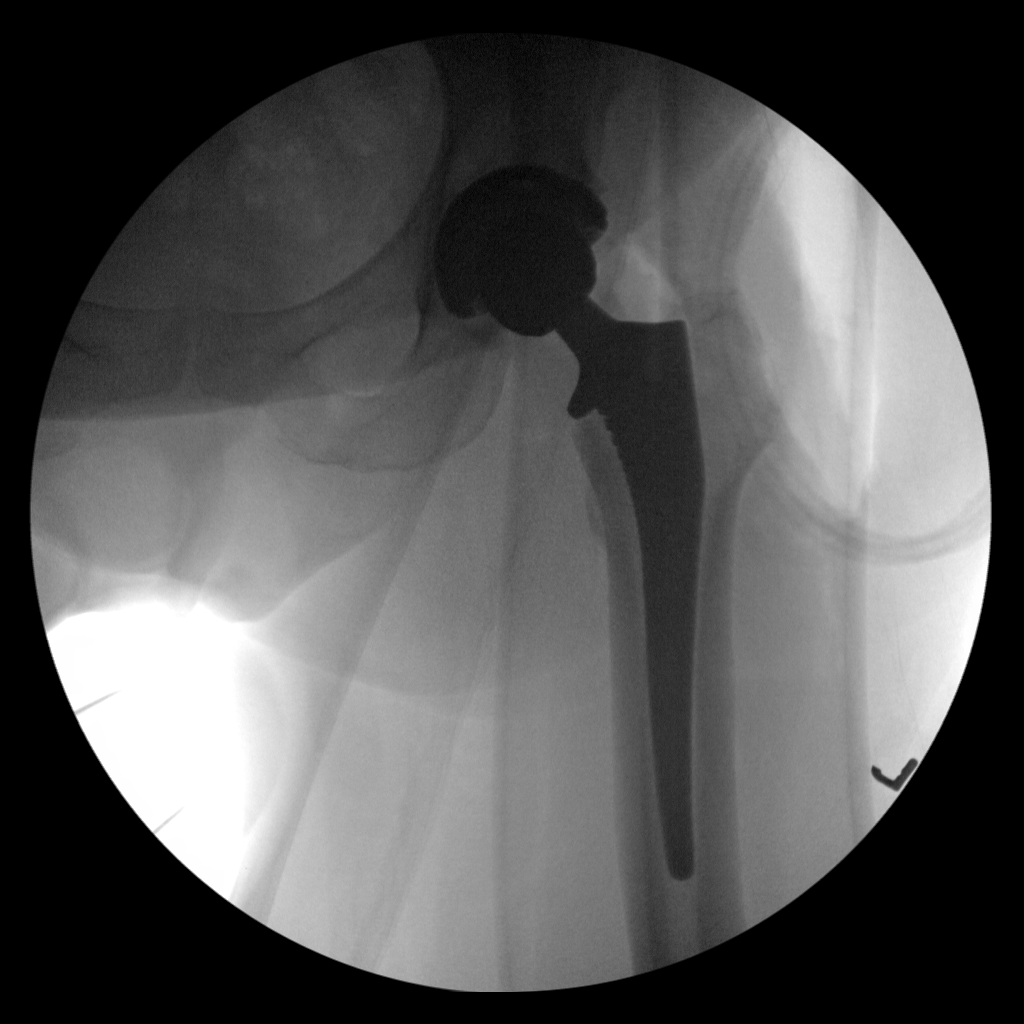
[im 2/2]
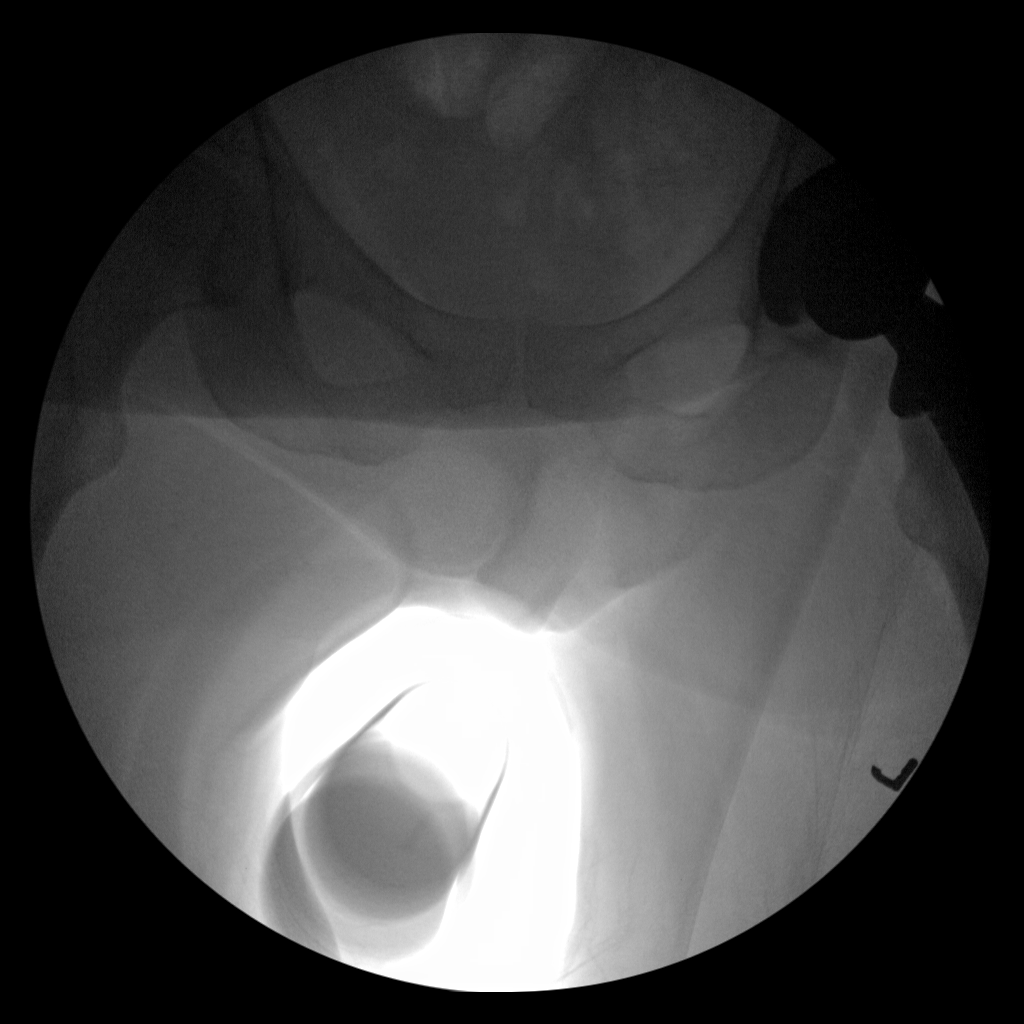

[2 of 2 positions shown; findings below may reference images not displayed]

FINDINGS: Two intraoperative fluoroscopic views of the pelvis demonstrate
postoperative changes of left total hip arthroplasty. The femoral
and acetabular components of the prosthesis appear properly seated
without definite periprosthetic fracture on the limited views
provided. The prosthetic femoral head appears located.
IMPRESSION: 1. Postoperative changes of left total hip arthroplasty, as above.

## 2015-10-16 IMAGING — CR DG PORTABLE PELVIS
2 series · 2 of 2 positions shown · non-contrast
Comparison: No preoperative imaging

CLINICAL DATA: Left total hip arthroplasty, postoperative exam

EXAM:
PORTABLE PELVIS 1-2 VIEWS

[AP (1 of 2)]
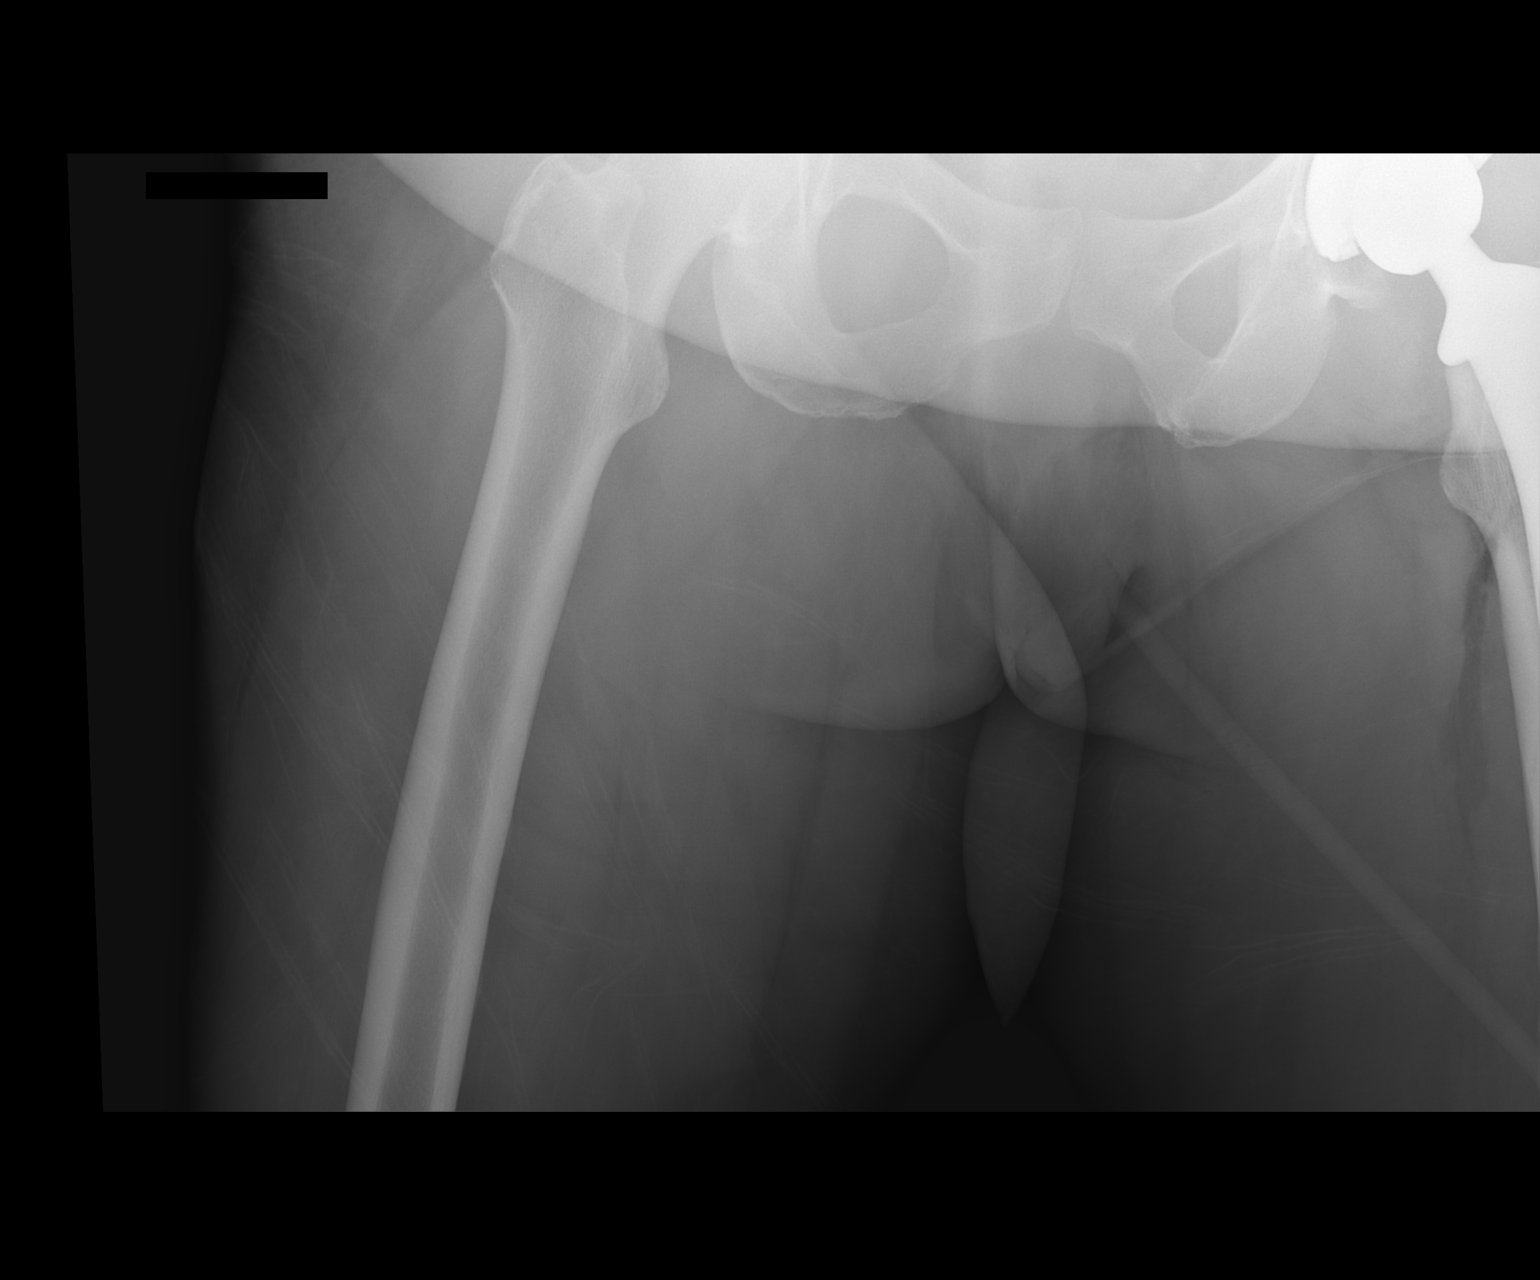

[AP (2 of 2)]
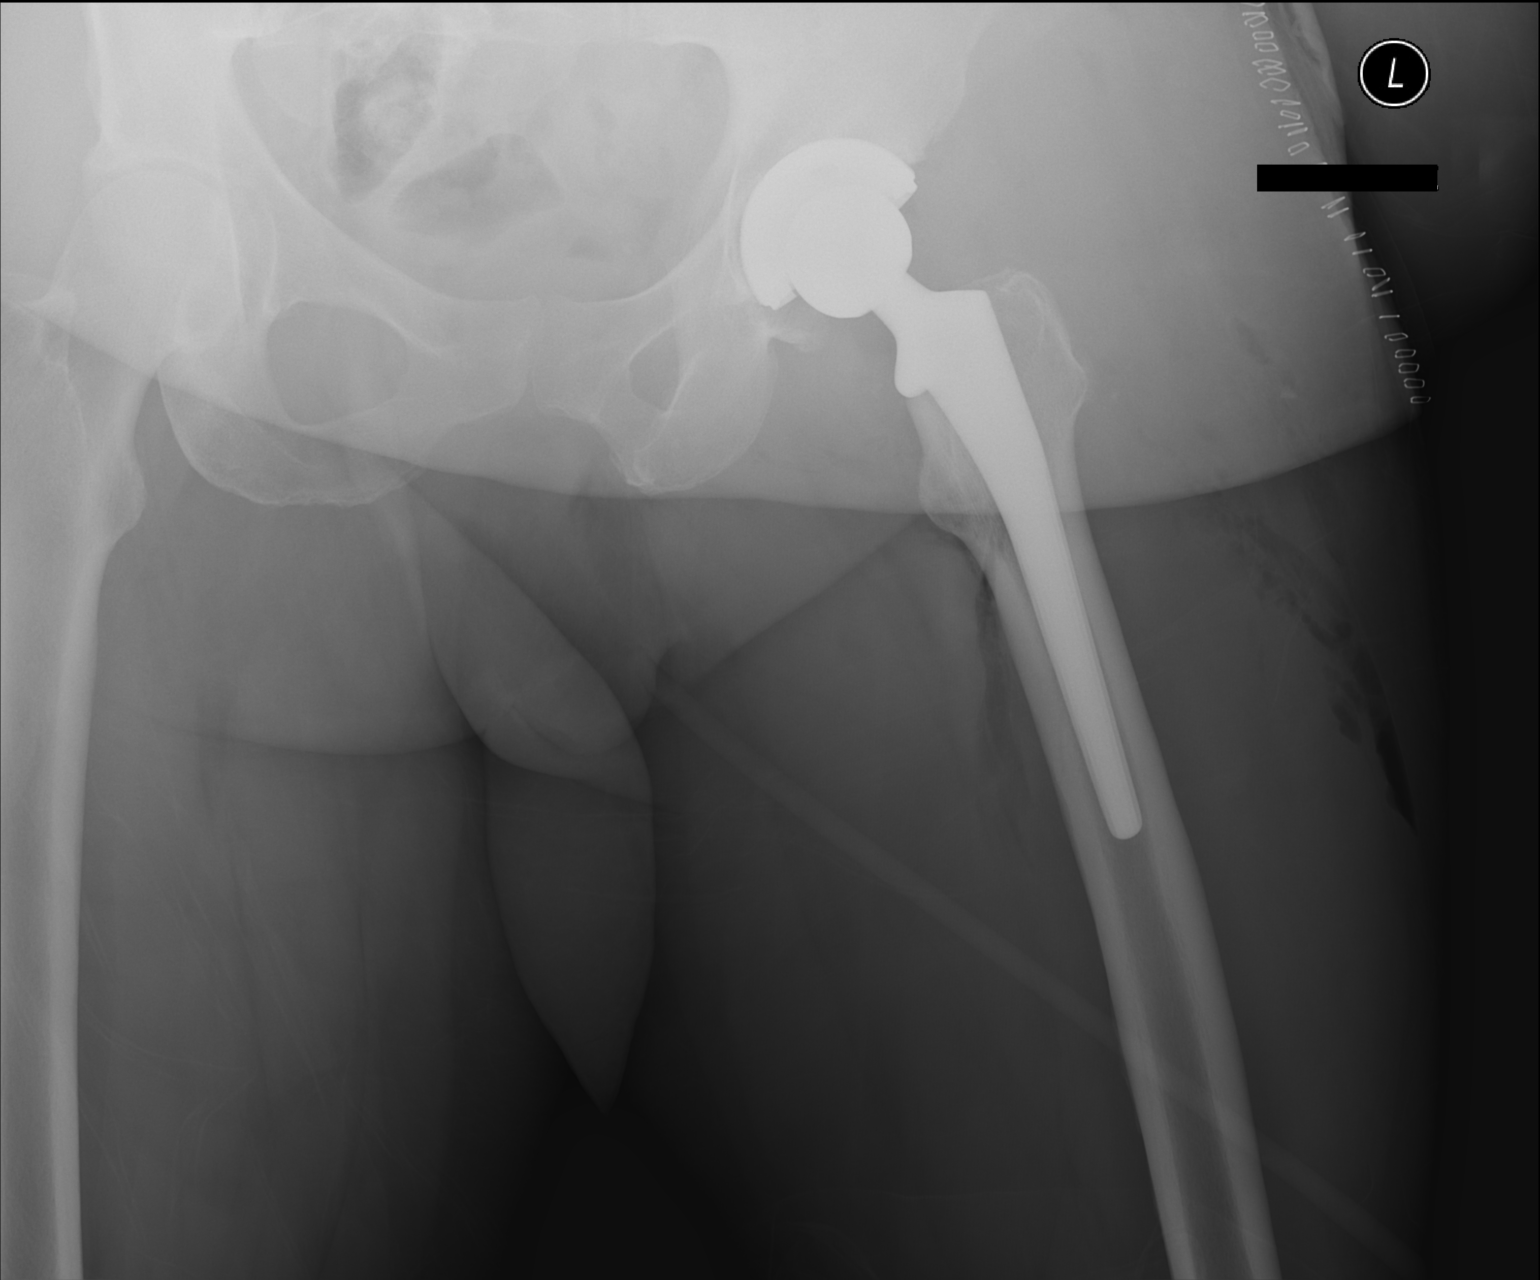

[2 of 2 positions shown; findings below may reference images not displayed]

FINDINGS: Two views demonstrate expected postoperative appearance after left
total hip arthroplasty. Skin staples are in place. Subcutaneous gas
noted.
IMPRESSION: Expected postoperative appearance after left total hip arthroplasty.

## 2016-01-04 ENCOUNTER — Encounter: Payer: Self-pay | Admitting: Family Medicine

## 2016-01-04 ENCOUNTER — Telehealth: Payer: Self-pay | Admitting: Family Medicine

## 2016-01-04 ENCOUNTER — Ambulatory Visit (INDEPENDENT_AMBULATORY_CARE_PROVIDER_SITE_OTHER): Payer: Managed Care, Other (non HMO) | Admitting: Family Medicine

## 2016-01-04 VITALS — BP 160/102 | HR 63 | Ht 67.0 in | Wt 238.0 lb

## 2016-01-04 DIAGNOSIS — E039 Hypothyroidism, unspecified: Secondary | ICD-10-CM | POA: Diagnosis not present

## 2016-01-04 DIAGNOSIS — Z862 Personal history of diseases of the blood and blood-forming organs and certain disorders involving the immune mechanism: Secondary | ICD-10-CM | POA: Diagnosis not present

## 2016-01-04 DIAGNOSIS — R7301 Impaired fasting glucose: Secondary | ICD-10-CM | POA: Diagnosis not present

## 2016-01-04 DIAGNOSIS — M25512 Pain in left shoulder: Secondary | ICD-10-CM

## 2016-01-04 DIAGNOSIS — Z23 Encounter for immunization: Secondary | ICD-10-CM

## 2016-01-04 DIAGNOSIS — Z96649 Presence of unspecified artificial hip joint: Secondary | ICD-10-CM | POA: Diagnosis not present

## 2016-01-04 DIAGNOSIS — G2581 Restless legs syndrome: Secondary | ICD-10-CM | POA: Diagnosis not present

## 2016-01-04 DIAGNOSIS — K219 Gastro-esophageal reflux disease without esophagitis: Secondary | ICD-10-CM

## 2016-01-04 DIAGNOSIS — J302 Other seasonal allergic rhinitis: Secondary | ICD-10-CM | POA: Diagnosis not present

## 2016-01-04 DIAGNOSIS — I10 Essential (primary) hypertension: Secondary | ICD-10-CM

## 2016-01-04 DIAGNOSIS — E785 Hyperlipidemia, unspecified: Secondary | ICD-10-CM

## 2016-01-04 DIAGNOSIS — Z0000 Encounter for general adult medical examination without abnormal findings: Secondary | ICD-10-CM | POA: Diagnosis not present

## 2016-01-04 DIAGNOSIS — Z1159 Encounter for screening for other viral diseases: Secondary | ICD-10-CM

## 2016-01-04 DIAGNOSIS — Z96642 Presence of left artificial hip joint: Secondary | ICD-10-CM

## 2016-01-04 DIAGNOSIS — E669 Obesity, unspecified: Secondary | ICD-10-CM

## 2016-01-04 LAB — COMPREHENSIVE METABOLIC PANEL
ALBUMIN: 4.2 g/dL (ref 3.6–5.1)
ALT: 24 U/L (ref 6–29)
AST: 24 U/L (ref 10–35)
Alkaline Phosphatase: 92 U/L (ref 33–130)
BUN: 12 mg/dL (ref 7–25)
CO2: 27 mmol/L (ref 20–31)
Calcium: 9.2 mg/dL (ref 8.6–10.4)
Chloride: 102 mmol/L (ref 98–110)
Creat: 0.83 mg/dL (ref 0.50–1.05)
Glucose, Bld: 94 mg/dL (ref 65–99)
Potassium: 3.8 mmol/L (ref 3.5–5.3)
SODIUM: 139 mmol/L (ref 135–146)
TOTAL PROTEIN: 7 g/dL (ref 6.1–8.1)
Total Bilirubin: 0.5 mg/dL (ref 0.2–1.2)

## 2016-01-04 LAB — CBC WITH DIFFERENTIAL/PLATELET
BASOS ABS: 0 10*3/uL (ref 0.0–0.1)
BASOS PCT: 0 % (ref 0–1)
EOS ABS: 0.1 10*3/uL (ref 0.0–0.7)
Eosinophils Relative: 2 % (ref 0–5)
HCT: 40 % (ref 36.0–46.0)
HEMOGLOBIN: 13.6 g/dL (ref 12.0–15.0)
LYMPHS ABS: 2.2 10*3/uL (ref 0.7–4.0)
Lymphocytes Relative: 35 % (ref 12–46)
MCH: 29.8 pg (ref 26.0–34.0)
MCHC: 34 g/dL (ref 30.0–36.0)
MCV: 87.5 fL (ref 78.0–100.0)
MONOS PCT: 5 % (ref 3–12)
MPV: 11.2 fL (ref 8.6–12.4)
Monocytes Absolute: 0.3 10*3/uL (ref 0.1–1.0)
NEUTROS ABS: 3.7 10*3/uL (ref 1.7–7.7)
NEUTROS PCT: 58 % (ref 43–77)
PLATELETS: 230 10*3/uL (ref 150–400)
RBC: 4.57 MIL/uL (ref 3.87–5.11)
RDW: 14.8 % (ref 11.5–15.5)
WBC: 6.3 10*3/uL (ref 4.0–10.5)

## 2016-01-04 LAB — POCT URINALYSIS DIP (MANUAL ENTRY)
BILIRUBIN UA: NEGATIVE
BILIRUBIN UA: NEGATIVE
Glucose, UA: NEGATIVE
LEUKOCYTES UA: NEGATIVE
NITRITE UA: NEGATIVE
PH UA: 6
Spec Grav, UA: 1.02
Urobilinogen, UA: NEGATIVE

## 2016-01-04 LAB — LIPID PANEL
CHOL/HDL RATIO: 3.7 ratio (ref <=5.0)
CHOLESTEROL: 175 mg/dL (ref 125–200)
HDL: 47 mg/dL (ref >=46)
LDL Cholesterol: 112 mg/dL (ref <130)
Triglycerides: 80 mg/dL (ref <150)
VLDL: 16 mg/dL (ref <30)

## 2016-01-04 LAB — POCT GLYCOSYLATED HEMOGLOBIN (HGB A1C): Hemoglobin A1C: 5.4

## 2016-01-04 LAB — TSH: TSH: 5.61 mIU/L — ABNORMAL HIGH

## 2016-01-04 NOTE — Telephone Encounter (Signed)
CALLED AND LEFT MESSAGE THAT PT NEEDS TO SCHEDULE A BLOOD PRESSURE FOLLOW UP WITH DR Susann GivensLALONDE

## 2016-01-04 NOTE — Progress Notes (Signed)
Subjective:    Patient ID: Lisa [name], female    DOB: May 18, 1961, 55 y.o.   MRN: [id number]  HPI She is here for a complete examination. She does have an underlying history of sarcoidosis however she has not been on steroids in 2013. She was seen in the past by Dr. Ardith Dark recommendation was follow-up was no longer warranted since she seemed to be fairly stable. She has not had difficulty with cough in well over a year and no evidence of asthma or use of an inhaler. She does have underlying reflux disease and does use pantoprazole and Pepcid. This was apparently also use for her coughing. She has not had any cough or reflux symptoms in quite some time. She has a history of RLS and uses Requip on an as-needed basis. She does complain of left shoulder pain but uses Biofreeze which helps with her pain. She does have underlying allergies and uses Flonase and Zyrtec with good results. She also has dry eyes and will be seeing her ophthalmologist concerning this. She continues on thyroid medication without trouble. He has a previous history of glucose intoleranceHer work and home life are going well. Family and social history as well as health maintenance and immunizations were reviewed. She otherwise has no concerns or questions.  Review of Systems  All other systems reviewed and are negative.      Objective:   Physical Exam BP 160/102 mmHg  Pulse 63  Ht  (1.702 m)  Wt 238 lb (107.956 kg)  BMI 37.27 kg/m2  SpO2 99%  General Appearance:    Alert, cooperative, no distress, appears stated age  Head:    Normocephalic, without obvious abnormality, atraumatic  Eyes:    PERRL, conjunctiva/corneas clear, EOM's intact, fundi    benign  Ears:    Normal TM's and external ear canals  Nose:   Nares normal, mucosa normal, no drainage or sinus   tenderness  Throat:   Lips, mucosa, and tongue normal; teeth and gums normal  Neck:   Supple, no lymphadenopathy;  thyroid:  no    enlargement/tenderness/nodules; no carotid   bruit or JVD  Back:    Spine nontender, no curvature, ROM normal, no CVA     tenderness  Lungs:     Clear to auscultation bilaterally without wheezes, rales or     ronchi; respirations unlabored  Chest Wall:    No tenderness or deformity   Heart:    Regular rate and rhythm, S1 and S2 normal, no murmur, rub   or gallop  Breast Exam:    Deferred to GYN  Abdomen:     Soft, non-tender, nondistended, normoactive bowel sounds,    no masses, no hepatosplenomegaly  Genitalia:    Deferred to GYN     Extremities:   No clubbing, cyanosis or edema  Pulses:   2+ and symmetric all extremities  Skin:   Skin color, texture, turgor normal, no rashes or lesions  Lymph nodes:   Cervical, supraclavicular, and axillary nodes normal  Neurologic:   CNII-XII intact, normal strength, sensation and gait; reflexes 2+ and symmetric throughout          Psych:   Normal mood, affect, hygiene and grooming.     A1C is 5.4     Assessment & Plan:  Routine general medical examination at a health care facility - Plan: POCT urinalysis dipstick, CBC with Differential/Platelet, Comprehensive metabolic panel, Lipid panel  History of sarcoidosis  Status post total replacement of  left hip  Obesity - Plan: CBC with Differential/Platelet, Comprehensive metabolic panel, Lipid panel  Impaired fasting glucose - Plan: HgB A1c  Hypothyroidism, unspecified hypothyroidism type - Plan: TSH  Pain in joint of left shoulder  Gastroesophageal reflux disease, esophagitis presence not specified  Hyperlipidemia with target LDL less than 100 - Plan: Lipid panel  Essential hypertension  Other seasonal allergic rhinitis  RLS (restless legs syndrome)  Need for prophylactic vaccination against Streptococcus pneumoniae (pneumococcus) - Plan: Pneumococcal conjugate vaccine 13-valent  Need for hepatitis C screening test - Plan: Hepatitis C antibody recommend she back off on the PPI to  every other day then every third day and possibly on an as-needed basis. She is in to do this with Pepcid. She will continue to use her Requip on an as-needed basis. Follow-up on her blood pressure in 1 month. Continue on her allergy medications. I will change her thyroid and cholesterol based on blood work.annual allergy medications.

## 2016-01-04 NOTE — Patient Instructions (Addendum)
Start taking your Protonix every other day and see if you stay under control and if you do then go to every third day. Spread it out as far as she can to remain symptom-free. Do thething with Pepcid

## 2016-01-05 LAB — HEPATITIS C ANTIBODY: HCV AB: NEGATIVE

## 2016-01-05 MED ORDER — OLMESARTAN MEDOXOMIL-HCTZ 20-12.5 MG PO TABS: 1.0000 | ORAL_TABLET | Freq: Every day | ORAL | Status: DC

## 2016-01-05 MED ORDER — LEVOTHYROXINE SODIUM 88 MCG PO TABS: 88.0000 ug | ORAL_TABLET | Freq: Every day | ORAL | Status: DC

## 2016-01-05 MED ORDER — PANTOPRAZOLE SODIUM 40 MG PO TBEC: DELAYED_RELEASE_TABLET | ORAL | Status: DC

## 2016-01-05 NOTE — Addendum Note (Signed)
Addended by: Ronnald NianLALONDE, Onie Kasparek C on: 01/05/2016 11:46 AM   Modules accepted: Orders

## 2016-01-05 NOTE — Addendum Note (Signed)
Addended by: Ronnald NianLALONDE, JOHN C on: 01/05/2016 11:43 AM   Modules accepted: Orders

## 2016-01-11 ENCOUNTER — Other Ambulatory Visit: Payer: Self-pay | Admitting: Family Medicine

## 2016-02-04 ENCOUNTER — Ambulatory Visit (INDEPENDENT_AMBULATORY_CARE_PROVIDER_SITE_OTHER): Payer: Managed Care, Other (non HMO) | Admitting: Family Medicine

## 2016-02-04 ENCOUNTER — Encounter: Payer: Self-pay | Admitting: Family Medicine

## 2016-02-04 VITALS — BP 148/92 | HR 63 | Wt 235.0 lb

## 2016-02-04 DIAGNOSIS — M940 Chondrocostal junction syndrome [Tietze]: Secondary | ICD-10-CM

## 2016-02-04 DIAGNOSIS — I10 Essential (primary) hypertension: Secondary | ICD-10-CM

## 2016-02-04 MED ORDER — OLMESARTAN MEDOXOMIL-HCTZ 40-12.5 MG PO TABS: 1.0000 | ORAL_TABLET | Freq: Every day | ORAL | Status: DC

## 2016-02-04 NOTE — Patient Instructions (Signed)
Take 2 Aleve twice per day for the next week or so and you can also do heat to chest for 20 minutes 3 times per day 20 minutes and something every day

## 2016-02-04 NOTE — Progress Notes (Signed)
   Subjective:    Patient ID: Lisa [name], female    DOB: Jun 09, 1961, 55 y.o.   MRN: [id number]  HPI She is here for recheck on her blood pressure. She continues on medications listed in the chart. She has had no difficulty with that. She does complain of intermittent left-sided anterior pleuritic type chest pain. She states it is worse when she breathes and when she moves. No fever, chills, shortness of breath, diaphoresis or weakness.   Review of Systems     Objective:   Physical Exam Alert and in no distress. Cardiac exam shows regular rhythm without murmurs or gallops. Lungs are clear to auscultation. She does have minimal left-sided costochondral palpable pain around the third and fourth costochondral junctions       Assessment & Plan:  Essential hypertension - Plan: olmesartan-hydrochlorothiazide (BENICAR HCT) 40-12.5 MG tablet  Costochondritis I will increase her Benicar to 40/12.5. Also discussed treatment of costochondritis with anti-inflammatories. Reassured her that she was not in any danger and this had nothing to do with her heart.

## 2016-02-11 ENCOUNTER — Other Ambulatory Visit: Payer: Self-pay | Admitting: Family Medicine

## 2016-03-03 ENCOUNTER — Encounter: Payer: Self-pay | Admitting: Family Medicine

## 2016-03-03 ENCOUNTER — Ambulatory Visit (INDEPENDENT_AMBULATORY_CARE_PROVIDER_SITE_OTHER): Payer: Managed Care, Other (non HMO) | Admitting: Family Medicine

## 2016-03-03 VITALS — BP 126/70 | HR 54 | Wt 235.8 lb

## 2016-03-03 DIAGNOSIS — G479 Sleep disorder, unspecified: Secondary | ICD-10-CM

## 2016-03-03 DIAGNOSIS — I10 Essential (primary) hypertension: Secondary | ICD-10-CM

## 2016-03-03 DIAGNOSIS — G4720 Circadian rhythm sleep disorder, unspecified type: Secondary | ICD-10-CM

## 2016-03-03 MED ORDER — OLMESARTAN MEDOXOMIL-HCTZ 40-12.5 MG PO TABS: 1.0000 | ORAL_TABLET | Freq: Every day | ORAL | Status: DC

## 2016-03-03 MED ORDER — ZOLPIDEM TARTRATE 5 MG PO TABS: 5.0000 mg | ORAL_TABLET | Freq: Every evening | ORAL | Status: DC | PRN

## 2016-03-03 NOTE — Progress Notes (Signed)
   Subjective:    Patient ID: Lisa [name], female    DOB: 1960-11-09, 55 y.o.   MRN: [id number]  HPI  she is here for recheck. She is now on a new blood pressure medication and is having no difficulty on that. She now is working third shift and has noted difficulty with sleep disturbance. Has had difficulty with this in the past.   Review of Systems     Objective:   Physical Exam  alert and in no distress otherwise not examined       Assessment & Plan:  Essential hypertension - Plan: olmesartan-hydrochlorothiazide (BENICAR HCT) 40-12.5 MG tablet  Sleep pattern disturbance - Plan: zolpidem (AMBIEN) 5 MG tablet  continue on her present medication regimen. Also discussed the use of Ambien to help with shift work insomnia. Cautioned against use of this. I will closely monitor this.

## 2016-03-10 ENCOUNTER — Other Ambulatory Visit: Payer: Self-pay

## 2016-03-10 ENCOUNTER — Telehealth: Payer: Self-pay | Admitting: Internal Medicine

## 2016-03-10 DIAGNOSIS — I10 Essential (primary) hypertension: Secondary | ICD-10-CM

## 2016-03-10 MED ORDER — OLMESARTAN MEDOXOMIL-HCTZ 40-12.5 MG PO TABS: 1.0000 | ORAL_TABLET | Freq: Every day | ORAL | Status: DC

## 2016-03-10 NOTE — Telephone Encounter (Signed)
My notes say I gave him 90 pills. The pharmacy screwed up

## 2016-03-10 NOTE — Telephone Encounter (Signed)
Fax came over stating you refilled hctz/olmesartan for only 9 pills. Did you mean to send only 9. Please resend if you did not

## 2016-03-10 NOTE — Telephone Encounter (Signed)
No Dr.Lalonde she had already called me and I fixed it

## 2016-06-02 ENCOUNTER — Encounter: Payer: Self-pay | Admitting: Family Medicine

## 2016-06-02 ENCOUNTER — Ambulatory Visit (INDEPENDENT_AMBULATORY_CARE_PROVIDER_SITE_OTHER): Payer: Managed Care, Other (non HMO) | Admitting: Family Medicine

## 2016-06-02 VITALS — BP 130/84 | HR 64 | Temp 98.2°F | Wt 235.6 lb

## 2016-06-02 DIAGNOSIS — M545 Low back pain, unspecified: Secondary | ICD-10-CM

## 2016-06-02 DIAGNOSIS — R252 Cramp and spasm: Secondary | ICD-10-CM

## 2016-06-02 DIAGNOSIS — G2581 Restless legs syndrome: Secondary | ICD-10-CM

## 2016-06-02 DIAGNOSIS — S161XXA Strain of muscle, fascia and tendon at neck level, initial encounter: Secondary | ICD-10-CM | POA: Diagnosis not present

## 2016-06-02 LAB — CBC WITH DIFFERENTIAL/PLATELET
BASOS ABS: 0 {cells}/uL (ref 0–200)
BASOS PCT: 0 %
EOS PCT: 2 %
Eosinophils Absolute: 134 cells/uL (ref 15–500)
HCT: 40.5 % (ref 35.0–45.0)
Hemoglobin: 13.3 g/dL (ref 11.7–15.5)
Lymphs Abs: 2345 cells/uL (ref 850–3900)
MCH: 29 pg (ref 27.0–33.0)
MCHC: 32.8 g/dL (ref 32.0–36.0)
MCV: 88.4 fL (ref 80.0–100.0)
MPV: 11.9 fL (ref 7.5–12.5)
Monocytes Absolute: 402 cells/uL (ref 200–950)
Monocytes Relative: 6 %
NEUTROS PCT: 57 %
Neutro Abs: 3819 cells/uL (ref 1500–7800)
Platelets: 248 10*3/uL (ref 140–400)
RBC: 4.58 MIL/uL (ref 3.80–5.10)
RDW: 14.7 % (ref 11.0–15.0)
WBC: 6.7 10*3/uL (ref 4.0–10.5)

## 2016-06-02 MED ORDER — ROPINIROLE HCL 0.5 MG PO TABS: 0.5000 mg | ORAL_TABLET | Freq: Every evening | ORAL | 0 refills | Status: DC | PRN

## 2016-06-02 NOTE — Patient Instructions (Addendum)
Stay well hydrated, do the lower leg stretches we discussed before going to bed. Try wearing the compression stockings again since this helped in the past. Move around at work as often as you can instead of standing in one place.   I refilled your requip for your restless leg issues, follow up with Dr. Susann GivensLalonde if this is not helping.   Take advil or ibuprofen with food for muscle aches and use heat to the area 15-20 minutes at a time. Follow up in 2 weeks to make sure you are getting better.   We will call with lab results.   Follow up in 2 weeks for neck and back muscle aches.   Leg Cramps Leg cramps occur when a muscle or muscles tighten and you have no control over this tightening (involuntary muscle contraction). Muscle cramps can develop in any muscle, but the most common place is in the calf muscles of the leg. Those cramps can occur during exercise or when you are at rest. Leg cramps are painful, and they may last for a few seconds to a few minutes. Cramps may return several times before they finally stop. Usually, leg cramps are not caused by a serious medical problem. In many cases, the cause is not known. Some common causes include:  Overexertion.  Overuse from repetitive motions, or doing the same thing over and over.  Remaining in a certain position for a long period of time.  Improper preparation, form, or technique while performing a sport or an activity.  Dehydration.  Injury.  Side effects of some medicines.  Abnormally low levels of the salts and ions in your blood (electrolytes), especially potassium and calcium. These levels could be low if you are taking water pills (diuretics) or if you are pregnant. HOME CARE INSTRUCTIONS Watch your condition for any changes. Taking the following actions may help to lessen any discomfort that you are feeling:  Stay well-hydrated. Drink enough fluid to keep your urine clear or pale yellow.  Try massaging, stretching, and  relaxing the affected muscle. Do this for several minutes at a time.  For tight or tense muscles, use a warm towel, heating pad, or hot shower water directed to the affected area.  If you are sore or have pain after a cramp, applying ice to the affected area may relieve discomfort.  Put ice in a plastic bag.  Place a towel between your skin and the bag.  Leave the ice on for 20 minutes, 2-3 times per day.  Avoid strenuous exercise for several days if you have been having frequent leg cramps.  Make sure that your diet includes the essential minerals for your muscles to work normally.  Take medicines only as directed by your health care provider. SEEK MEDICAL CARE IF:  Your leg cramps get more severe or more frequent, or they do not improve over time.  Your foot becomes cold, numb, or blue.   This information is not intended to replace advice given to you by your health care provider. Make sure you discuss any questions you have with your health care provider.   Document Released: 11/13/2004 Document Revised: 02/20/2015 Document Reviewed: 09/13/2014 Elsevier Interactive Patient Education [employer]2016 [employer] Inc[employer]
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
## 2016-06-03 LAB — COMPREHENSIVE METABOLIC PANEL
ALBUMIN: 4.2 g/dL (ref 3.6–5.1)
ALK PHOS: 58 U/L (ref 33–130)
ALT: 18 U/L (ref 6–29)
AST: 17 U/L (ref 10–35)
BILIRUBIN TOTAL: 0.4 mg/dL (ref 0.2–1.2)
BUN: 17 mg/dL (ref 7–25)
CALCIUM: 9.6 mg/dL (ref 8.6–10.4)
CO2: 27 mmol/L (ref 20–31)
Chloride: 108 mmol/L (ref 98–110)
Creat: 1.02 mg/dL (ref 0.50–1.05)
GLUCOSE: 95 mg/dL (ref 65–99)
POTASSIUM: 4.5 mmol/L (ref 3.5–5.3)
Sodium: 143 mmol/L (ref 135–146)
Total Protein: 7 g/dL (ref 6.1–8.1)

## 2016-06-03 LAB — MAGNESIUM: Magnesium: 2 mg/dL (ref 1.5–2.5)

## 2016-06-03 LAB — TSH: TSH: 3.5 m[IU]/L

## 2016-06-18 ENCOUNTER — Encounter: Payer: Self-pay | Admitting: Family Medicine

## 2016-06-18 ENCOUNTER — Ambulatory Visit (INDEPENDENT_AMBULATORY_CARE_PROVIDER_SITE_OTHER): Payer: Managed Care, Other (non HMO) | Admitting: Family Medicine

## 2016-06-18 ENCOUNTER — Ambulatory Visit
Admission: RE | Admit: 2016-06-18 | Discharge: 2016-06-18 | Disposition: A | Discharge status: Home / Self Care | Payer: Managed Care, Other (non HMO) | Source: Ambulatory Visit | Attending: Family Medicine | Admitting: Family Medicine

## 2016-06-18 VITALS — BP 130/90 | HR 54 | Wt 239.2 lb

## 2016-06-18 DIAGNOSIS — M25512 Pain in left shoulder: Secondary | ICD-10-CM | POA: Diagnosis not present
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
## 2016-06-18 NOTE — Patient Instructions (Addendum)
Use heat to your neck and shoulder. If your shoulder XR is normal then I recommend coming in for a possible injection by Dr. Susann GivensLalonde.  You can continue taking anti-inflammatories but not more than recommended.

## 2016-06-24 ENCOUNTER — Ambulatory Visit (INDEPENDENT_AMBULATORY_CARE_PROVIDER_SITE_OTHER): Payer: Managed Care, Other (non HMO) | Admitting: Family Medicine

## 2016-06-24 ENCOUNTER — Encounter: Payer: Self-pay | Admitting: Family Medicine

## 2016-06-24 ENCOUNTER — Other Ambulatory Visit: Payer: Self-pay | Admitting: Family Medicine

## 2016-06-24 VITALS — BP 124/80 | HR 65 | Wt 239.0 lb

## 2016-06-24 DIAGNOSIS — Z23 Encounter for immunization: Secondary | ICD-10-CM | POA: Diagnosis not present

## 2016-06-24 DIAGNOSIS — S161XXA Strain of muscle, fascia and tendon at neck level, initial encounter: Secondary | ICD-10-CM

## 2016-06-24 DIAGNOSIS — Z1231 Encounter for screening mammogram for malignant neoplasm of breast: Secondary | ICD-10-CM

## 2016-06-24 DIAGNOSIS — M7552 Bursitis of left shoulder: Secondary | ICD-10-CM | POA: Diagnosis not present

## 2016-06-24 MED ORDER — TRIAMCINOLONE ACETONIDE 40 MG/ML IJ SUSP
40.0000 mg | Freq: Once | INTRAMUSCULAR | Status: AC
  Administered 2016-06-24: 40 mg via INTRAMUSCULAR

## 2016-06-24 MED ORDER — LIDOCAINE HCL 1 % IJ SOLN
3.0000 mL | Freq: Once | INTRAMUSCULAR | Status: AC
  Administered 2016-06-24: 3 mL via INTRADERMAL

## 2016-06-24 NOTE — Patient Instructions (Signed)
Keep doing the heat and stretching and range of motion for your back,
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
## 2016-06-24 NOTE — Addendum Note (Signed)
Addended by: Lavell IslamHOTON, Denman Pichardo M on: 06/24/2016 04:14 PM   Modules accepted: Orders

## 2016-06-30 ENCOUNTER — Other Ambulatory Visit: Payer: Self-pay | Admitting: Family Medicine

## 2016-06-30 DIAGNOSIS — G2581 Restless legs syndrome: Secondary | ICD-10-CM

## 2016-07-31 ENCOUNTER — Ambulatory Visit
Admission: RE | Admit: 2016-07-31 | Discharge: 2016-07-31 | Disposition: A | Discharge status: Home / Self Care | Payer: Managed Care, Other (non HMO) | Source: Ambulatory Visit | Attending: Family Medicine | Admitting: Family Medicine

## 2016-07-31 DIAGNOSIS — Z1231 Encounter for screening mammogram for malignant neoplasm of breast: Secondary | ICD-10-CM

## 2016-09-08 ENCOUNTER — Encounter: Payer: Self-pay | Admitting: Family Medicine

## 2016-09-08 ENCOUNTER — Ambulatory Visit (INDEPENDENT_AMBULATORY_CARE_PROVIDER_SITE_OTHER): Payer: Managed Care, Other (non HMO) | Admitting: Family Medicine

## 2016-09-08 VITALS — BP 128/86 | HR 68 | Ht 67.0 in | Wt 237.0 lb

## 2016-09-08 DIAGNOSIS — Z6837 Body mass index (BMI) 37.0-37.9, adult: Secondary | ICD-10-CM | POA: Diagnosis not present

## 2016-09-08 DIAGNOSIS — E6609 Other obesity due to excess calories: Secondary | ICD-10-CM

## 2016-09-08 DIAGNOSIS — E039 Hypothyroidism, unspecified: Secondary | ICD-10-CM

## 2016-09-08 DIAGNOSIS — I10 Essential (primary) hypertension: Secondary | ICD-10-CM

## 2016-09-08 DIAGNOSIS — M25512 Pain in left shoulder: Secondary | ICD-10-CM

## 2016-09-08 DIAGNOSIS — G8929 Other chronic pain: Secondary | ICD-10-CM

## 2016-09-08 LAB — POCT GLYCOSYLATED HEMOGLOBIN (HGB A1C): HEMOGLOBIN A1C: 5.6

## 2016-09-08 NOTE — Progress Notes (Signed)
Subjective

## 2016-09-08 NOTE — Progress Notes (Signed)
   Subjective:    Patient ID: Lisa [name], female    DOB: Dec 21, 1960, 55 y.o.   MRN: [id number]  HPI She is here for follow-up evaluation. She did obtain about 45 days worth of benefit out of the injection and now notes increased difficulty especially with abduction. She has been using a topical medication on this. She continues on her blood pressure medications and is having no difficulty with that. Her reflux is under good control. She continues on her thyroid medication. She states that her work keeps her quite busy interfering with her physical activities.   Review of Systems     Objective:   Physical Exam A1c is 5.6 Alert and in no distress. Full motion of the shoulder pain with abduction. Drop arm test negative. Negative sulcus sign. Neer's and Hawkins test was negative. No tenderness to palpation noted.      Assessment & Plan:  Chronic left shoulder pain  Essential hypertension  Hypothyroidism, unspecified type  Class 2 obesity due to excess calories without serious comorbidity with body mass index (BMI) of 37.0 to 37.9 in adult Continue on her present medications. Encouraged her to make dietary changes specifically cutting back on carbohydrates. She continues have difficulty with left shoulder pain with minimal response to steroids, I will refer to Dr. Katrinka BlazingSmith for further evaluation and possible ultrasound.

## 2016-09-08 NOTE — Addendum Note (Signed)
Addended by: Lavell IslamHOTON, Coden Franchi M on: 09/08/2016 09:18 AM   Modules accepted: Orders

## 2016-09-23 ENCOUNTER — Ambulatory Visit: Payer: Managed Care, Other (non HMO) | Admitting: Family Medicine

## 2016-09-25 ENCOUNTER — Ambulatory Visit: Payer: Managed Care, Other (non HMO) | Admitting: Family Medicine

## 2016-10-15 NOTE — Progress Notes (Signed)
Tawana ScaleZach Burlie [name] D.O. [facility] [address] [place], [place] [address] Phone: [phone] Subjective:    I'm seeing this patient by the request  of:  Carollee HerterLALONDE,JOHN CHARLES, MD   CC: Left shoulder pain  OZH:[id number]:Subjective  Lisa HeySharon E [name] is a 55 y.o. female coming in with complaint of left shoulder pain. Patient was given an injection by primary care provider approximately 2 months ago. Patient was having increasing pain and was sent here for further evaluation. Patient states unfortunately she did not make any significant improvement. Seems to be worsening a little bit. An states that it as a sharp pain with certain movements. Rates the severity of 8 out of 10. At rest is have a dull throbbing aching pain. Denies any weakness. No significant radiation down to the hand but sometimes feels that her left elbow can hurt as well. Sometimes can wake her up at night. Not responding over-the-counter medications.    Patient did have injection of left shoulder. These were individually visualized by me. 06/18/2016 patient's left shoulder x-rays show no significant bony abnormality. Very small subacromial spur otherwise completely unremarkable.  Past Medical History:  Diagnosis Date  . Anemia   . Esophageal reflux   . Essential hypertension, benign   . Hyperlipidemia   . Impaired fasting glucose    02/2010--fglu 126, A1c 6.2  . Low back pain    Dr. Noel Geroldohen  . Obesity, unspecified   . Pneumonia    years ago   . Restless leg syndrome   . Sarcoidosis (HCC)    pulmonary and hepatic; followed by Dr. Sherene SiresWert  . Unspecified hypothyroidism   . Unspecified vitamin D deficiency    resolved   Past Surgical History:  Procedure Laterality Date  . ABDOMINAL HYSTERECTOMY  03/2005   Dr. Earlene Plateravis  . TOTAL HIP ARTHROPLASTY Left 11/14/2014   Procedure: LEFT TOTAL HIP ARTHROPLASTY ANTERIOR APPROACH;  Surgeon: Kathryne Hitchhristopher Y Blackman, MD;  Location: [facility] OR;  Service: Orthopedics;  Laterality: Left;   Social  History   Social History  . Marital status: Married    Spouse name: N/A  . Number of children: 1  . Years of education: N/A   Occupational History  . works at [employer], and [employer] student   .  Unemployed   Social History Main Topics  . Smoking status: Former Smoker    Packs/day: 0.50    Years: 20.00    Types: Cigarettes    Quit date: 10/20/2004  . Smokeless tobacco: Never Used  . Alcohol use Yes     Comment: occasional  . Drug use: No  . Sexual activity: Not Currently   Other Topics Concern  . None   Social History Narrative   Lives with husband, and 1 dog   Allergies  Allergen Reactions  . Bupropion Hcl Hives   Family History  Problem Relation Age of Onset  . Cancer Mother     bladder  . Cancer Father [id number]    colon cancer  . Diabetes Maternal Grandfather     Past medical history, social, surgical and family history all reviewed in electronic medical record.  No pertanent information unless stated regarding to the chief complaint.   Review of Systems:Review of systems updated and as accurate as of 10/16/16  No headache, visual changes, nausea, vomiting, diarrhea, constipation, dizziness, abdominal pain, skin rash, fevers, chills, night sweats, weight loss, swollen lymph nodes, body aches,chest pain, shortness of breath, mood changes.   Objective  Blood pressure 132/86, pulse Marland Kitchen(!)  59, height 5\' 7"  (1.702 m), weight 238 lb (108 kg), SpO2 97 %. Systems examined below as of 10/16/16   General: No apparent distress alert and oriented x3 mood and affect normal, dressed appropriately.  HEENT: Pupils equal, extraocular movements intact  Respiratory: Patient's speak in full sentences and does not appear short of breath  Cardiovascular: No lower extremity edema, non tender, no erythema  Skin: Warm dry intact with no signs of infection or rash on extremities or on axial skeleton.  Abdomen: Soft nontender  Neuro: Cranial nerves II through XII are intact, neurovascularly  intact in all extremities with 2+ DTRs and 2+ pulses.  Lymph: No lymphadenopathy of posterior or anterior cervical chain or axillae bilaterally.  Gait normal with good balance and coordination.  MSK:  Non tender with full range of motion and good stability and symmetric strength and tone of  elbows, wrist, hip, knee and ankles bilaterally.   Shoulder: left Inspection reveals no abnormalities, atrophy or asymmetry. Palpation is normal with no tenderness over AC joint or bicipital groove. ROM is full in all planes passively. Rotator cuff strength normal throughout. signs of impingement with positive Neer and Hawkin's tests, but negative empty can sign. Speeds and Yergason's tests normal. No labral pathology noted with negative Obrien's, negative clunk and good stability. Normal scapular function observed. No painful arc and no drop arm sign. No apprehension sign  MSK US performed of: left This study was ordered, performed, and interpreted by Terrilee FilesZach Samreet Edenfield D.O.  Shoulder:   Supraspinatus:  Appears normal on long and transverse views, Bursal bulge seen with shoulder abduction on impingement view. Infraspinatus:  Appears normal on long and transverse views. Significant increase in Doppler flow Subscapularis:  Patient does have a large calcific bursitis noted. This could be also a rheumatoid nodule. Patient does have increasing Doppler flow to the area. No true tear appreciated. Patient does have some mild underlying erosive bone changes noted Teres Minor:  Appears normal on long and transverse views. AC joint:  Capsule undistended, no geyser sign. Glenohumeral Joint:  Appears normal without effusion. Glenoid Labrum:  Intact without visualized tears. Biceps Tendon:  Appears normal on long and transverse views, no fraying of tendon, tendon located in intertubercular groove, no subluxation with shoulder internal or external rotation.  Impression: Calcific bursitis with possible erosive changes to  the bone     Impression and Recommendations:     This case required medical decision making of moderate complexity.      Note: This dictation was prepared with Dragon dictation along with smaller phrase technology. Any transcriptional errors that result from this process are unintentional.

## 2016-10-16 ENCOUNTER — Ambulatory Visit (INDEPENDENT_AMBULATORY_CARE_PROVIDER_SITE_OTHER): Payer: Managed Care, Other (non HMO) | Admitting: Family Medicine

## 2016-10-16 ENCOUNTER — Ambulatory Visit: Payer: Self-pay

## 2016-10-16 ENCOUNTER — Encounter: Payer: Self-pay | Admitting: Family Medicine

## 2016-10-16 VITALS — BP 132/86 | HR 59 | Ht 67.0 in | Wt 238.0 lb

## 2016-10-16 DIAGNOSIS — M25512 Pain in left shoulder: Secondary | ICD-10-CM | POA: Diagnosis not present

## 2016-10-16 DIAGNOSIS — M7552 Bursitis of left shoulder: Secondary | ICD-10-CM | POA: Diagnosis not present

## 2016-10-16 DIAGNOSIS — M7530 Calcific tendinitis of unspecified shoulder: Secondary | ICD-10-CM | POA: Insufficient documentation

## 2016-10-16 DIAGNOSIS — Z862 Personal history of diseases of the blood and blood-forming organs and certain disorders involving the immune mechanism: Secondary | ICD-10-CM

## 2016-10-16 NOTE — Patient Instructions (Signed)
Good to see you  Physical therapy will be calling you  Decrease your calcium supplements Start vitamin D 1000-2000 IU daily  pennsaid pinkie amount topically 2 times daily as needed.  Exercises 3 times a week.  Turmeric 500mg  1-2 times daily can help with inflammation  See me again in 4-5 weeks and if not better we will do injection again

## 2016-10-16 NOTE — Assessment & Plan Note (Signed)
Patient does have more of a calcific bursitis of the left shoulder. I do believe that this could be secondary to her sarcoidosis. Patient has been diagnosed with this. We discussed vitamin D supplementation and decreasing calcium. We discussed topical anti-inflammatories and given trial. We discussed monitoring for any other signs and symptoms of a sarcoidosis flare. We'll start with formal physical therapy to help with range of motion but patient has good strength. No true tear appreciated. We discussed we'll have patient try conservative therapy for 5-6 weeks and then follow-up. At that time if worsening symptoms we could attempt another injection.

## 2016-10-31 ENCOUNTER — Ambulatory Visit: Payer: Managed Care, Other (non HMO) | Attending: Family Medicine | Admitting: Physical Therapy

## 2016-10-31 ENCOUNTER — Encounter: Payer: Self-pay | Admitting: Physical Therapy

## 2016-10-31 DIAGNOSIS — M25612 Stiffness of left shoulder, not elsewhere classified: Secondary | ICD-10-CM | POA: Diagnosis present

## 2016-10-31 DIAGNOSIS — M6281 Muscle weakness (generalized): Secondary | ICD-10-CM

## 2016-10-31 DIAGNOSIS — M62838 Other muscle spasm: Secondary | ICD-10-CM

## 2016-10-31 DIAGNOSIS — M25512 Pain in left shoulder: Secondary | ICD-10-CM | POA: Diagnosis not present
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
## 2016-11-07 ENCOUNTER — Ambulatory Visit: Payer: Managed Care, Other (non HMO) | Admitting: Physical Therapy

## 2016-11-10 ENCOUNTER — Ambulatory Visit: Payer: Managed Care, Other (non HMO) | Admitting: Physical Therapy

## 2016-11-10 ENCOUNTER — Encounter: Payer: Self-pay | Admitting: Physical Therapy

## 2016-11-10 DIAGNOSIS — M25512 Pain in left shoulder: Secondary | ICD-10-CM | POA: Diagnosis not present

## 2016-11-10 DIAGNOSIS — M25612 Stiffness of left shoulder, not elsewhere classified: Secondary | ICD-10-CM

## 2016-11-10 DIAGNOSIS — M6281 Muscle weakness (generalized): Secondary | ICD-10-CM

## 2016-11-10 DIAGNOSIS — M62838 Other muscle spasm: Secondary | ICD-10-CM
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
## 2016-11-14 ENCOUNTER — Ambulatory Visit: Payer: Managed Care, Other (non HMO) | Admitting: Physical Therapy

## 2016-11-17 ENCOUNTER — Ambulatory Visit: Payer: Managed Care, Other (non HMO) | Admitting: Physical Therapy

## 2016-11-17 ENCOUNTER — Encounter: Payer: Self-pay | Admitting: Physical Therapy

## 2016-11-17 DIAGNOSIS — M6281 Muscle weakness (generalized): Secondary | ICD-10-CM

## 2016-11-17 DIAGNOSIS — M62838 Other muscle spasm: Secondary | ICD-10-CM

## 2016-11-17 DIAGNOSIS — M25612 Stiffness of left shoulder, not elsewhere classified: Secondary | ICD-10-CM

## 2016-11-17 DIAGNOSIS — M25512 Pain in left shoulder: Secondary | ICD-10-CM | POA: Diagnosis not present
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
## 2016-11-19 NOTE — Progress Notes (Signed)
Lisa [name] D.O. Lisa [name] [address] [place], [place] [address] Phone: [phone] Subjective:    I'm seeing this patient by the request  of:  Lisa HerterLALONDE,Lisa CHARLES, MD   CC: Left shoulder pain f/u  BJY:[id number]:Subjective  Lisa HeySharon E [name] is a 56 y.o. female coming in with complaint of left shoulder pain. Patient was seen one month ago and diagnosed with more of a subacromial bursitis versus erosive changes secondary to circumflex and doses. Patient was to try and icing therapy and did start with formal physical therapy.Patient states that she is doing approximately 75% better. Not having any significant pain at this time. No more catching. Can do daily activities like overhead again. Very happy with the results.    Patient did have injection of left shoulder. These were individually visualized by me. 06/18/2016 patient's left shoulder x-rays show no significant bony abnormality. Very small subacromial spur otherwise completely unremarkable.   Past Medical History:  Diagnosis Date  . Anemia   . Esophageal reflux   . Essential hypertension, benign   . Hyperlipidemia   . Impaired fasting glucose    02/2010--fglu 126, A1c 6.2  . Low back pain    Dr. Noel Geroldohen  . Obesity, unspecified   . Pneumonia    years ago   . Restless leg syndrome   . Sarcoidosis (HCC)    pulmonary and hepatic; followed by Dr. Sherene SiresWert  . Unspecified hypothyroidism   . Unspecified vitamin D deficiency    resolved   Past Surgical History:  Procedure Laterality Date  . ABDOMINAL HYSTERECTOMY  03/2005   Dr. Earlene Plateravis  . TOTAL HIP ARTHROPLASTY Left 11/14/2014   Procedure: LEFT TOTAL HIP ARTHROPLASTY ANTERIOR APPROACH;  Surgeon: Kathryne Hitchhristopher Y Blackman, MD;  Location: [facility] OR;  Service: Orthopedics;  Laterality: Left;   Social History   Social History  . Marital status: Married    Spouse name: N/A  . Number of children: 1  . Years of education: N/A   Occupational History  . works at [employer], and [employer]  student   .  Unemployed   Social History Main Topics  . Smoking status: Former Smoker    Packs/day: 0.50    Years: 20.00    Types: Cigarettes    Quit date: 10/20/2004  . Smokeless tobacco: Never Used  . Alcohol use Yes     Comment: occasional  . Drug use: No  . Sexual activity: Not Currently   Other Topics Concern  . None   Social History Narrative   Lives with husband, and 1 dog   Allergies  Allergen Reactions  . Bupropion Hcl Hives   Family History  Problem Relation Age of Onset  . Cancer Mother     bladder  . Cancer Father [id number]    colon cancer  . Diabetes Maternal Grandfather     Past medical history, social, surgical and family history all reviewed in electronic medical record.  No pertanent information unless stated regarding to the chief complaint.   Review of Systems: No headache, visual changes, nausea, vomiting, diarrhea, constipation, dizziness, abdominal pain, skin rash, fevers, chills, night sweats, weight loss, swollen lymph nodes, body aches, joint swelling, muscle aches, chest pain, shortness of breath, mood changes.    Objective  Blood pressure 108/78, pulse 62, height 5\' 7"  (1.702 m), weight 231 lb (104.8 kg), SpO2 99 %. Systems examined below as of 11/20/16   Systems examined below as of 11/20/16 General: NAD A&O x3 mood, affect normal  HEENT:  Pupils equal, extraocular movements intact no nystagmus Respiratory: not short of breath at rest or with speaking Cardiovascular: No lower extremity edema, non tender Skin: Warm dry intact with no signs of infection or rash on extremities or on axial skeleton. Abdomen: Soft nontender, no masses Neuro: Cranial nerves  intact, neurovascularly intact in all extremities with 2+ DTRs and 2+ pulses. Lymph: No lymphadenopathy appreciated today  Gait normal with good balance and coordination.  MSK: Non tender with full range of motion and good stability and symmetric strength and tone of  elbows, wrist,  knee hips and  ankles bilaterally.    Shoulder: left Inspection reveals no abnormalities, atrophy or asymmetry. Palpation is normal with no tenderness over AC joint or bicipital groove. ROM is full in all planes. Rotator cuff strength normal throughout. Minimal impingement still remaining.  Speeds and Yergason's tests normal. No labral pathology noted with negative Obrien's, negative clunk and good stability. Normal scapular function observed. No painful arc and no drop arm sign. No apprehension sign     Impression and Recommendations:     This case required medical decision making of moderate complexity.      Note: This dictation was prepared with Dragon dictation along with smaller phrase technology. Any transcriptional errors that result from this process are unintentional.

## 2016-11-20 ENCOUNTER — Encounter: Payer: Self-pay | Admitting: Family Medicine

## 2016-11-20 ENCOUNTER — Ambulatory Visit (INDEPENDENT_AMBULATORY_CARE_PROVIDER_SITE_OTHER): Payer: Managed Care, Other (non HMO) | Admitting: Family Medicine

## 2016-11-20 DIAGNOSIS — M7530 Calcific tendinitis of unspecified shoulder: Secondary | ICD-10-CM | POA: Diagnosis not present

## 2016-11-20 NOTE — Patient Instructions (Signed)
God to see you  Lisa [name] is your friend after activity  Stay active.  Up to you on physical therapy  [employer] "total support" online would be great  See me again in 6 weeks if the shoulder or the ankle is still not perfect!

## 2016-11-20 NOTE — Assessment & Plan Note (Signed)
Improved with PT.  No real changes, continue current therapy.  RTC in 6 weeks to make sure doing well.

## 2016-11-21 ENCOUNTER — Ambulatory Visit: Payer: Managed Care, Other (non HMO) | Admitting: Physical Therapy

## 2016-11-24 ENCOUNTER — Ambulatory Visit: Payer: Managed Care, Other (non HMO) | Attending: Family Medicine | Admitting: Physical Therapy

## 2016-11-24 ENCOUNTER — Encounter: Payer: Self-pay | Admitting: Physical Therapy

## 2016-11-24 DIAGNOSIS — M6281 Muscle weakness (generalized): Secondary | ICD-10-CM | POA: Insufficient documentation

## 2016-11-24 DIAGNOSIS — M62838 Other muscle spasm: Secondary | ICD-10-CM | POA: Diagnosis present

## 2016-11-24 DIAGNOSIS — M25512 Pain in left shoulder: Secondary | ICD-10-CM | POA: Diagnosis not present

## 2016-11-24 DIAGNOSIS — M25612 Stiffness of left shoulder, not elsewhere classified: Secondary | ICD-10-CM | POA: Diagnosis present
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
## 2016-11-28 ENCOUNTER — Encounter: Payer: Self-pay | Admitting: Physical Therapy

## 2016-11-28 ENCOUNTER — Ambulatory Visit: Payer: Managed Care, Other (non HMO) | Admitting: Physical Therapy

## 2016-11-28 DIAGNOSIS — M25512 Pain in left shoulder: Secondary | ICD-10-CM | POA: Diagnosis not present

## 2016-11-28 DIAGNOSIS — M25612 Stiffness of left shoulder, not elsewhere classified: Secondary | ICD-10-CM

## 2016-11-28 DIAGNOSIS — M62838 Other muscle spasm: Secondary | ICD-10-CM

## 2016-11-28 DIAGNOSIS — M6281 Muscle weakness (generalized): Secondary | ICD-10-CM
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
## 2016-12-28 ENCOUNTER — Other Ambulatory Visit: Payer: Self-pay | Admitting: Family Medicine

## 2016-12-28 DIAGNOSIS — K219 Gastro-esophageal reflux disease without esophagitis: Secondary | ICD-10-CM

## 2017-01-01 ENCOUNTER — Ambulatory Visit: Payer: Managed Care, Other (non HMO) | Admitting: Family Medicine

## 2017-01-07 NOTE — Progress Notes (Signed)
[name] [address] [place], [place] [address] Phone: [phone] Subjective:    I'm seeing this patient by the request  of:  Carollee Herter, MD   CC: Left shoulder pain f/u  [id number]  Lisa [name] is a 56 y.o. female coming in with complaint of left shoulder pain. Patient was seen one month ago and diagnosed with more of a subacromial bursitis versus erosive changes secondary to circumflex and doses. Patient was to try and icing therapy and did start with formal physical therapy.Patient at last exam was doing approximately 75% better. Patient was to continue to increase activity. Patient states doing well.   Left foot pain. Patient states that it is more on the medial aspect the left foot. Patient states that gives her trouble throughout the day. Describes it as an aching pain. Radiates into the foot. Denies any calf pain. Denies any swelling. States though that can be sore even to the touch. Has tried icing with some mild improvement. Does not rib number any true injury.     Patient did have xrayof left shoulder. These were individually visualized by me. 06/18/2016 patient's left shoulder x-rays show no significant bony abnormality. Very small subacromial spur otherwise completely unremarkable.    Past Medical History:  Diagnosis Date  . Anemia   . Esophageal reflux   . Essential hypertension, benign   . Hyperlipidemia   . Impaired fasting glucose    02/2010--fglu 126, A1c 6.2  . Low back pain    Dr. Noel Gerold  . Obesity, unspecified   . Pneumonia    years ago   . Restless leg syndrome   . Sarcoidosis (HCC)    pulmonary and hepatic; followed by Dr. Sherene Sires  . Unspecified hypothyroidism   . Unspecified vitamin D deficiency    resolved   Past Surgical History:  Procedure Laterality Date  . ABDOMINAL HYSTERECTOMY  03/2005   Dr. Earlene Plater  . TOTAL HIP ARTHROPLASTY Left 11/14/2014   Procedure: LEFT TOTAL HIP ARTHROPLASTY ANTERIOR APPROACH;   Surgeon: Kathryne Hitch, MD;  Location: [facility] OR;  Service: Orthopedics;  Laterality: Left;   Social History   Social History  . Marital status: Married    Spouse name: N/A  . Number of children: 1  . Years of education: N/A   Occupational History  . works at [employer], and [employer]   .  Unemployed   Social History Main Topics  . Smoking status: Former Smoker    Packs/day: 0.50    Years: 20.00    Types: Cigarettes    Quit date: 10/20/2004  . Smokeless tobacco: Never Used  . Alcohol use Yes     Comment: occasional  . Drug use: No  . Sexual activity: Not Currently   Other Topics Concern  . None   Social History Narrative   Lives with husband, and 1 dog   Allergies  Allergen Reactions  . Bupropion Hcl Hives   Family History  Problem Relation Age of Onset  . Cancer Mother     bladder  . Cancer Father 44    colon cancer  . Diabetes Maternal Grandfather     Past medical history, social, surgical and family history all reviewed in electronic medical record.  No pertanent information unless stated regarding to the chief complaint.   Review of Systems: No headache, visual changes, nausea, vomiting, diarrhea, constipation, dizziness, abdominal pain, skin rash, fevers, chills, night sweats, weight loss, swollen lymph nodes, body aches,  joint swelling, muscle aches, chest pain, shortness of breath, mood changes.     Objective  Blood pressure 112/80, pulse 74, height 5\' 7"  (1.702 m), weight 230 lb 12.8 oz (104.7 kg), SpO2 94 %.   Systems examined below as of 01/08/17 General: NAD A&O x3 mood, affect normal  HEENT: Pupils equal, extraocular movements intact no nystagmus Respiratory: not short of breath at rest or with speaking Cardiovascular: No lower extremity edema, non tender Skin: Warm dry intact with no signs of infection or rash on extremities or on axial skeleton. Abdomen: Soft nontender, no masses Neuro: Cranial nerves  intact, neurovascularly  intact in all extremities with 2+ DTRs and 2+ pulses. Lymph: No lymphadenopathy appreciated today  Gait normal with good balance and coordination.  MSK: Non tender with full range of motion and good stability and symmetric strength and tone of , elbows, wrist,  knee hips bilaterally.    Shoulder: left  Inspection reveals no abnormalities, atrophy or asymmetry. Palpation is normal with no tenderness over AC joint or bicipital groove. ROM is full in all planes. Rotator cuff strength normal throughout. No signs of impingement with negative Neer and Hawkin's tests, empty can sign. Speeds and Yergason's tests normal. No labral pathology noted with negative Obrien's, negative clunk and good stability. Normal scapular function observed. No painful arc and no drop arm sign. No apprehension sign Contralateral shoulder unremarkable  Foot exam shows the patient does have severe pes planus bilaterally. 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 Over pronation of the hindfoot. Splaying between the first and second dose secondary to her down of the transverse arch. Tender to palpation over the posterior tibialis tendon.  Procedure note 97110; 15 minutes spent for Therapeutic exercises as stated in above notes.  This included exercises focusing on stretching, strengthening, with significant focus on eccentric aspects. Ankle strengthening that included:  Basic range of motion exercises to allow proper full motion at ankle Stretching of the lower leg and hamstrings  Theraband exercises for the lower leg - inversion, eversion, dorsiflexion and plantarflexion each to be completed with a theraband Balance exercises to increase proprioception Weight bearing exercises to increase strength and balance    Proper technique shown and discussed handout in great detail with ATC.  All questions were discussed and answered.       Impression and Recommendations:     This case  required medical decision making of moderate complexity.      Note: This dictation was prepared with Dragon dictation along with smaller phrase technology. Any transcriptional errors that result from this process are unintentional.

## 2017-01-08 ENCOUNTER — Encounter: Payer: Self-pay | Admitting: Family Medicine

## 2017-01-08 ENCOUNTER — Ambulatory Visit (INDEPENDENT_AMBULATORY_CARE_PROVIDER_SITE_OTHER): Payer: Managed Care, Other (non HMO) | Admitting: Family Medicine

## 2017-01-08 DIAGNOSIS — M76822 Posterior tibial tendinitis, left leg: Secondary | ICD-10-CM

## 2017-01-08 DIAGNOSIS — M2141 Flat foot [pes planus] (acquired), right foot: Secondary | ICD-10-CM | POA: Diagnosis not present

## 2017-01-08 DIAGNOSIS — M7530 Calcific tendinitis of unspecified shoulder: Secondary | ICD-10-CM | POA: Diagnosis not present

## 2017-01-08 DIAGNOSIS — M2142 Flat foot [pes planus] (acquired), left foot: Secondary | ICD-10-CM | POA: Diagnosis not present

## 2017-01-08 DIAGNOSIS — M2140 Flat foot [pes planus] (acquired), unspecified foot: Secondary | ICD-10-CM | POA: Insufficient documentation

## 2017-01-08 NOTE — Assessment & Plan Note (Signed)
Bilaterally but symptomatic on the left sign. Patient does have insufficiency of the posterior tibialis tendon. We discussed home exercises and patient work with Event organiserathletic trainer. We discussed icing regimen. We discussed over-the-counter orthotics in the possibility for custom orthotics. Patient declined formal physical therapy as well as injection. We'll get patient 6 weeks and at that point consider other treatment options if not improved.

## 2017-01-08 NOTE — Patient Instructions (Addendum)
Great to see you  Ice to the ankle 2 times a day  Stay active. Exercises 3 times a week.  Spenco orthotics "total support" online would be great check on amazon Avoid being barefoot.  pennsaid pinkie amount topically 2 times daily as needed.  If you want check with your insurance and see how much they cover for custom orthotics (L3030) See me again in 6 weeks.

## 2017-01-08 NOTE — Assessment & Plan Note (Signed)
Doing well after the injection. Did well with conservative therapy. Patient will follow-up with me more on an as-needed basis for this.

## 2017-01-08 NOTE — Assessment & Plan Note (Signed)
Home exercises given, discussed proper shoes, discussed rigid soles shoes, and over-the-counter orthotics. Topical anti-inflammatories given. Follow-up again in 6 weeks

## 2017-02-06 ENCOUNTER — Encounter: Payer: Managed Care, Other (non HMO) | Admitting: Family Medicine

## 2017-02-10 ENCOUNTER — Other Ambulatory Visit: Payer: Self-pay | Admitting: Family Medicine

## 2017-02-20 ENCOUNTER — Ambulatory Visit: Payer: Managed Care, Other (non HMO) | Admitting: Family Medicine

## 2017-03-18 ENCOUNTER — Ambulatory Visit: Payer: Managed Care, Other (non HMO) | Admitting: Family Medicine

## 2017-03-30 ENCOUNTER — Encounter: Payer: Self-pay | Admitting: Family Medicine

## 2017-03-30 ENCOUNTER — Ambulatory Visit (INDEPENDENT_AMBULATORY_CARE_PROVIDER_SITE_OTHER): Payer: Managed Care, Other (non HMO) | Admitting: Family Medicine

## 2017-03-30 VITALS — BP 120/70 | HR 54 | Ht 67.0 in | Wt 236.0 lb

## 2017-03-30 DIAGNOSIS — E785 Hyperlipidemia, unspecified: Secondary | ICD-10-CM

## 2017-03-30 DIAGNOSIS — G2581 Restless legs syndrome: Secondary | ICD-10-CM | POA: Diagnosis not present

## 2017-03-30 DIAGNOSIS — Z862 Personal history of diseases of the blood and blood-forming organs and certain disorders involving the immune mechanism: Secondary | ICD-10-CM

## 2017-03-30 DIAGNOSIS — I10 Essential (primary) hypertension: Secondary | ICD-10-CM | POA: Diagnosis not present

## 2017-03-30 DIAGNOSIS — Z0000 Encounter for general adult medical examination without abnormal findings: Secondary | ICD-10-CM | POA: Diagnosis not present

## 2017-03-30 DIAGNOSIS — E6609 Other obesity due to excess calories: Secondary | ICD-10-CM | POA: Diagnosis not present

## 2017-03-30 DIAGNOSIS — Z6837 Body mass index (BMI) 37.0-37.9, adult: Secondary | ICD-10-CM

## 2017-03-30 DIAGNOSIS — K219 Gastro-esophageal reflux disease without esophagitis: Secondary | ICD-10-CM | POA: Diagnosis not present

## 2017-03-30 DIAGNOSIS — E039 Hypothyroidism, unspecified: Secondary | ICD-10-CM

## 2017-03-30 DIAGNOSIS — J302 Other seasonal allergic rhinitis: Secondary | ICD-10-CM

## 2017-03-30 DIAGNOSIS — M76822 Posterior tibial tendinitis, left leg: Secondary | ICD-10-CM

## 2017-03-30 DIAGNOSIS — R7301 Impaired fasting glucose: Secondary | ICD-10-CM | POA: Diagnosis not present

## 2017-03-30 DIAGNOSIS — M25512 Pain in left shoulder: Secondary | ICD-10-CM

## 2017-03-30 DIAGNOSIS — E66812 Obesity, class 2: Secondary | ICD-10-CM

## 2017-03-30 LAB — CBC WITH DIFFERENTIAL/PLATELET
BASOS ABS: 0 {cells}/uL (ref 0–200)
Basophils Relative: 0 %
EOS ABS: 108 {cells}/uL (ref 15–500)
Eosinophils Relative: 2 %
HEMATOCRIT: 42.1 % (ref 35.0–45.0)
HEMOGLOBIN: 13.3 g/dL (ref 11.7–15.5)
LYMPHS ABS: 2106 {cells}/uL (ref 850–3900)
Lymphocytes Relative: 39 %
MCH: 28.2 pg (ref 27.0–33.0)
MCHC: 31.6 g/dL — ABNORMAL LOW (ref 32.0–36.0)
MCV: 89.2 fL (ref 80.0–100.0)
MPV: 11.4 fL (ref 7.5–12.5)
Monocytes Absolute: 378 cells/uL (ref 200–950)
Monocytes Relative: 7 %
NEUTROS ABS: 2808 {cells}/uL (ref 1500–7800)
NEUTROS PCT: 52 %
Platelets: 236 10*3/uL (ref 140–400)
RBC: 4.72 MIL/uL (ref 3.80–5.10)
RDW: 14.4 % (ref 11.0–15.0)
WBC: 5.4 10*3/uL (ref 4.0–10.5)

## 2017-03-30 LAB — COMPREHENSIVE METABOLIC PANEL
ALBUMIN: 4.1 g/dL (ref 3.6–5.1)
ALT: 23 U/L (ref 6–29)
AST: 21 U/L (ref 10–35)
Alkaline Phosphatase: 64 U/L (ref 33–130)
BUN: 14 mg/dL (ref 7–25)
CALCIUM: 9.1 mg/dL (ref 8.6–10.4)
CHLORIDE: 104 mmol/L (ref 98–110)
CO2: 27 mmol/L (ref 20–31)
CREATININE: 1.04 mg/dL (ref 0.50–1.05)
Glucose, Bld: 99 mg/dL (ref 65–99)
POTASSIUM: 4.1 mmol/L (ref 3.5–5.3)
Sodium: 140 mmol/L (ref 135–146)
TOTAL PROTEIN: 6.9 g/dL (ref 6.1–8.1)
Total Bilirubin: 0.4 mg/dL (ref 0.2–1.2)

## 2017-03-30 LAB — POCT URINALYSIS DIP (PROADVANTAGE DEVICE)
BILIRUBIN UA: NEGATIVE
BILIRUBIN UA: NEGATIVE mg/dL
Blood, UA: NEGATIVE
Glucose, UA: NEGATIVE mg/dL
LEUKOCYTES UA: NEGATIVE
Nitrite, UA: NEGATIVE
Protein Ur, POC: NEGATIVE mg/dL
Specific Gravity, Urine: 1.025
Urobilinogen, Ur: NEGATIVE
pH, UA: 6 (ref 5.0–8.0)

## 2017-03-30 LAB — LIPID PANEL
CHOL/HDL RATIO: 2.6 ratio (ref <5.0)
CHOLESTEROL: 135 mg/dL (ref <200)
HDL: 51 mg/dL (ref >50)
LDL Cholesterol: 69 mg/dL (ref <100)
TRIGLYCERIDES: 76 mg/dL (ref <150)
VLDL: 15 mg/dL (ref <30)

## 2017-03-30 LAB — TSH: TSH: 2.74 mIU/L

## 2017-03-30 MED ORDER — LEVOTHYROXINE SODIUM 88 MCG PO TABS: 88.0000 ug | ORAL_TABLET | Freq: Every day | ORAL | 1 refills | Status: DC

## 2017-03-30 MED ORDER — PANTOPRAZOLE SODIUM 40 MG PO TBEC: 40.0000 mg | DELAYED_RELEASE_TABLET | Freq: Every day | ORAL | 3 refills | Status: DC

## 2017-03-30 MED ORDER — OLMESARTAN MEDOXOMIL-HCTZ 40-12.5 MG PO TABS: 1.0000 | ORAL_TABLET | Freq: Every day | ORAL | 3 refills | Status: DC

## 2017-03-30 MED ORDER — ROPINIROLE HCL 0.5 MG PO TABS: ORAL_TABLET | ORAL | 1 refills | Status: DC

## 2017-03-30 MED ORDER — PRAVASTATIN SODIUM 40 MG PO TABS: 40.0000 mg | ORAL_TABLET | Freq: Every day | ORAL | 3 refills | Status: DC

## 2017-03-30 NOTE — Progress Notes (Signed)
Subjective:    Patient ID: Lisa [name], female    DOB: 01-05-1961, 56 y.o.   MRN: [id number]  HPI She is here for complete examination. She has remote history of sarcoid but presently is having no pulmonary type symptoms. She continues on almost losartan/HCTZ for her blood pressure. Her reflux symptoms seem to be under good control. She continues on Pravachol for her underlying hyperlipidemia and having no aches or pains with that. She is taking Protonix on a regular basis and using Pepcid on an as-needed basis although she is having no reflux type symptoms. She continues on Synthroid for her underlying hypothyroidism and is had no cold or heat intolerance. She does have RLS and intermittently is using her Requip. He continues to have intermittent shoulder pain and is doing rehabilitation on an as-needed basis. Her left foot again gives her intermittent difficulty and she has been seen in the past by sports medicine for that. Her history is significant for impaired glucose and she admits to not doing much in regard to her physical activities to help with this. She has had a hysterectomy.   Review of Systems  All other systems reviewed and are negative.      Objective:   Physical Exam BP 120/70   Pulse (!) 54   Ht 5\' 7"  (1.702 m)   Wt 236 lb (107 kg)   SpO2 97%   BMI 36.96 kg/m   General Appearance:    Alert, cooperative, no distress, appears stated age  Head:    Normocephalic, without obvious abnormality, atraumatic  Eyes:    PERRL, conjunctiva/corneas clear, EOM's intact, fundi    benign  Ears:    Normal TM's and external ear canals  Nose:   Nares normal, mucosa normal, no drainage or sinus   tenderness  Throat:   Lips, mucosa, and tongue normal; teeth and gums normal  Neck:   Supple, no lymphadenopathy;  thyroid:  no   enlargement/tenderness/nodules; no carotid   bruit or JVD     Lungs:     Clear to auscultation bilaterally without wheezes, rales or     ronchi; respirations  unlabored      Heart:    Regular rate and rhythm, S1 and S2 normal, no murmur, rub   or gallop  Breast Exam:    Deferred to GYN  Abdomen:     Soft, non-tender, nondistended, normoactive bowel sounds,    no masses, no hepatosplenomegaly  Genitalia:    Deferred to GYN     Extremities:   No clubbing, cyanosis or edema  Pulses:   2+ and symmetric all extremities  Skin:   Skin color, texture, turgor normal, no rashes or lesions  Lymph nodes:   Cervical, supraclavicular, and axillary nodes normal  Neurologic:   CNII-XII intact, normal strength, sensation and gait; reflexes 2+ and symmetric throughout          Psych:   Normal mood, affect, hygiene and grooming.         Assessment & Plan:  Routine general medical examination at a health care facility - Plan: POCT Urinalysis DIP (Proadvantage Device), CBC with Differential/Platelet, Comprehensive metabolic panel, Lipid panel  Essential hypertension - Plan: POCT Urinalysis DIP (Proadvantage Device), CBC with Differential/Platelet, Comprehensive metabolic panel, Lipid panel, olmesartan-hydrochlorothiazide (BENICAR HCT) 40-12.5 MG tablet  Gastroesophageal reflux disease, esophagitis presence not specified  History of sarcoidosis  Hyperlipidemia with target LDL less than 100 - Plan: pravastatin (PRAVACHOL) 40 MG tablet  Hypothyroidism, unspecified type -  Plan: TSH, levothyroxine (SYNTHROID, LEVOTHROID) 88 MCG tablet  Impaired fasting glucose - Plan: Comprehensive metabolic panel  Left tibialis posterior tendinitis  Class 2 obesity due to excess calories without serious comorbidity with body mass index (BMI) of 37.0 to 37.9 in adult - Plan: CBC with Differential/Platelet, Comprehensive metabolic panel, Lipid panel  Other seasonal allergic rhinitis  RLS (restless legs syndrome) - Plan: rOPINIRole (REQUIP) 0.5 MG tablet  Pain in joint of left shoulder  Gastroesophageal reflux disease without esophagitis - Plan: pantoprazole (PROTONIX) 40  MG tablet She will continue on her present medication regimen for her hypertension, hyperlipidemia and thyroid. She will continue be followed by orthopedics for her shoulder and foot trouble. Did recommend she cut back to using the Protonix on an as-needed basis as well as the Pepcid. She will continue to use Requip on an as-needed basis as well. Encouraged her to increase her physical activity as well as making dietary changes specifically cutting back on carbohydrates.

## 2017-05-12 ENCOUNTER — Other Ambulatory Visit: Payer: Self-pay | Admitting: Family Medicine

## 2017-05-12 DIAGNOSIS — I10 Essential (primary) hypertension: Secondary | ICD-10-CM
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
## 2017-06-23 ENCOUNTER — Other Ambulatory Visit (INDEPENDENT_AMBULATORY_CARE_PROVIDER_SITE_OTHER): Payer: Managed Care, Other (non HMO)

## 2017-06-23 DIAGNOSIS — Z23 Encounter for immunization: Secondary | ICD-10-CM | POA: Diagnosis not present

## 2017-06-30 ENCOUNTER — Other Ambulatory Visit: Payer: Self-pay | Admitting: Family Medicine

## 2017-06-30 DIAGNOSIS — Z1231 Encounter for screening mammogram for malignant neoplasm of breast: Secondary | ICD-10-CM

## 2017-08-03 ENCOUNTER — Ambulatory Visit
Admission: RE | Admit: 2017-08-03 | Discharge: 2017-08-03 | Disposition: A | Discharge status: Home / Self Care | Payer: Managed Care, Other (non HMO) | Source: Ambulatory Visit | Attending: Family Medicine | Admitting: Family Medicine

## 2017-08-03 DIAGNOSIS — Z1231 Encounter for screening mammogram for malignant neoplasm of breast: Secondary | ICD-10-CM

## 2017-09-07 ENCOUNTER — Encounter: Payer: Self-pay | Admitting: Family Medicine

## 2017-09-07 ENCOUNTER — Ambulatory Visit (INDEPENDENT_AMBULATORY_CARE_PROVIDER_SITE_OTHER): Payer: Managed Care, Other (non HMO) | Admitting: Family Medicine

## 2017-09-07 VITALS — BP 132/70 | HR 60 | Temp 98.0°F | Resp 18 | Wt 232.8 lb

## 2017-09-07 DIAGNOSIS — J209 Acute bronchitis, unspecified: Secondary | ICD-10-CM

## 2017-09-07 DIAGNOSIS — J302 Other seasonal allergic rhinitis: Secondary | ICD-10-CM

## 2017-09-07 MED ORDER — CLARITHROMYCIN 500 MG PO TABS: 500.0000 mg | ORAL_TABLET | Freq: Two times a day (BID) | ORAL | 0 refills | Status: DC

## 2017-09-07 MED ORDER — ALBUTEROL SULFATE HFA 108 (90 BASE) MCG/ACT IN AERS: 2.0000 | INHALATION_SPRAY | Freq: Four times a day (QID) | RESPIRATORY_TRACT | 2 refills | Status: DC | PRN

## 2017-09-07 NOTE — Progress Notes (Signed)
   Subjective:    Patient ID: Lisa [name], female    DOB: 1961-04-25, 56 y.o.   MRN: [id number]  HPI She complains of a 2642-month history of and malaise initially and since then continued cough that has become intermittently productive.  No fever, chills, sore throat, earache, chest pain or shortness of breath.  She does have underlying allergies and does take Zyrtec.  Does not smoke.   Review of Systems     Objective:   Physical Exam Alert and in no distress. Tympanic membranes and canals are normal. Pharyngeal area is normal. Neck is supple without adenopathy or thyromegaly. Cardiac exam shows a regular sinus rhythm without murmurs or gallops. Lungs are clear to auscultation.         Assessment & Plan:  Acute bronchitis, unspecified organism - Plan: clarithromycin (BIAXIN) 500 MG tablet, albuterol (PROVENTIL HFA;VENTOLIN HFA) 108 (90 Base) MCG/ACT inhaler  Other seasonal allergic rhinitis  She will call me if not entirely better when she finishes the antibiotic

## 2017-09-07 NOTE — Patient Instructions (Signed)
See if the albuterol inhaler will help you

## 2018-02-22 ENCOUNTER — Encounter: Payer: Self-pay | Admitting: Family Medicine

## 2018-02-22 ENCOUNTER — Ambulatory Visit (INDEPENDENT_AMBULATORY_CARE_PROVIDER_SITE_OTHER): Payer: Managed Care, Other (non HMO) | Admitting: Family Medicine

## 2018-02-22 VITALS — BP 140/82 | HR 51 | Temp 98.0°F | Ht 67.75 in | Wt 223.0 lb

## 2018-02-22 DIAGNOSIS — E039 Hypothyroidism, unspecified: Secondary | ICD-10-CM

## 2018-02-22 DIAGNOSIS — G2581 Restless legs syndrome: Secondary | ICD-10-CM

## 2018-02-22 DIAGNOSIS — R252 Cramp and spasm: Secondary | ICD-10-CM

## 2018-02-22 DIAGNOSIS — E785 Hyperlipidemia, unspecified: Secondary | ICD-10-CM | POA: Diagnosis not present

## 2018-02-22 DIAGNOSIS — J302 Other seasonal allergic rhinitis: Secondary | ICD-10-CM | POA: Diagnosis not present

## 2018-02-22 DIAGNOSIS — Z862 Personal history of diseases of the blood and blood-forming organs and certain disorders involving the immune mechanism: Secondary | ICD-10-CM | POA: Diagnosis not present

## 2018-02-22 DIAGNOSIS — I10 Essential (primary) hypertension: Secondary | ICD-10-CM

## 2018-02-22 MED ORDER — ROPINIROLE HCL 0.5 MG PO TABS: ORAL_TABLET | ORAL | 1 refills | Status: DC

## 2018-02-22 NOTE — Patient Instructions (Signed)
Leg Cramps Leg cramps occur when a muscle or muscles tighten and you have no control over this tightening (involuntary muscle contraction). Muscle cramps can develop in any muscle, but the most common place is in the calf muscles of the leg. Those cramps can occur during exercise or when you are at rest. Leg cramps are painful, and they may last for a few seconds to a few minutes. Cramps may return several times before they finally stop. Usually, leg cramps are not caused by a serious medical problem. In many cases, the cause is not known. Some common causes include:  Overexertion.  Overuse from repetitive motions, or doing the same thing over and over.  Remaining in a certain position for a long period of time.  Improper preparation, form, or technique while performing a sport or an activity.  Dehydration.  Injury.  Side effects of some medicines.  Abnormally low levels of the salts and ions in your blood (electrolytes), especially potassium and calcium. These levels could be low if you are taking water pills (diuretics) or if you are pregnant.  Follow these instructions at home: Watch your condition for any changes. Taking the following actions may help to lessen any discomfort that you are feeling:  Stay well-hydrated. Drink enough fluid to keep your urine clear or pale yellow.  Try massaging, stretching, and relaxing the affected muscle. Do this for several minutes at a time.  For tight or tense muscles, use a warm towel, heating pad, or hot shower water directed to the affected area.  If you are sore or have pain after a cramp, applying ice to the affected area may relieve discomfort. ? Put ice in a plastic bag. ? Place a towel between your skin and the bag. ? Leave the ice on for 20 minutes, 2-3 times per day.  Avoid strenuous exercise for several days if you have been having frequent leg cramps.  Make sure that your diet includes the essential minerals for your muscles to  work normally.  Take medicines only as directed by your health care provider.  Contact a health care provider if:  Your leg cramps get more severe or more frequent, or they do not improve over time.  Your foot becomes cold, numb, or blue. This information is not intended to replace advice given to you by your health care provider. Make sure you discuss any questions you have with your health care provider. Document Released: 11/13/2004 Document Revised: 03/13/2016 Document Reviewed: 09/13/2014 Elsevier Interactive Patient Education  2018 [employer].  Keep yourself well-hydrated and stretch out like I showed you when you get a cramp

## 2018-02-22 NOTE — Progress Notes (Signed)
   Subjective:    Patient ID: Lisa [name], female    DOB: January 02, 1961, 57 y.o.   MRN: [id number]  HPI She complains of a several year history of intermittent difficulty with leg cramps mainly in the calf area.  She states that her work and daily activities have not changed.  She does have an underlying history of RLS and sometimes the cramps are associated with this.  She does not use that medication on a regular basis.  She continues on medications listed in the chart.  Does have a history of hypothyroid.  Also has a history of sarcoidosis but this does not appear to be active.   Review of Systems     Objective:   Physical Exam Alert and in no distress.  Lower extremity exam shows no tenderness or swelling.  Negative Homans sign.       Assessment & Plan:  RLS (restless legs syndrome) - Plan: CBC with Differential/Platelet, Comprehensive metabolic panel  Essential hypertension - Plan: CBC with Differential/Platelet, Comprehensive metabolic panel  Hypothyroidism, unspecified type - Plan: TSH  History of sarcoidosis  Hyperlipidemia with target LDL less than 100 - Plan: Lipid panel  Leg cramps  She is on no new medicines that might possibly cause this.  No history of recent injury or overuse.  I will do some basic blood screening to rule out any underlying pathology.  She is scheduled for complete exam in July.  Discussed keeping herself well-hydrated as well as stretching techniques.  Continue on present medications and use Requip regularly.

## 2018-02-23 LAB — CBC WITH DIFFERENTIAL/PLATELET
BASOS ABS: 0 10*3/uL (ref 0.0–0.2)
BASOS: 1 %
EOS (ABSOLUTE): 0.1 10*3/uL (ref 0.0–0.4)
Eos: 2 %
Hematocrit: 44.3 % (ref 34.0–46.6)
Hemoglobin: 14.6 g/dL (ref 11.1–15.9)
IMMATURE GRANS (ABS): 0 10*3/uL (ref 0.0–0.1)
Immature Granulocytes: 0 %
LYMPHS: 32 %
Lymphocytes Absolute: 2 10*3/uL (ref 0.7–3.1)
MCH: 29.1 pg (ref 26.6–33.0)
MCHC: 33 g/dL (ref 31.5–35.7)
MCV: 88 fL (ref 79–97)
MONOCYTES: 7 %
Monocytes Absolute: 0.4 10*3/uL (ref 0.1–0.9)
NEUTROS ABS: 3.6 10*3/uL (ref 1.4–7.0)
Neutrophils: 58 %
Platelets: 252 10*3/uL (ref 150–379)
RBC: 5.01 x10E6/uL (ref 3.77–5.28)
RDW: 15 % (ref 12.3–15.4)
WBC: 6.2 10*3/uL (ref 3.4–10.8)

## 2018-02-23 LAB — LIPID PANEL
Chol/HDL Ratio: 2.9 ratio (ref 0.0–4.4)
Cholesterol, Total: 162 mg/dL (ref 100–199)
HDL: 55 mg/dL (ref >39)
LDL CALC: 89 mg/dL (ref 0–99)
Triglycerides: 92 mg/dL (ref 0–149)
VLDL CHOLESTEROL CAL: 18 mg/dL (ref 5–40)

## 2018-02-23 LAB — COMPREHENSIVE METABOLIC PANEL
ALBUMIN: 4.4 g/dL (ref 3.5–5.5)
ALT: 21 IU/L (ref 0–32)
AST: 21 IU/L (ref 0–40)
Albumin/Globulin Ratio: 1.6 (ref 1.2–2.2)
Alkaline Phosphatase: 87 IU/L (ref 39–117)
BILIRUBIN TOTAL: 0.4 mg/dL (ref 0.0–1.2)
BUN / CREAT RATIO: 14 (ref 9–23)
BUN: 15 mg/dL (ref 6–24)
CALCIUM: 9.6 mg/dL (ref 8.7–10.2)
CHLORIDE: 104 mmol/L (ref 96–106)
CO2: 24 mmol/L (ref 20–29)
CREATININE: 1.04 mg/dL — AB (ref 0.57–1.00)
GFR, EST AFRICAN AMERICAN: 69 mL/min/{1.73_m2} (ref >59)
GFR, EST NON AFRICAN AMERICAN: 60 mL/min/{1.73_m2} (ref >59)
GLUCOSE: 93 mg/dL (ref 65–99)
Globulin, Total: 2.8 g/dL (ref 1.5–4.5)
Potassium: 4.6 mmol/L (ref 3.5–5.2)
Sodium: 143 mmol/L (ref 134–144)
Total Protein: 7.2 g/dL (ref 6.0–8.5)

## 2018-02-23 LAB — TSH: TSH: 3.76 u[IU]/mL (ref 0.450–4.500)

## 2018-03-24 ENCOUNTER — Telehealth: Payer: Self-pay | Admitting: Family Medicine

## 2018-03-24 NOTE — Telephone Encounter (Signed)
Pt states RLS no better, meds not helping, nor stretching.  Would like muscle relaxer or something else,  Wakes her up from sleep.  Cramps every days.  Please call & advise, ok to leave her message

## 2018-03-25 NOTE — Telephone Encounter (Signed)
Called pt no answer lvm. KH 

## 2018-03-25 NOTE — Telephone Encounter (Signed)
Have her double the dose

## 2018-03-26 NOTE — Telephone Encounter (Signed)
Called pt again . No answer lvm. KH

## 2018-04-05 ENCOUNTER — Encounter: Payer: Managed Care, Other (non HMO) | Admitting: Family Medicine

## 2018-04-23 ENCOUNTER — Encounter: Payer: Managed Care, Other (non HMO) | Admitting: Family Medicine

## 2018-05-18 ENCOUNTER — Other Ambulatory Visit: Payer: Self-pay | Admitting: Family Medicine

## 2018-05-18 DIAGNOSIS — K219 Gastro-esophageal reflux disease without esophagitis: Secondary | ICD-10-CM

## 2018-05-31 ENCOUNTER — Other Ambulatory Visit: Payer: Self-pay | Admitting: Family Medicine

## 2018-05-31 DIAGNOSIS — Z1231 Encounter for screening mammogram for malignant neoplasm of breast: Secondary | ICD-10-CM

## 2018-06-03 ENCOUNTER — Encounter: Payer: Self-pay | Admitting: Family Medicine

## 2018-06-03 ENCOUNTER — Ambulatory Visit (INDEPENDENT_AMBULATORY_CARE_PROVIDER_SITE_OTHER): Payer: Managed Care, Other (non HMO) | Admitting: Family Medicine

## 2018-06-03 VITALS — BP 134/86 | HR 55 | Temp 97.8°F | Wt 227.0 lb

## 2018-06-03 DIAGNOSIS — R7301 Impaired fasting glucose: Secondary | ICD-10-CM

## 2018-06-03 DIAGNOSIS — E785 Hyperlipidemia, unspecified: Secondary | ICD-10-CM | POA: Diagnosis not present

## 2018-06-03 DIAGNOSIS — Z9119 Patient's noncompliance with other medical treatment and regimen: Secondary | ICD-10-CM

## 2018-06-03 DIAGNOSIS — Z91199 Patient's noncompliance with other medical treatment and regimen due to unspecified reason: Secondary | ICD-10-CM

## 2018-06-03 DIAGNOSIS — K219 Gastro-esophageal reflux disease without esophagitis: Secondary | ICD-10-CM | POA: Diagnosis not present

## 2018-06-03 DIAGNOSIS — Z23 Encounter for immunization: Secondary | ICD-10-CM | POA: Diagnosis not present

## 2018-06-03 DIAGNOSIS — I10 Essential (primary) hypertension: Secondary | ICD-10-CM

## 2018-06-03 DIAGNOSIS — E039 Hypothyroidism, unspecified: Secondary | ICD-10-CM

## 2018-06-03 DIAGNOSIS — Z0000 Encounter for general adult medical examination without abnormal findings: Secondary | ICD-10-CM | POA: Diagnosis not present

## 2018-06-03 DIAGNOSIS — Z862 Personal history of diseases of the blood and blood-forming organs and certain disorders involving the immune mechanism: Secondary | ICD-10-CM

## 2018-06-03 DIAGNOSIS — G2581 Restless legs syndrome: Secondary | ICD-10-CM | POA: Diagnosis not present

## 2018-06-03 DIAGNOSIS — Z6837 Body mass index (BMI) 37.0-37.9, adult: Secondary | ICD-10-CM

## 2018-06-03 DIAGNOSIS — E6609 Other obesity due to excess calories: Secondary | ICD-10-CM

## 2018-06-03 DIAGNOSIS — R252 Cramp and spasm: Secondary | ICD-10-CM

## 2018-06-03 NOTE — Progress Notes (Signed)
Subjective:    Patient ID: Lisa [name], female    DOB: Aug 28, 1961, 57 y.o.   MRN: [id number]  HPI She is here for complete examination.  She does complain of fatigue and does work third shift.  Apparently her sleeping habits are quite erratic because of that.  She also has underlying RLS that was apparently diagnosed with a sleep study.  She is on medication for that which apparently does help but she still does have some cramping in her legs usually related to physical activity.  She also admits to stopping her Pravachol, olmesartan and her thyroid medication for the last 3 months but cannot give me a good reason as to why she did stop taking these medications.  She does have reflux disease and does use Protonix for that.  She is also on multivitamins.  Smoking and drinking was reviewed.  Family and social history as well as health maintenance and immunizations were reviewed. She has had a hysterectomy. Review of Systems  All other systems reviewed and are negative.      Objective:   Physical Exam BP 134/86 (BP Location: Right Arm, Patient Position: Sitting)   Pulse (!) 55   Temp 97.8 F (36.6 C)   Wt 227 lb (103 kg)   SpO2 96%   BMI 34.77 kg/m   General Appearance:    Alert, cooperative, no distress, appears stated age  Head:    Normocephalic, without obvious abnormality, atraumatic  Eyes:    PERRL, conjunctiva/corneas clear, EOM's intact, fundi    benign  Ears:    Normal TM's and external ear canals  Nose:   Nares normal, mucosa normal, no drainage or sinus   tenderness  Throat:   Lips, mucosa, and tongue normal; teeth and gums normal  Neck:   Supple, no lymphadenopathy;  thyroid:  no   enlargement/tenderness/nodules; no carotid   bruit or JVD     Lungs:     Clear to auscultation bilaterally without wheezes, rales or     ronchi; respirations unlabored      Heart:    Regular rate and rhythm, S1 and S2 normal, no murmur, rub   or gallop  Breast Exam:    Deferred to GYN    Abdomen:     Soft, non-tender, nondistended, normoactive bowel sounds,    no masses, no hepatosplenomegaly  Genitalia:    Deferred to GYN     Extremities:   No clubbing, cyanosis or edema  Pulses:   2+ and symmetric all extremities  Skin:   Skin color, texture, turgor normal, no rashes or lesions  Lymph nodes:   Cervical, supraclavicular, and axillary nodes normal  Neurologic:   CNII-XII intact, normal strength, sensation and gait; reflexes 1+ and symmetric throughout          Psych:   Normal mood, affect, hygiene and grooming.          Assessment & Plan:  Routine general medical examination at a health care facility - Plan: CBC with Differential/Platelet, Comprehensive metabolic panel, Lipid panel  Gastroesophageal reflux disease, esophagitis presence not specified  RLS (restless legs syndrome)  Hyperlipidemia with target LDL less than 100 - Plan: Lipid panel  Essential hypertension - Plan: CBC with Differential/Platelet, Comprehensive metabolic panel  Hypothyroidism, unspecified type - Plan: TSH  History of sarcoidosis  Leg cramps  Impaired fasting glucose - Plan: Comprehensive metabolic panel  Class 2 obesity due to excess calories without serious comorbidity with body mass index (BMI) of 37.0  to 37.9 in adult - Plan: CBC with Differential/Platelet, Comprehensive metabolic panel, Lipid panel  Need for influenza vaccination - Plan: Flu Vaccine QUAD 6+ mos PF IM (Fluarix Quad PF)  Personal history of noncompliance with medical treatment, presenting hazards to health  I discussed the noncompliance with her and she could not give me a good reason as to why she stopped.  She does recognize the benefit of being on all these medications in regard to her health.  She does plan to start an exercise program.  I again reinforced the need for better eating habits especially in regard to cutting back on carbohydrates. Discussed the RLS with her.  She will continue on her  Requip. Also recommend stretching exercises for her legs as well as wearing support stockings.  We then discussed better sleeping habits.  I recommended that she try to go to bed later in the morning to try and readjust her sleeping habits to get more than just 3 hours sleep that she is been getting in the past.  I will await the blood work and decide on medication management at that point.

## 2018-06-04 LAB — CBC WITH DIFFERENTIAL/PLATELET
BASOS: 0 %
Basophils Absolute: 0 10*3/uL (ref 0.0–0.2)
EOS (ABSOLUTE): 0.2 10*3/uL (ref 0.0–0.4)
EOS: 3 %
HEMATOCRIT: 42 % (ref 34.0–46.6)
Hemoglobin: 14 g/dL (ref 11.1–15.9)
Immature Grans (Abs): 0 10*3/uL (ref 0.0–0.1)
Immature Granulocytes: 0 %
LYMPHS ABS: 2.6 10*3/uL (ref 0.7–3.1)
Lymphs: 36 %
MCH: 29.3 pg (ref 26.6–33.0)
MCHC: 33.3 g/dL (ref 31.5–35.7)
MCV: 88 fL (ref 79–97)
Monocytes Absolute: 0.4 10*3/uL (ref 0.1–0.9)
Monocytes: 5 %
Neutrophils Absolute: 4 10*3/uL (ref 1.4–7.0)
Neutrophils: 56 %
Platelets: 239 10*3/uL (ref 150–450)
RBC: 4.78 x10E6/uL (ref 3.77–5.28)
RDW: 15.3 % (ref 12.3–15.4)
WBC: 7.2 10*3/uL (ref 3.4–10.8)

## 2018-06-04 LAB — LIPID PANEL
CHOLESTEROL TOTAL: 168 mg/dL (ref 100–199)
Chol/HDL Ratio: 3.7 ratio (ref 0.0–4.4)
HDL: 46 mg/dL (ref >39)
LDL Calculated: 101 mg/dL — ABNORMAL HIGH (ref 0–99)
TRIGLYCERIDES: 107 mg/dL (ref 0–149)
VLDL Cholesterol Cal: 21 mg/dL (ref 5–40)

## 2018-06-04 LAB — COMPREHENSIVE METABOLIC PANEL
A/G RATIO: 1.7 (ref 1.2–2.2)
ALK PHOS: 86 IU/L (ref 39–117)
ALT: 22 IU/L (ref 0–32)
AST: 23 IU/L (ref 0–40)
Albumin: 4.3 g/dL (ref 3.5–5.5)
BILIRUBIN TOTAL: 0.3 mg/dL (ref 0.0–1.2)
BUN/Creatinine Ratio: 17 (ref 9–23)
BUN: 15 mg/dL (ref 6–24)
CO2: 26 mmol/L (ref 20–29)
Calcium: 9.5 mg/dL (ref 8.7–10.2)
Chloride: 103 mmol/L (ref 96–106)
Creatinine, Ser: 0.86 mg/dL (ref 0.57–1.00)
GFR calc Af Amer: 87 mL/min/{1.73_m2} (ref >59)
GFR calc non Af Amer: 75 mL/min/{1.73_m2} (ref >59)
GLOBULIN, TOTAL: 2.6 g/dL (ref 1.5–4.5)
Glucose: 87 mg/dL (ref 65–99)
POTASSIUM: 4.3 mmol/L (ref 3.5–5.2)
SODIUM: 142 mmol/L (ref 134–144)
Total Protein: 6.9 g/dL (ref 6.0–8.5)

## 2018-06-04 LAB — TSH: TSH: 4.58 u[IU]/mL — ABNORMAL HIGH (ref 0.450–4.500)

## 2018-06-30 ENCOUNTER — Other Ambulatory Visit: Payer: Self-pay | Admitting: Family Medicine

## 2018-06-30 DIAGNOSIS — I10 Essential (primary) hypertension: Secondary | ICD-10-CM

## 2018-08-09 ENCOUNTER — Ambulatory Visit
Admission: RE | Admit: 2018-08-09 | Discharge: 2018-08-09 | Disposition: A | Discharge status: Home / Self Care | Payer: Managed Care, Other (non HMO) | Source: Ambulatory Visit | Attending: Family Medicine | Admitting: Family Medicine

## 2018-08-09 DIAGNOSIS — Z1231 Encounter for screening mammogram for malignant neoplasm of breast: Secondary | ICD-10-CM

## 2018-10-05 LAB — HM COLONOSCOPY

## 2018-10-08 DIAGNOSIS — K5790 Diverticulosis of intestine, part unspecified, without perforation or abscess without bleeding: Secondary | ICD-10-CM | POA: Insufficient documentation

## 2018-10-24 ENCOUNTER — Other Ambulatory Visit: Payer: Self-pay | Admitting: Family Medicine

## 2018-10-24 DIAGNOSIS — E785 Hyperlipidemia, unspecified: Secondary | ICD-10-CM

## 2018-10-24 DIAGNOSIS — E039 Hypothyroidism, unspecified: Secondary | ICD-10-CM

## 2018-10-26 ENCOUNTER — Other Ambulatory Visit: Payer: Self-pay | Admitting: Family Medicine

## 2018-10-26 DIAGNOSIS — E039 Hypothyroidism, unspecified: Secondary | ICD-10-CM

## 2018-12-29 ENCOUNTER — Other Ambulatory Visit: Payer: Self-pay | Admitting: Family Medicine

## 2018-12-29 DIAGNOSIS — I10 Essential (primary) hypertension: Secondary | ICD-10-CM

## 2019-02-22 ENCOUNTER — Encounter: Payer: Self-pay | Admitting: Family Medicine

## 2019-02-22 ENCOUNTER — Ambulatory Visit (INDEPENDENT_AMBULATORY_CARE_PROVIDER_SITE_OTHER): Payer: Managed Care, Other (non HMO) | Admitting: Family Medicine

## 2019-02-22 ENCOUNTER — Other Ambulatory Visit: Payer: Self-pay

## 2019-02-22 DIAGNOSIS — M25561 Pain in right knee: Secondary | ICD-10-CM | POA: Diagnosis not present

## 2019-02-22 DIAGNOSIS — M25562 Pain in left knee: Secondary | ICD-10-CM | POA: Diagnosis not present

## 2019-02-22 DIAGNOSIS — M25532 Pain in left wrist: Secondary | ICD-10-CM | POA: Diagnosis not present
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
## 2019-04-19 ENCOUNTER — Other Ambulatory Visit: Payer: Self-pay | Admitting: Family Medicine

## 2019-04-19 DIAGNOSIS — E785 Hyperlipidemia, unspecified: Secondary | ICD-10-CM

## 2019-04-29 ENCOUNTER — Other Ambulatory Visit: Payer: Self-pay | Admitting: Family Medicine

## 2019-04-29 DIAGNOSIS — K219 Gastro-esophageal reflux disease without esophagitis: Secondary | ICD-10-CM

## 2019-06-06 ENCOUNTER — Encounter: Payer: Managed Care, Other (non HMO) | Admitting: Family Medicine

## 2019-06-10 ENCOUNTER — Encounter: Payer: Self-pay | Admitting: Family Medicine

## 2019-06-10 ENCOUNTER — Other Ambulatory Visit: Payer: Self-pay

## 2019-06-10 ENCOUNTER — Ambulatory Visit (INDEPENDENT_AMBULATORY_CARE_PROVIDER_SITE_OTHER): Payer: Managed Care, Other (non HMO) | Admitting: Family Medicine

## 2019-06-10 VITALS — BP 140/80 | HR 62 | Temp 98.1°F | Ht 68.0 in | Wt 234.2 lb

## 2019-06-10 DIAGNOSIS — K219 Gastro-esophageal reflux disease without esophagitis: Secondary | ICD-10-CM | POA: Diagnosis not present

## 2019-06-10 DIAGNOSIS — Z6837 Body mass index (BMI) 37.0-37.9, adult: Secondary | ICD-10-CM

## 2019-06-10 DIAGNOSIS — R7301 Impaired fasting glucose: Secondary | ICD-10-CM

## 2019-06-10 DIAGNOSIS — E039 Hypothyroidism, unspecified: Secondary | ICD-10-CM

## 2019-06-10 DIAGNOSIS — Z23 Encounter for immunization: Secondary | ICD-10-CM

## 2019-06-10 DIAGNOSIS — Z96642 Presence of left artificial hip joint: Secondary | ICD-10-CM

## 2019-06-10 DIAGNOSIS — G2581 Restless legs syndrome: Secondary | ICD-10-CM

## 2019-06-10 DIAGNOSIS — I10 Essential (primary) hypertension: Secondary | ICD-10-CM

## 2019-06-10 DIAGNOSIS — Z862 Personal history of diseases of the blood and blood-forming organs and certain disorders involving the immune mechanism: Secondary | ICD-10-CM

## 2019-06-10 DIAGNOSIS — K5790 Diverticulosis of intestine, part unspecified, without perforation or abscess without bleeding: Secondary | ICD-10-CM

## 2019-06-10 DIAGNOSIS — Z0000 Encounter for general adult medical examination without abnormal findings: Secondary | ICD-10-CM | POA: Diagnosis not present

## 2019-06-10 DIAGNOSIS — J302 Other seasonal allergic rhinitis: Secondary | ICD-10-CM

## 2019-06-10 DIAGNOSIS — E6609 Other obesity due to excess calories: Secondary | ICD-10-CM

## 2019-06-10 DIAGNOSIS — E785 Hyperlipidemia, unspecified: Secondary | ICD-10-CM

## 2019-06-10 MED ORDER — AMLODIPINE BESYLATE 5 MG PO TABS: 5.0000 mg | ORAL_TABLET | Freq: Every day | ORAL | 3 refills | Status: DC

## 2019-06-10 MED ORDER — PRAVASTATIN SODIUM 40 MG PO TABS: 40.0000 mg | ORAL_TABLET | Freq: Every day | ORAL | 3 refills | Status: DC

## 2019-06-10 MED ORDER — OLMESARTAN MEDOXOMIL-HCTZ 40-12.5 MG PO TABS: 1.0000 | ORAL_TABLET | Freq: Every day | ORAL | 0 refills | Status: DC

## 2019-06-10 MED ORDER — ROPINIROLE HCL 0.5 MG PO TABS: ORAL_TABLET | ORAL | 1 refills | Status: DC

## 2019-06-10 MED ORDER — LEVOTHYROXINE SODIUM 88 MCG PO TABS: 88.0000 ug | ORAL_TABLET | Freq: Every day | ORAL | 3 refills | Status: DC

## 2019-06-10 NOTE — Progress Notes (Signed)
Subjective:    Patient ID: Lisa [name], female    DOB: [birth year], 58 y.o.   MRN: [id number]  HPI She is here for complete examination.  She has made some recent diet and exercise changes to help with her weight.  She does have a previous history of sarcoidosis but has not had any pulmonary symptoms recently and does recognize the need to follow-up with pulmonology.  She continues on her thyroid medication but admits to not taking this regularly.  She continues on olmesartan/HCTZ for her blood pressure.  She does use Pepcid on an as-needed basis and is not taking Protonix . alsohas also taken Pravachol and having no difficulty with that.  She uses Requip on an as-needed basis for her RLS.  Her allergies are well controlled on Zyrtec.  She does have a history of diverticulosis. Family and social history as well as health maintenance and immunizations was reviewed   Review of Systems  All other systems reviewed and are negative.      Objective:   Physical Exam BP 140/80   Pulse 62   Temp 98.1 F (36.7 C) (Oral)   Ht 5\' 8"  (1.727 m)   Wt 234 lb 3.2 oz (106.2 kg)   SpO2 96%   BMI 35.61 kg/m   General Appearance:    Alert, cooperative, no distress, appears stated age  Head:    Normocephalic, without obvious abnormality, atraumatic  Eyes:    PERRL, conjunctiva/corneas clear, EOM's intact, fundi    benign  Ears:    Normal TM's and external ear canals  Nose:   Nares normal, mucosa normal, no drainage or sinus   tenderness  Throat:   Lips, mucosa, and tongue normal; teeth and gums normal  Neck:   Supple, no lymphadenopathy;  thyroid:  no   enlargement/tenderness/nodules; no carotid   bruit or JVD  Back:    Spine nontender, no curvature, ROM normal, no CVA     tenderness  Lungs:     Clear to auscultation bilaterally without wheezes, rales or     ronchi; respirations unlabored  Chest Wall:    No tenderness or deformity   Heart:    Regular rate and rhythm, S1 and S2 normal, no  murmur, rub   or gallop  Breast Exam:    Deferred to GYN  Abdomen:     Soft, non-tender, nondistended, normoactive bowel sounds,    no masses, no hepatosplenomegaly  Genitalia:    Deferred to GYN     Extremities:   No clubbing, cyanosis or edema  Pulses:   2+ and symmetric all extremities  Skin:   Skin color, texture, turgor normal, no rashes or lesions  Lymph nodes:   Cervical, supraclavicular, and axillary nodes normal  Neurologic:   CNII-XII intact, normal strength, sensation and gait; reflexes 2+ and symmetric throughout          Psych:   Normal mood, affect, hygiene and grooming.         Assessment & Plan:  Routine general medical examination at a health care facility - Plan: CBC with Differential/Platelet, Comprehensive metabolic panel, Lipid panel  Hypothyroidism, unspecified type - Plan: TSH  Class 2 obesity due to excess calories without serious comorbidity with body mass index (BMI) of 37.0 to 37.9 in adult  Essential hypertension - Plan: CBC with Differential/Platelet, Comprehensive metabolic panel Amlodipine will be added to her regimen.  Return here in 1 month for follow-up. Gastroesophageal reflux disease, esophagitis presence not specified  Status post total replacement of left hip  Hyperlipidemia with target LDL less than 100 - Plan: Lipid panel  Impaired fasting glucose - Plan: Comprehensive metabolic panel  RLS (restless legs syndrome) Continue on an as-needed basis for the medication.  Other seasonal allergic rhinitis  Diverticulosis  History of sarcoidosis She is to follow-up with pulmonary concerning her sarcoid. Need for influenza vaccination - Plan: Flu Vaccine QUAD 6+ mos PF IM (Fluarix Quad PF) Continue on present meds  Discussed diet and exercise with her in regard to eating more frequent but smaller meals especially in regard to cutting down on evening meals. Also discussed the need for her to take her thyroid medication on an empty stomach and  not you are taking any other medications.  She did come up with a new dietary regimen concerning that

## 2019-06-11 LAB — LIPID PANEL
Chol/HDL Ratio: 2.6 ratio (ref 0.0–4.4)
Cholesterol, Total: 123 mg/dL (ref 100–199)
HDL: 48 mg/dL (ref >39)
LDL Calculated: 60 mg/dL (ref 0–99)
Triglycerides: 73 mg/dL (ref 0–149)
VLDL Cholesterol Cal: 15 mg/dL (ref 5–40)

## 2019-06-11 LAB — CBC WITH DIFFERENTIAL/PLATELET
Basophils Absolute: 0 10*3/uL (ref 0.0–0.2)
Basos: 1 %
EOS (ABSOLUTE): 0.1 10*3/uL (ref 0.0–0.4)
Eos: 2 %
Hematocrit: 38.8 % (ref 34.0–46.6)
Hemoglobin: 13.1 g/dL (ref 11.1–15.9)
Immature Grans (Abs): 0 10*3/uL (ref 0.0–0.1)
Immature Granulocytes: 0 %
Lymphocytes Absolute: 1.7 10*3/uL (ref 0.7–3.1)
Lymphs: 32 %
MCH: 29.8 pg (ref 26.6–33.0)
MCHC: 33.8 g/dL (ref 31.5–35.7)
MCV: 88 fL (ref 79–97)
Monocytes Absolute: 0.3 10*3/uL (ref 0.1–0.9)
Monocytes: 7 %
Neutrophils Absolute: 3 10*3/uL (ref 1.4–7.0)
Neutrophils: 58 %
Platelets: 234 10*3/uL (ref 150–450)
RBC: 4.4 x10E6/uL (ref 3.77–5.28)
RDW: 13.2 % (ref 11.7–15.4)
WBC: 5.2 10*3/uL (ref 3.4–10.8)

## 2019-06-11 LAB — COMPREHENSIVE METABOLIC PANEL
ALT: 22 IU/L (ref 0–32)
AST: 25 IU/L (ref 0–40)
Albumin/Globulin Ratio: 1.9 (ref 1.2–2.2)
Albumin: 4.4 g/dL (ref 3.8–4.9)
Alkaline Phosphatase: 74 IU/L (ref 39–117)
BUN/Creatinine Ratio: 15 (ref 9–23)
BUN: 14 mg/dL (ref 6–24)
Bilirubin Total: 0.5 mg/dL (ref 0.0–1.2)
CO2: 27 mmol/L (ref 20–29)
Calcium: 9.3 mg/dL (ref 8.7–10.2)
Chloride: 104 mmol/L (ref 96–106)
Creatinine, Ser: 0.96 mg/dL (ref 0.57–1.00)
GFR calc Af Amer: 75 mL/min/{1.73_m2} (ref >59)
GFR calc non Af Amer: 65 mL/min/{1.73_m2} (ref >59)
Globulin, Total: 2.3 g/dL (ref 1.5–4.5)
Glucose: 101 mg/dL — ABNORMAL HIGH (ref 65–99)
Potassium: 4.2 mmol/L (ref 3.5–5.2)
Sodium: 143 mmol/L (ref 134–144)
Total Protein: 6.7 g/dL (ref 6.0–8.5)

## 2019-06-11 LAB — TSH: TSH: 2.97 u[IU]/mL (ref 0.450–4.500)

## 2019-06-17 ENCOUNTER — Other Ambulatory Visit: Payer: Self-pay

## 2019-06-17 ENCOUNTER — Other Ambulatory Visit: Payer: Managed Care, Other (non HMO)

## 2019-06-20 ENCOUNTER — Telehealth: Payer: Self-pay | Admitting: Family Medicine

## 2019-06-20 NOTE — Telephone Encounter (Signed)
LVM for pt to called pt office back

## 2019-06-20 NOTE — Telephone Encounter (Signed)
Margalit called confused about her bp readings,  She wants to know is she supposed to come back for bp again.

## 2019-06-22 NOTE — Telephone Encounter (Signed)
Have her come in for a visit in a month

## 2019-06-22 NOTE — Telephone Encounter (Signed)
Pt came in for b/p check Friday of this past weelk. B/P was 140/80. Please advise if she you have any questionswill need to return for appt . Please see genera if you have any questions due to me not being I the office. Kh

## 2019-06-23 NOTE — Telephone Encounter (Signed)
Patient scheduled for 07/22/2019

## 2019-06-28 ENCOUNTER — Other Ambulatory Visit: Payer: Self-pay | Admitting: Family Medicine

## 2019-06-28 DIAGNOSIS — Z1231 Encounter for screening mammogram for malignant neoplasm of breast: Secondary | ICD-10-CM

## 2019-07-08 ENCOUNTER — Ambulatory Visit (INDEPENDENT_AMBULATORY_CARE_PROVIDER_SITE_OTHER): Payer: Managed Care, Other (non HMO) | Admitting: Family Medicine

## 2019-07-08 ENCOUNTER — Encounter: Payer: Self-pay | Admitting: Family Medicine

## 2019-07-08 ENCOUNTER — Other Ambulatory Visit: Payer: Self-pay

## 2019-07-08 ENCOUNTER — Ambulatory Visit
Admission: RE | Admit: 2019-07-08 | Discharge: 2019-07-08 | Disposition: A | Discharge status: Home / Self Care | Payer: Managed Care, Other (non HMO) | Source: Ambulatory Visit | Attending: Family Medicine | Admitting: Family Medicine

## 2019-07-08 VITALS — BP 138/80 | HR 57 | Temp 96.4°F | Wt 236.0 lb

## 2019-07-08 DIAGNOSIS — M25571 Pain in right ankle and joints of right foot: Secondary | ICD-10-CM

## 2019-07-08 DIAGNOSIS — M25572 Pain in left ankle and joints of left foot: Secondary | ICD-10-CM

## 2019-07-08 DIAGNOSIS — Z96642 Presence of left artificial hip joint: Secondary | ICD-10-CM

## 2019-07-08 DIAGNOSIS — E6609 Other obesity due to excess calories: Secondary | ICD-10-CM

## 2019-07-08 DIAGNOSIS — Z6837 Body mass index (BMI) 37.0-37.9, adult: Secondary | ICD-10-CM

## 2019-07-08 MED ORDER — DICLOFENAC SODIUM 75 MG PO TBEC: 75.0000 mg | DELAYED_RELEASE_TABLET | Freq: Two times a day (BID) | ORAL | 1 refills | Status: DC

## 2019-07-08 NOTE — Progress Notes (Signed)
   Subjective:    Patient ID: Lisa [name], female    DOB: 1961-06-15, 58 y.o.   MRN: [id number]  HPI She complains of a one-month history of bilateral ankle pain and swelling.  It is made worse with standing but also is present when she gets up in the morning.  She does have a history of hip replacement but states that she is noted no joint pain in other areas of her body.  No fever, chills or other joint swelling.  No chest pain, shortness of breath or PND.  She has tried multiple OTC meds without much success.   Review of Systems     Objective:   Physical Exam Alert and in no distress.  Exam of the left ankle does show some swelling in the medial and lateral malleolar line.  Pain on motion of the ankle.  No tenderness over tarsals or metatarsals.  Minimal effusion noted in the right ankle with good motion there.       Assessment & Plan:  Bilateral ankle pain, unspecified chronicity - Plan: diclofenac (VOLTAREN) 75 MG EC tablet, DG Ankle Complete Left  Class 2 obesity due to excess calories without serious comorbidity with body mass index (BMI) of 37.0 to 37.9 in adult  Status post total replacement of left hip I will switch her to Voltaren and check to see the amount of arthritic changes.  Discussed the fact that her weight is playing a role in making the pain worse.  We will discuss weight loss with her at a later date.

## 2019-07-11 ENCOUNTER — Other Ambulatory Visit: Payer: Self-pay

## 2019-07-11 ENCOUNTER — Encounter (HOSPITAL_COMMUNITY): Payer: Self-pay | Admitting: Emergency Medicine

## 2019-07-11 ENCOUNTER — Ambulatory Visit (HOSPITAL_COMMUNITY)
Admission: EM | Admit: 2019-07-11 | Discharge: 2019-07-11 | Disposition: A | Discharge status: Home / Self Care | Payer: Managed Care, Other (non HMO) | Attending: Family Medicine | Admitting: Family Medicine

## 2019-07-11 DIAGNOSIS — H60312 Diffuse otitis externa, left ear: Secondary | ICD-10-CM

## 2019-07-11 MED ORDER — CIPROFLOXACIN-DEXAMETHASONE 0.3-0.1 % OT SUSP: 4.0000 [drp] | Freq: Two times a day (BID) | OTIC | 0 refills | Status: AC

## 2019-07-11 MED ORDER — HYDROCODONE-ACETAMINOPHEN 5-325 MG PO TABS: 1.0000 | ORAL_TABLET | Freq: Three times a day (TID) | ORAL | 0 refills | Status: DC | PRN

## 2019-07-11 MED ORDER — CIPROFLOXACIN HCL 500 MG PO TABS: 500.0000 mg | ORAL_TABLET | Freq: Two times a day (BID) | ORAL | 0 refills | Status: DC

## 2019-07-11 NOTE — ED Triage Notes (Signed)
Pt with left sided earache x 4 days with some swelling per pt

## 2019-07-11 NOTE — ED Provider Notes (Signed)
[facility]    CSN: [id number] Arrival date & time: 07/11/19  1823      History   Chief Complaint Chief Complaint  Patient presents with  . Otalgia    HPI Lisa [name] is a 58 y.o. female.   She is presenting with ear pain.  The pain is been ongoing for 2 days.  She is unable to hear out of the left ear.  The pain is moderate to severe.  Is becoming more constant.  She has been taking Voltaren with limited improvement.  Has a history of sarcoidosis.  No history of diabetes.  HPI  Past Medical History:  Diagnosis Date  . Anemia   . Esophageal reflux   . Essential hypertension, benign   . Hyperlipidemia   . Impaired fasting glucose    02/2010--fglu 126, A1c 6.2  . Low back pain    Dr. Noel Geroldohen  . Obesity, unspecified   . Pneumonia    years ago   . Restless leg syndrome   . Sarcoidosis    pulmonary and hepatic; followed by Dr. Sherene SiresWert  . Unspecified hypothyroidism   . Unspecified vitamin D deficiency    resolved    Patient Active Problem List   Diagnosis Date Noted  . Diverticulosis 10/08/2018  . Pes planus 01/08/2017  . RLS (restless legs syndrome) 01/04/2016  . Other seasonal allergic rhinitis 01/04/2016  . 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 Impaired fasting glucose 06/04/2011  . Hyperlipidemia with target LDL less than 100 06/04/2011  . Obesity 02/03/2008  . History of sarcoidosis 09/07/2007  . Hypothyroidism 09/07/2007  . Essential hypertension 09/07/2007  . GERD 09/07/2007    Past Surgical History:  Procedure Laterality Date  . ABDOMINAL HYSTERECTOMY  03/2005   Dr. Earlene Plateravis  . TOTAL HIP ARTHROPLASTY Left 11/14/2014   Procedure: LEFT TOTAL HIP ARTHROPLASTY ANTERIOR APPROACH;  Surgeon: Kathryne Hitchhristopher Y Blackman, MD;  Location: [facility];  Service: Orthopedics;  Laterality: Left;    OB History   No obstetric history on file.      Home Medications    Prior to Admission medications   Medication Sig Start Date End Date Taking?  Authorizing Provider  acetaminophen (TYLENOL) 650 MG CR tablet Take 650 mg by mouth every 8 (eight) hours as needed for pain.    [provider]  Acetaminophen-Codeine (TYLENOL WITH CODEINE #3 PO) Take 1 tablet by mouth.    [provider]  albuterol (PROVENTIL HFA;VENTOLIN HFA) 108 (90 Base) MCG/ACT inhaler Inhale 2 puffs every 6 (six) hours as needed into the lungs for wheezing or shortness of breath. Patient not taking: Reported on 06/10/2019 09/07/17   Ronnald NianLalonde, John C, MD  amLODipine (NORVASC) 5 MG tablet Take 1 tablet (5 mg total) by mouth daily. 06/10/19   Ronnald NianLalonde, John C, MD  Aspirin-Caffeine (BC FAST PAIN RELIEF PO) Take by mouth.    [provider]  BLACK CURRANT SEED OIL PO Take by mouth.    [provider]  cetirizine (ZYRTEC) 10 MG tablet Take 10 mg by mouth at bedtime as needed for allergies.     [provider]  Cholecalciferol (VITAMIN D) 2000 units CAPS Take 1 capsule by mouth.    [provider]  ciprofloxacin (CIPRO) 500 MG tablet Take 1 tablet (500 mg total) by mouth every 12 (twelve) hours. 07/11/19   Myra RudeSchmitz, Zuhair Lariccia E, MD  ciprofloxacin-dexamethasone (CIPRODEX) OTIC suspension Place 4 drops into the left ear 2 (two) times daily for  10 days. 07/11/19 07/21/19  Myra RudeSchmitz, Patsie Mccardle E, MD  clarithromycin (BIAXIN) 500 MG tablet Take 1 tablet (500 mg total) 2 (two) times daily by mouth. Patient not taking: Reported on 02/22/2018 09/07/17   Ronnald NianLalonde, John C, MD  clindamycin (CLEOCIN) 150 MG capsule Take 1 mg by mouth 4 (four) times daily.    [provider]  co-enzyme Q-10 30 MG capsule Take 30 mg by mouth 3 (three) times daily.    [provider]  Cyanocobalamin (VITAMIN B-12) 2500 MCG SUBL Place 1 tablet under the tongue daily.    [provider]  cycloSPORINE (RESTASIS) 0.05 % ophthalmic emulsion Place 1 drop into both eyes 2 (two) times daily.      [provider]  diclofenac (VOLTAREN) 75 MG EC tablet  Take 1 tablet (75 mg total) by mouth 2 (two) times daily. 07/08/19   Ronnald NianLalonde, John C, MD  famotidine (PEPCID) 20 MG tablet One at bedtime 09/05/13   Nyoka CowdenWert, Michael B, MD  fluticasone [facility](FLONASE) 50 MCG/ACT nasal spray Place 2 sprays into the nose daily as needed for allergies. Reported on 02/04/2016 12/13/12   Ronnald NianLalonde, John C, MD  HYDROcodone-acetaminophen (NORCO/VICODIN) 5-325 MG tablet Take 1 tablet by mouth every 8 (eight) hours as needed for moderate pain. 07/11/19   Myra RudeSchmitz, Iram Astorino E, MD  ibuprofen (ADVIL) 200 MG tablet Take 200 mg by mouth every 4 (four) hours as needed.    [provider]  levothyroxine (SYNTHROID) 88 MCG tablet Take 1 tablet (88 mcg total) by mouth daily. 06/10/19   Ronnald NianLalonde, John C, MD  Menthol, Topical Analgesic, (BIOFREEZE EX) Apply 1 application topically 3 (three) times daily as needed (for pain).    [provider]  Multiple Vitamins-Minerals (CENTRUM SILVER) tablet Take by mouth daily. 02/17/11   Parrett, Virgel Bouquetammy S, NP  olmesartan-hydrochlorothiazide (BENICAR HCT) 40-12.5 MG tablet Take 1 tablet by mouth daily. 06/10/19   Ronnald NianLalonde, John C, MD  pravastatin (PRAVACHOL) 40 MG tablet Take 1 tablet (40 mg total) by mouth daily. 06/10/19   Ronnald NianLalonde, John C, MD  rOPINIRole (REQUIP) 0.5 MG tablet TAKE 1 TABLET AT BEDTIME AS NEEDED FOR RESTLESS LEG 06/10/19   Ronnald NianLalonde, John C, MD  zolpidem (AMBIEN) 5 MG tablet Take 1 tablet (5 mg total) by mouth at bedtime as needed for sleep. Patient not taking: Reported on 06/03/2018 03/03/16   Ronnald NianLalonde, John C, MD    Family History Family History  Problem Relation Age of Onset  . Cancer Mother        bladder  . Cancer Father [id number]       colon cancer  . Diabetes Maternal Grandfather     Social History Social History   Tobacco Use  . Smoking status: Former Smoker    Packs/day: 0.50    Years: 20.00    Pack years: 10.00    Types: Cigarettes    Quit date: 10/20/2004    Years since quitting: 14.7  . Smokeless tobacco: Never Used   Substance Use Topics  . Alcohol use: Yes    Comment: occasional  . Drug use: No     Allergies   Bupropion hcl   Review of Systems Review of Systems  Constitutional: Negative for fever.  HENT: Positive for ear pain. Negative for congestion.   Respiratory: Negative for cough.   Cardiovascular: Negative for chest pain.  Gastrointestinal: Negative for abdominal distention.  Musculoskeletal: Negative for back pain.  Skin: Negative for color change.  Neurological: Negative for weakness.  Hematological: Negative  for adenopathy.     Physical Exam Triage Vital Signs ED Triage Vitals  Enc Vitals Group     BP 07/11/19 1921 (!) 155/94     Pulse Rate 07/11/19 1921 79     Resp 07/11/19 1921 16     Temp 07/11/19 1921 98.1 F (36.7 C)     Temp Source 07/11/19 1921 Temporal     SpO2 07/11/19 1921 99 %     Weight --      Height --      Head Circumference --      Peak Flow --      Pain Score 07/11/19 1927 7     Pain Loc --      Pain Edu? --      Excl. in [place]? --    No data found.  Updated Vital Signs BP (!) 155/94 (BP Location: Right Arm)   Pulse 79   Temp 98.1 F (36.7 C) (Temporal)   Resp 16   SpO2 99%   Visual Acuity Right Eye Distance:   Left Eye Distance:   Bilateral Distance:    Right Eye Near:   Left Eye Near:    Bilateral Near:     Physical Exam Gen: NAD, alert, cooperative with exam,  ENT: normal lips, normal nasal mucosa, , normal oropharynx, no cervical lymphadenopathy, unable to visualize the right tympanic membrane secondary to cerumen, unable to visualize the auditory canal of the left due to swelling.  Tenderness over the tragus.  Redness over the external auditory canal. Eye: normal EOM, normal conjunctiva and lids CV:  no edema, +2 pedal pulses, regular rate and rhythm, S1-S2   Resp: no accessory muscle use, non-labored, clear to auscultation bilaterally, no crackles or wheezes Skin: no rashes, no areas of induration  Neuro: normal tone, normal  sensation to touch Psych:  normal insight, alert and oriented MSK: Normal gait, normal strength   An ear wick was placed in the left external canal.   UC Treatments / Results  Labs (all labs ordered are listed, but only abnormal results are displayed) Labs Reviewed - No data to display  EKG   Radiology No results found.  Procedures Procedures (including critical care time)  Medications Ordered in UC Medications - No data to display  Initial Impression / Assessment and Plan / UC Course  I have reviewed the triage vital signs and the nursing notes.  Pertinent labs & imaging results that were available during my care of the patient were reviewed by me and considered in my medical decision making (see chart for details).     Falesha is a 58 year old female is presenting with otitis externa.  Spoke with ENT on-call and suggested antibiotics and ear wick.  Earwick was placed.  Ciprodex and ciprofloxacin was provided.  Norco was provided for severe pain surety has Voltaren.  Provided information to follow-up in 48 to 72 hours with the ENT.  Counseled on supportive care.  Given indications return follow-up.  Final Clinical Impressions(s) / UC Diagnoses   Final diagnoses:  Acute diffuse otitis externa of left ear     Discharge Instructions     Please start the antibiotics  Please use the voltaren. You can take the norco if the pain is still occurring. Please do not operate heavy machinery or drive with the norco.  Please make an appointment tomorrow to be seen by the Ear, Nose and Throat doctor on Thursday.     ED Prescriptions    Medication Sig  Dispense Auth. Provider   ciprofloxacin-dexamethasone (CIPRODEX) OTIC suspension Place 4 drops into the left ear 2 (two) times daily for 10 days. 4 mL Rosemarie Ax, MD   ciprofloxacin (CIPRO) 500 MG tablet Take 1 tablet (500 mg total) by mouth every 12 (twelve) hours. 20 tablet Rosemarie Ax, MD   HYDROcodone-acetaminophen  (NORCO/VICODIN) 5-325 MG tablet Take 1 tablet by mouth every 8 (eight) hours as needed for moderate pain. 10 tablet Rosemarie Ax, MD     I have reviewed the PDMP during this encounter.   Rosemarie Ax, MD 07/11/19 2046

## 2019-07-11 NOTE — Discharge Instructions (Addendum)
Please start the antibiotics  Please use the voltaren. You can take the norco if the pain is still occurring. Please do not operate heavy machinery or drive with the norco.  Please make an appointment tomorrow to be seen by the Ear, Nose and Throat doctor on Thursday.

## 2019-07-22 ENCOUNTER — Other Ambulatory Visit: Payer: Self-pay

## 2019-07-22 ENCOUNTER — Encounter: Payer: Self-pay | Admitting: Family Medicine

## 2019-07-22 ENCOUNTER — Ambulatory Visit (INDEPENDENT_AMBULATORY_CARE_PROVIDER_SITE_OTHER): Payer: Managed Care, Other (non HMO) | Admitting: Family Medicine

## 2019-07-22 VITALS — BP 130/84 | HR 70 | Temp 96.8°F | Wt 236.0 lb

## 2019-07-22 DIAGNOSIS — I10 Essential (primary) hypertension: Secondary | ICD-10-CM | POA: Diagnosis not present

## 2019-07-22 NOTE — Progress Notes (Signed)
   Subjective:    Patient ID: Lisa [name], female    DOB: Feb 05, 1961, 58 y.o.   MRN: [id number]  HPI She is here for a blood pressure check.  She presently is on Benicar HCT and amlodipine and having no difficulty with that.   Review of Systems     Objective:   Physical Exam Alert and in no distress.  Blood pressure is recorded.  Her machine and ours are equal.       Assessment & Plan:  Essential hypertension Recommend she check her blood pressure monthly in the resting sitting position with arm at heart level.  Continue on present medications.

## 2019-08-08 ENCOUNTER — Other Ambulatory Visit: Payer: Self-pay | Admitting: Family Medicine

## 2019-08-08 DIAGNOSIS — M25571 Pain in right ankle and joints of right foot: Secondary | ICD-10-CM

## 2019-08-08 NOTE — Telephone Encounter (Signed)
[facility] is requesting to fill pt voltaren .Please advise KH 

## 2019-08-12 ENCOUNTER — Ambulatory Visit
Admission: RE | Admit: 2019-08-12 | Discharge: 2019-08-12 | Disposition: A | Discharge status: Home / Self Care | Payer: Managed Care, Other (non HMO) | Source: Ambulatory Visit | Attending: Family Medicine | Admitting: Family Medicine

## 2019-08-12 ENCOUNTER — Other Ambulatory Visit: Payer: Self-pay

## 2019-08-12 DIAGNOSIS — Z1231 Encounter for screening mammogram for malignant neoplasm of breast: Secondary | ICD-10-CM

## 2019-09-07 ENCOUNTER — Other Ambulatory Visit: Payer: Self-pay

## 2019-09-07 DIAGNOSIS — Z20822 Contact with and (suspected) exposure to covid-19: Secondary | ICD-10-CM

## 2019-09-10 LAB — NOVEL CORONAVIRUS, NAA: SARS-CoV-2, NAA: NOT DETECTED

## 2019-09-14 ENCOUNTER — Other Ambulatory Visit: Payer: Self-pay | Admitting: Family Medicine

## 2019-09-14 DIAGNOSIS — I10 Essential (primary) hypertension: Secondary | ICD-10-CM

## 2019-09-14 DIAGNOSIS — M25571 Pain in right ankle and joints of right foot: Secondary | ICD-10-CM

## 2019-09-14 NOTE — Telephone Encounter (Signed)
[facility] is requesting to fill pt diclofenac. Please advise KH 

## 2019-09-18 ENCOUNTER — Ambulatory Visit (INDEPENDENT_AMBULATORY_CARE_PROVIDER_SITE_OTHER)
Admission: EM | Admit: 2019-09-18 | Discharge: 2019-09-18 | Disposition: A | Discharge status: Other Acute Inpatient Hospital | Payer: Managed Care, Other (non HMO) | Source: Home / Self Care

## 2019-09-18 ENCOUNTER — Encounter (HOSPITAL_COMMUNITY): Payer: Self-pay

## 2019-09-18 ENCOUNTER — Emergency Department (HOSPITAL_COMMUNITY): Payer: Managed Care, Other (non HMO)

## 2019-09-18 ENCOUNTER — Encounter (HOSPITAL_COMMUNITY): Payer: Self-pay | Admitting: Emergency Medicine

## 2019-09-18 ENCOUNTER — Emergency Department (HOSPITAL_COMMUNITY)
Admission: EM | Admit: 2019-09-18 | Discharge: 2019-09-18 | Disposition: A | Discharge status: Home / Self Care | Payer: Managed Care, Other (non HMO) | Attending: Emergency Medicine | Admitting: Emergency Medicine

## 2019-09-18 ENCOUNTER — Other Ambulatory Visit: Payer: Self-pay

## 2019-09-18 DIAGNOSIS — Z79899 Other long term (current) drug therapy: Secondary | ICD-10-CM | POA: Insufficient documentation

## 2019-09-18 DIAGNOSIS — K529 Noninfective gastroenteritis and colitis, unspecified: Secondary | ICD-10-CM | POA: Diagnosis not present

## 2019-09-18 DIAGNOSIS — E039 Hypothyroidism, unspecified: Secondary | ICD-10-CM | POA: Insufficient documentation

## 2019-09-18 DIAGNOSIS — I10 Essential (primary) hypertension: Secondary | ICD-10-CM | POA: Diagnosis not present

## 2019-09-18 DIAGNOSIS — K921 Melena: Secondary | ICD-10-CM

## 2019-09-18 DIAGNOSIS — R112 Nausea with vomiting, unspecified: Secondary | ICD-10-CM | POA: Diagnosis not present

## 2019-09-18 DIAGNOSIS — Z87891 Personal history of nicotine dependence: Secondary | ICD-10-CM | POA: Diagnosis not present

## 2019-09-18 DIAGNOSIS — N201 Calculus of ureter: Secondary | ICD-10-CM

## 2019-09-18 LAB — COMPREHENSIVE METABOLIC PANEL
ALT: 26 U/L (ref 0–44)
AST: 28 U/L (ref 15–41)
Albumin: 4 g/dL (ref 3.5–5.0)
Alkaline Phosphatase: 60 U/L (ref 38–126)
Anion gap: 11 (ref 5–15)
BUN: 17 mg/dL (ref 6–20)
CO2: 24 mmol/L (ref 22–32)
Calcium: 9.3 mg/dL (ref 8.9–10.3)
Chloride: 108 mmol/L (ref 98–111)
Creatinine, Ser: 0.87 mg/dL (ref 0.44–1.00)
GFR calc Af Amer: >60 mL/min (ref >60)
GFR calc non Af Amer: >60 mL/min (ref >60)
Glucose, Bld: 96 mg/dL (ref 70–99)
Potassium: 3.9 mmol/L (ref 3.5–5.1)
Sodium: 143 mmol/L (ref 135–145)
Total Bilirubin: 0.6 mg/dL (ref 0.3–1.2)
Total Protein: 7.2 g/dL (ref 6.5–8.1)

## 2019-09-18 LAB — CBC
HCT: 40.3 % (ref 36.0–46.0)
Hemoglobin: 13.3 g/dL (ref 12.0–15.0)
MCH: 30.1 pg (ref 26.0–34.0)
MCHC: 33 g/dL (ref 30.0–36.0)
MCV: 91.2 fL (ref 80.0–100.0)
Platelets: 243 10*3/uL (ref 150–400)
RBC: 4.42 MIL/uL (ref 3.87–5.11)
RDW: 14.5 % (ref 11.5–15.5)
WBC: 12.1 10*3/uL — ABNORMAL HIGH (ref 4.0–10.5)
nRBC: 0 % (ref 0.0–0.2)

## 2019-09-18 LAB — POC OCCULT BLOOD, ED: Fecal Occult Bld: POSITIVE — AB

## 2019-09-18 LAB — I-STAT BETA HCG BLOOD, ED (MC, WL, AP ONLY): I-stat hCG, quantitative: <5 m[IU]/mL (ref <5)

## 2019-09-18 LAB — TYPE AND SCREEN
ABO/RH(D): B POS
Antibody Screen: NEGATIVE

## 2019-09-18 LAB — LIPASE, BLOOD: Lipase: 25 U/L (ref 11–51)

## 2019-09-18 MED ORDER — DICYCLOMINE HCL 10 MG/ML IM SOLN
20.0000 mg | Freq: Once | INTRAMUSCULAR | Status: AC
  Administered 2019-09-18: 20 mg via INTRAMUSCULAR
  Filled 2019-09-18: qty 2

## 2019-09-18 MED ORDER — IOHEXOL 300 MG/ML  SOLN
125.0000 mL | Freq: Once | INTRAMUSCULAR | Status: AC | PRN
  Administered 2019-09-18: 125 mL via INTRAVENOUS

## 2019-09-18 MED ORDER — ONDANSETRON 4 MG PO TBDP
ORAL_TABLET | ORAL | Status: AC
  Filled 2019-09-18: qty 1

## 2019-09-18 MED ORDER — DICYCLOMINE HCL 20 MG PO TABS: 20.0000 mg | ORAL_TABLET | Freq: Two times a day (BID) | ORAL | 0 refills | Status: DC

## 2019-09-18 MED ORDER — ONDANSETRON 4 MG PO TBDP: ORAL_TABLET | ORAL | 0 refills | Status: DC

## 2019-09-18 MED ORDER — CIPROFLOXACIN HCL 500 MG PO TABS: 500.0000 mg | ORAL_TABLET | Freq: Two times a day (BID) | ORAL | 0 refills | Status: DC

## 2019-09-18 MED ORDER — METRONIDAZOLE 500 MG PO TABS: 500.0000 mg | ORAL_TABLET | Freq: Two times a day (BID) | ORAL | 0 refills | Status: DC

## 2019-09-18 MED ORDER — TAMSULOSIN HCL 0.4 MG PO CAPS: 0.4000 mg | ORAL_CAPSULE | Freq: Every day | ORAL | 0 refills | Status: DC

## 2019-09-18 MED ORDER — ONDANSETRON 4 MG PO TBDP
4.0000 mg | ORAL_TABLET | Freq: Once | ORAL | Status: AC
  Administered 2019-09-18: 4 mg via ORAL

## 2019-09-18 MED ORDER — METRONIDAZOLE 500 MG PO TABS
500.0000 mg | ORAL_TABLET | Freq: Once | ORAL | Status: AC
  Administered 2019-09-18: 500 mg via ORAL
  Filled 2019-09-18: qty 1

## 2019-09-18 MED ORDER — ONDANSETRON HCL 4 MG/2ML IJ SOLN
4.0000 mg | Freq: Once | INTRAMUSCULAR | Status: AC
  Administered 2019-09-18: 4 mg via INTRAVENOUS
  Filled 2019-09-18: qty 2

## 2019-09-18 MED ORDER — CIPROFLOXACIN HCL 500 MG PO TABS
500.0000 mg | ORAL_TABLET | Freq: Once | ORAL | Status: AC
  Administered 2019-09-18: 500 mg via ORAL
  Filled 2019-09-18: qty 1

## 2019-09-18 MED ORDER — SODIUM CHLORIDE 0.9 % IV BOLUS
1000.0000 mL | Freq: Once | INTRAVENOUS | Status: AC
  Administered 2019-09-18: 1000 mL via INTRAVENOUS

## 2019-09-18 NOTE — ED Triage Notes (Signed)
Patient presents ambulatory c/o generalized abdominal cramping along with bright red blood in stool x 1 week. Given zofran at [facility].

## 2019-09-18 NOTE — ED Provider Notes (Signed)
[facility]    CSN: [id number] Arrival date & time: 09/18/19  1453      History   Chief Complaint Chief Complaint  Patient presents with  . Abdominal Pain  . Blood in Stool    HPI Lisa [name] is a 58 y.o. female history of hypertension, sarcoidosis, GERD, presenting today for evaluation of abdominal pain nausea and blood in the stool.  Patient states that last week she noticed some blood mixed within her stools that were formed.  She did not continue to notice this, but in the last 2 days she has noticed blood in the stools again.  Stools have moved from being formed to loose and to now mainly defecating straight bright red blood.  She has abdominal pain associated with needing to use the bathroom described as cramping sensation.  Improves with defecation.  The sensation comes approximately every 40 minutes.  She denies any fevers.  Denies close contacts with similar symptoms.  Denies any recent travel.  She has family history of colon cancer, her father passed away from this.  Last colonoscopy was 2 years ago.  Has colonoscopies every 5 years.  Has known diverticulosis, but denies history of diverticulitis.  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  Denies blood thinners.  HPI  Past Medical History:  Diagnosis Date  . Anemia   . Esophageal reflux   . Essential hypertension, benign   . Hyperlipidemia   . Impaired fasting glucose    02/2010--fglu 126, A1c 6.2  . Low back pain    Dr. Noel Gerold  . Obesity, unspecified   . Pneumonia    years ago   . Restless leg syndrome   . Sarcoidosis    pulmonary and hepatic; followed by Dr. Sherene Sires  . Unspecified hypothyroidism   . Unspecified vitamin D deficiency    resolved    Patient Active Problem List   Diagnosis Date Noted  . Diverticulosis 10/08/2018  . Pes planus 01/08/2017  . RLS (restless legs syndrome) 01/04/2016  . Other seasonal allergic rhinitis 01/04/2016  . Status post total replacement of left hip  11/14/2014  . Impaired fasting glucose 06/04/2011  . Hyperlipidemia with target LDL less than 100 06/04/2011  . Obesity 02/03/2008  . History of sarcoidosis 09/07/2007  . Hypothyroidism 09/07/2007  . Essential hypertension 09/07/2007  . GERD 09/07/2007    Past Surgical History:  Procedure Laterality Date  . ABDOMINAL HYSTERECTOMY  03/2005   Dr. Earlene Plater  . TOTAL HIP ARTHROPLASTY Left 11/14/2014   Procedure: LEFT TOTAL HIP ARTHROPLASTY ANTERIOR APPROACH;  Surgeon: Kathryne Hitch, MD;  Location: [facility];  Service: Orthopedics;  Laterality: Left;    OB History   No obstetric history on file.      Home Medications    Prior to Admission medications   Medication Sig Start Date End Date Taking? Authorizing Provider  acetaminophen (TYLENOL) 650 MG CR tablet Take 650 mg by mouth every 8 (eight) hours as needed for pain.    [provider]  Acetaminophen-Codeine (TYLENOL WITH CODEINE #3 PO) Take 1 tablet by mouth.    [provider]  albuterol (PROVENTIL HFA;VENTOLIN HFA) 108 (90 Base) MCG/ACT inhaler Inhale 2 puffs every 6 (six) hours as needed into the lungs for wheezing or shortness of breath. Patient not taking: Reported on 06/10/2019 09/07/17   Ronnald Nian, MD  amLODipine (NORVASC) 5 MG tablet Take 1 tablet (5 mg total) by mouth daily. 06/10/19   Ronnald Nian, MD  Aspirin-Caffeine (BC FAST PAIN RELIEF PO) Take by mouth.    [provider]  BLACK CURRANT SEED OIL PO Take by mouth.    [provider]  cetirizine (ZYRTEC) 10 MG tablet Take 10 mg by mouth at bedtime as needed for allergies.     [provider]  Cholecalciferol (VITAMIN D) 2000 units CAPS Take 1 capsule by mouth.    [provider]  clindamycin (CLEOCIN) 150 MG capsule Take 1 mg by mouth 4 (four) times daily.    [provider]  co-enzyme Q-10 30 MG capsule Take 30 mg by mouth 3 (three) times daily.    [provider]  Cyanocobalamin (VITAMIN  B-12) 2500 MCG SUBL Place 1 tablet under the tongue daily.    [provider]  cycloSPORINE (RESTASIS) 0.05 % ophthalmic emulsion Place 1 drop into both eyes 2 (two) times daily.      [provider]  diclofenac (VOLTAREN) 75 MG EC tablet TAKE 1 TABLET BY MOUTH TWICE A DAY 09/14/19   Ronnald NianLalonde, John C, MD  famotidine (PEPCID) 20 MG tablet One at bedtime 09/05/13   Nyoka CowdenWert, Michael B, MD  fluticasone [facility](FLONASE) 50 MCG/ACT nasal spray Place 2 sprays into the nose daily as needed for allergies. Reported on 02/04/2016 12/13/12   Ronnald NianLalonde, John C, MD  HYDROcodone-acetaminophen (NORCO/VICODIN) 5-325 MG tablet Take 1 tablet by mouth every 8 (eight) hours as needed for moderate pain. 07/11/19   Myra RudeSchmitz, Jeremy E, MD  ibuprofen (ADVIL) 200 MG tablet Take 200 mg by mouth every 4 (four) hours as needed.    [provider]  levothyroxine (SYNTHROID) 88 MCG tablet Take 1 tablet (88 mcg total) by mouth daily. 06/10/19   Ronnald NianLalonde, John C, MD  Menthol, Topical Analgesic, (BIOFREEZE EX) Apply 1 application topically 3 (three) times daily as needed (for pain).    [provider]  Multiple Vitamins-Minerals (CENTRUM SILVER) tablet Take by mouth daily. 02/17/11   Parrett, Virgel Bouquetammy S, NP  olmesartan-hydrochlorothiazide (BENICAR HCT) 40-12.5 MG tablet TAKE 1 TABLET BY MOUTH EVERY DAY 09/14/19   Ronnald NianLalonde, John C, MD  pravastatin (PRAVACHOL) 40 MG tablet Take 1 tablet (40 mg total) by mouth daily. 06/10/19   Ronnald NianLalonde, John C, MD  rOPINIRole (REQUIP) 0.5 MG tablet TAKE 1 TABLET AT BEDTIME AS NEEDED FOR RESTLESS LEG 06/10/19   Ronnald NianLalonde, John C, MD  zolpidem (AMBIEN) 5 MG tablet Take 1 tablet (5 mg total) by mouth at bedtime as needed for sleep. Patient not taking: Reported on 06/03/2018 03/03/16   Ronnald NianLalonde, John C, MD    Family History Family History  Problem Relation Age of Onset  . Cancer Mother        bladder  . Cancer Father [phone]       colon cancer  . Diabetes Maternal Grandfather     Social History  Social History   Tobacco Use  . Smoking status: Former Smoker    Packs/day: 0.50    Years: 20.00    Pack years: 10.00    Types: Cigarettes    Quit date: 10/20/2004    Years since quitting: 14.9  . Smokeless tobacco: Never Used  Substance Use Topics  . Alcohol use: Yes    Comment: occasional  . Drug use: No     Allergies   Bupropion hcl   Review of Systems Review of Systems  Constitutional: Negative for activity change, appetite change, chills, fatigue and fever.  HENT: Negative for congestion, ear pain, rhinorrhea, sinus pressure, sore throat  and trouble swallowing.   Eyes: Negative for discharge and redness.  Respiratory: Negative for cough, chest tightness and shortness of breath.   Cardiovascular: Negative for chest pain.  Gastrointestinal: Positive for abdominal pain, blood in stool and nausea. Negative for diarrhea and vomiting.  Musculoskeletal: Negative for myalgias.  Skin: Negative for rash.  Neurological: Negative for dizziness, light-headedness and headaches.     Physical Exam Triage Vital Signs ED Triage Vitals  Enc Vitals Group     BP 09/18/19 1525 122/73     Pulse Rate 09/18/19 1521 62     Resp 09/18/19 1521 16     Temp 09/18/19 1521 98.1 F (36.7 C)     Temp Source 09/18/19 1521 Oral     SpO2 09/18/19 1521 96 %     Weight --      Height --      Head Circumference --      Peak Flow --      Pain Score 09/18/19 1524 7     Pain Loc --      Pain Edu? --      Excl. in [place]? --    No data found.  Updated Vital Signs BP 122/73 (BP Location: Left Arm)   Pulse 62   Temp 98.1 F (36.7 C) (Oral)   Resp 16   SpO2 96%   Visual Acuity Right Eye Distance:   Left Eye Distance:   Bilateral Distance:    Right Eye Near:   Left Eye Near:    Bilateral Near:     Physical Exam Vitals signs and nursing note reviewed.  Constitutional:      Appearance: She is well-developed.     Comments: No acute distress  HENT:     Head: Normocephalic and atraumatic.      Nose: Nose normal.  Eyes:     Conjunctiva/sclera: Conjunctivae normal.  Neck:     Musculoskeletal: Neck supple.  Cardiovascular:     Rate and Rhythm: Normal rate.  Pulmonary:     Effort: Pulmonary effort is normal. No respiratory distress.     Comments: Breathing comfortably at rest, CTABL, no wheezing, rales or other adventitious sounds auscultated Abdominal:     General: There is no distension.     Comments: Soft, nondistended, nontender to light and deep palpation throughout abdomen  Genitourinary:    Comments: Rectal: No external hemorrhoids noted, no rectal tenderness or erythema or induration, small amount of blood noted on glove after digital rectal exam Musculoskeletal: Normal range of motion.  Skin:    General: Skin is warm and dry.  Neurological:     Mental Status: She is alert and oriented to person, place, and time.    -Patient showed picture of the toilet after using the restroom which showed bright red blood pooled in the toilet bowl  UC Treatments / Results  Labs (all labs ordered are listed, but only abnormal results are displayed) Labs Reviewed  POC OCCULT BLOOD, ED    EKG   Radiology No results found.  Procedures Procedures (including critical care time)  Medications Ordered in UC Medications  ondansetron (ZOFRAN-ODT) disintegrating tablet 4 mg (4 mg Oral Given 09/18/19 1603)  ondansetron (ZOFRAN-ODT) 4 MG disintegrating tablet (has no administration in time range)    Initial Impression / Assessment and Plan / UC Course  I have reviewed the triage vital signs and the nursing notes.  Pertinent labs & imaging results that were available during my care of the patient were reviewed  by me and considered in my medical decision making (see chart for details).    Patient with blood in stools, no sign of external hemorrhoids, given amount of bleeding seen in picture shown by patient as well as unclear cause recommending follow-up in emergency room for  further evaluation to ensure patient is stable for discharge. Symptoms may be from other infectious etiology or internal hemorrhoids, but given amount of bleeding would like to ensure patient is safe for discharge/outpatient follow-up with gastroenterologist.  Rule out GI bleed/diverticulitis, check CBC.  Patient provided Zofran prior to discharge and was sent to emergency room independently.    Final Clinical Impressions(s) / UC Diagnoses   Final diagnoses:  Hematochezia     Discharge Instructions     Ed for further workup    ED Prescriptions    None     PDMP not reviewed this encounter.   Lew Dawes, PA-C 09/18/19 1603

## 2019-09-18 NOTE — ED Provider Notes (Signed)
[facility] EMERGENCY DEPARTMENT Provider Note   CSN: [id number] Arrival date & time: 09/18/19  1604     History   Chief Complaint Chief Complaint  Patient presents with  . Blood In Stools    HPI Lisa [name] is a 58 y.o. female.     Lisa [name] is a 58 y.o. female with a history of hypertension, hyperlipidemia, sarcoidosis, obesity, RLS, GERD, who presents to the emergency department for evaluation of abdominal pain and blood in her stools.  Patient reports that over the past week she has experienced intermittent generalized cramping abdominal pain that she reports seems to be worse in the lower abdomen.  Pain seems to come and go and becomes worse prior to having a bowel movement.  She reports over the past week she has had 3-4 bowel movements where she has noticed some red blood mixed in with her stool, denies black stools and has not had large amounts of bright red blood filling the toilet bowl.  She reports today abdominal cramping became worse and she has had some diarrhea and then passed small amounts of blood, not mixed with stool.  She reports that today she has also been nauseated with a few episodes of nonbloody emesis.  She reports pain today still an intermittent crampy pain she denies severe abdominal pain.  She reports that she did eat at a restaurant yesterday and wonders if this is contributing, but has had blood in her stool throughout the week.  She denies any fevers or chills.  No urinary symptoms.  No fatigue lightheadedness, chest pain or shortness of breath.  Reports history of colitis once previous, has not been on antibiotics recently.  Is followed by Dr. Elnoria Howard with GI and is up-to-date on colonoscopy.  Patient is not on any blood thinners.     Past Medical History:  Diagnosis Date  . Anemia   . Esophageal reflux   . Essential hypertension, benign   . Hyperlipidemia   . Impaired fasting glucose    02/2010--fglu 126, A1c 6.2  . Low back  pain    Dr. Noel Gerold  . Obesity, unspecified   . Pneumonia    years ago   . Restless leg syndrome   . Sarcoidosis    pulmonary and hepatic; followed by Dr. Sherene Sires  . Unspecified hypothyroidism   . Unspecified vitamin D deficiency    resolved    Patient Active Problem List   Diagnosis Date Noted  . Diverticulosis 10/08/2018  . Pes planus 01/08/2017  . RLS (restless legs syndrome) 01/04/2016  . Other seasonal allergic rhinitis 01/04/2016  . 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 Impaired fasting glucose 06/04/2011  . Hyperlipidemia with target LDL less than 100 06/04/2011  . Obesity 02/03/2008  . History of sarcoidosis 09/07/2007  . Hypothyroidism 09/07/2007  . Essential hypertension 09/07/2007  . GERD 09/07/2007    Past Surgical History:  Procedure Laterality Date  . ABDOMINAL HYSTERECTOMY  03/2005   Dr. Earlene Plater  . TOTAL HIP ARTHROPLASTY Left 11/14/2014   Procedure: LEFT TOTAL HIP ARTHROPLASTY ANTERIOR APPROACH;  Surgeon: Kathryne Hitch, MD;  Location: [facility] OR;  Service: Orthopedics;  Laterality: Left;     OB History   No obstetric history on file.      Home Medications    Prior to Admission medications   Medication Sig Start Date End Date Taking? Authorizing Provider  albuterol (PROVENTIL HFA;VENTOLIN HFA) 108 (90 Base) MCG/ACT inhaler Inhale 2 puffs  every 6 (six) hours as needed into the lungs for wheezing or shortness of breath. 09/07/17  Yes Ronnald NianLalonde, John C, MD  amLODipine (NORVASC) 5 MG tablet Take 1 tablet (5 mg total) by mouth daily. 06/10/19  Yes Ronnald NianLalonde, John C, MD  Aspirin-Caffeine [facility](BC FAST PAIN RELIEF PO) Take by mouth.   Yes [provider]  cetirizine (ZYRTEC) 10 MG tablet Take 10 mg by mouth at bedtime as needed for allergies.    Yes [provider]  Cholecalciferol (VITAMIN D) 2000 units CAPS Take 1 capsule by mouth.   Yes [provider]  Cyanocobalamin (VITAMIN B-12) 2500 MCG SUBL Place 1 tablet under the  tongue daily.   Yes [provider]  cycloSPORINE (RESTASIS) 0.05 % ophthalmic emulsion Place 1 drop into both eyes 2 (two) times daily.     Yes [provider]  diclofenac (VOLTAREN) 75 MG EC tablet TAKE 1 TABLET BY MOUTH TWICE A DAY Patient taking differently: Take 75 mg by mouth 2 (two) times daily.  09/14/19  Yes Ronnald NianLalonde, John C, MD  famotidine (PEPCID) 20 MG tablet One at bedtime Patient taking differently: Take 20 mg by mouth at bedtime.  09/05/13  Yes Nyoka CowdenWert, Michael B, MD  fluticasone (FLONASE) 50 MCG/ACT nasal spray Place 2 sprays into the nose daily as needed for allergies. Reported on 02/04/2016 12/13/12  Yes Ronnald NianLalonde, John C, MD  levothyroxine (SYNTHROID) 88 MCG tablet Take 1 tablet (88 mcg total) by mouth daily. 06/10/19  Yes Ronnald NianLalonde, John C, MD  Menthol, Topical Analgesic, (BIOFREEZE EX) Apply 1 application topically 3 (three) times daily as needed (for pain).   Yes [provider]  Multiple Vitamins-Minerals (CENTRUM SILVER) tablet Take 1 tablet by mouth daily.  02/17/11  Yes Parrett, Tammy S, NP  olmesartan-hydrochlorothiazide (BENICAR HCT) 40-12.5 MG tablet TAKE 1 TABLET BY MOUTH EVERY DAY Patient taking differently: Take 1 tablet by mouth daily.  09/14/19  Yes Ronnald NianLalonde, John C, MD  pantoprazole (PROTONIX) 40 MG tablet Take 40 mg by mouth daily. 07/31/19  Yes [provider]  pravastatin (PRAVACHOL) 40 MG tablet Take 1 tablet (40 mg total) by mouth daily. 06/10/19  Yes Ronnald NianLalonde, John C, MD  rOPINIRole (REQUIP) 0.5 MG tablet TAKE 1 TABLET AT BEDTIME AS NEEDED FOR RESTLESS LEG Patient taking differently: Take 0.5 mg by mouth at bedtime as needed (restless leg).  06/10/19  Yes Ronnald NianLalonde, John C, MD  ciprofloxacin (CIPRO) 500 MG tablet Take 1 tablet (500 mg total) by mouth 2 (two) times daily. One po bid x 7 days 09/18/19   Dartha LodgeFord, Jamyron Redd N, PA-C  dicyclomine (BENTYL) 20 MG tablet Take 1 tablet (20 mg total) by mouth 2 (two) times daily. 09/18/19   Dartha LodgeFord, Munira Polson N,  PA-C  HYDROcodone-acetaminophen (NORCO/VICODIN) 5-325 MG tablet Take 1 tablet by mouth every 8 (eight) hours as needed for moderate pain. Patient not taking: Reported on 09/18/2019 07/11/19   Myra RudeSchmitz, Jeremy E, MD  metroNIDAZOLE (FLAGYL) 500 MG tablet Take 1 tablet (500 mg total) by mouth 2 (two) times daily. One po bid x 7 days 09/18/19   Dartha LodgeFord, Vegas Coffin N, PA-C  ondansetron [facility](ZOFRAN ODT) 4 MG disintegrating tablet 4mg  ODT q4 hours prn nausea/vomit 09/18/19   Dartha LodgeFord, Rico Massar N, PA-C  tamsulosin (FLOMAX) 0.4 MG CAPS capsule Take 1 capsule (0.4 mg total) by mouth daily. 09/18/19   Dartha LodgeFord, Jewelz Kobus N, PA-C  zolpidem (AMBIEN) 5 MG tablet Take 1 tablet (5 mg total) by mouth at bedtime as needed for sleep. Patient not  taking: Reported on 06/03/2018 03/03/16   Ronnald Nian, MD    Family History Family History  Problem Relation Age of Onset  . Cancer Mother        bladder  . Cancer Father 62       colon cancer  . Diabetes Maternal Grandfather     Social History Social History   Tobacco Use  . Smoking status: Former Smoker    Packs/day: 0.50    Years: 20.00    Pack years: 10.00    Types: Cigarettes    Quit date: 10/20/2004    Years since quitting: 14.9  . Smokeless tobacco: Never Used  Substance Use Topics  . Alcohol use: Yes    Comment: occasional  . Drug use: No     Allergies   Bupropion hcl   Review of Systems Review of Systems  Constitutional: Negative for chills, fatigue and fever.  HENT: Negative.   Respiratory: Negative for cough and shortness of breath.   Cardiovascular: Negative for chest pain.  Gastrointestinal: Positive for abdominal pain, blood in stool, diarrhea, nausea and vomiting.  Genitourinary: Negative for dysuria and frequency.  Musculoskeletal: Negative for arthralgias and myalgias.  Skin: Negative for color change and rash.  Neurological: Negative for dizziness, syncope, light-headedness and headaches.     Physical Exam Updated Vital Signs BP 124/77 (BP  Location: Left Arm)   Pulse (!) 57   Temp 98.1 F (36.7 C) (Oral)   Resp 16   Ht 5\' 7"  (1.702 m)   Wt 106.6 kg   SpO2 98%   BMI 36.81 kg/m   Physical Exam Vitals signs and nursing note reviewed.  Constitutional:      General: She is not in acute distress.    Appearance: Normal appearance. She is well-developed and normal weight. She is not ill-appearing or diaphoretic.     Comments: Well-appearing and in no distress  HENT:     Head: Normocephalic and atraumatic.     Mouth/Throat:     Mouth: Mucous membranes are moist.     Pharynx: Oropharynx is clear.  Eyes:     General:        Right eye: No discharge.        Left eye: No discharge.  Neck:     Musculoskeletal: Neck supple.  Cardiovascular:     Rate and Rhythm: Normal rate and regular rhythm.     Heart sounds: Normal heart sounds.  Pulmonary:     Effort: Pulmonary effort is normal. No respiratory distress.     Breath sounds: Normal breath sounds. No wheezing or rales.     Comments: Respirations equal and unlabored, patient able to speak in full sentences, lungs clear to auscultation bilaterally Abdominal:     General: Bowel sounds are normal. There is no distension.     Palpations: Abdomen is soft. There is no mass.     Tenderness: There is abdominal tenderness. There is no guarding.     Comments: Abdomen soft, nondistended, patient endorses some mild generalized tenderness, no guarding or peritoneal signs.  Genitourinary:    Comments: Chaperone present during rectal exam. No external hemorrhoids or fissures noted.  Soft stool with small amount of dark red blood present, no melena. Musculoskeletal:        General: No deformity.  Skin:    General: Skin is warm and dry.     Capillary Refill: Capillary refill takes less than 2 seconds.     Coloration: Skin is not pale.  Neurological:     Mental Status: She is alert.     Coordination: Coordination normal.     Comments: Speech is clear, able to follow commands Moves  extremities without ataxia, coordination intact  Psychiatric:        Mood and Affect: Mood normal.        Behavior: Behavior normal.      ED Treatments / Results  Labs (all labs ordered are listed, but only abnormal results are displayed) Labs Reviewed  CBC - Abnormal; Notable for the following components:      Result Value   WBC 12.1 (*)    All other components within normal limits  POC OCCULT BLOOD, ED - Abnormal; Notable for the following components:   Fecal Occult Bld POSITIVE (*)    All other components within normal limits  GASTROINTESTINAL PANEL BY PCR, STOOL (REPLACES STOOL CULTURE)  C DIFFICILE QUICK SCREEN W PCR REFLEX  COMPREHENSIVE METABOLIC PANEL  LIPASE, BLOOD  I-STAT BETA HCG BLOOD, ED ([facility], [facility], [facility] ONLY)  TYPE AND SCREEN    EKG None  Radiology Ct Abdomen Pelvis W Contrast  Result Date: 09/18/2019 CLINICAL DATA:  Patient with nonlocalized abdominal pain. Blood in stool. EXAM: CT ABDOMEN AND PELVIS WITH CONTRAST TECHNIQUE: Multidetector CT imaging of the abdomen and pelvis was performed using the standard protocol following bolus administration of intravenous contrast. CONTRAST:  175mL OMNIPAQUE IOHEXOL 300 MG/ML  SOLN COMPARISON:  CT abdomen pelvis 06/08/2006 FINDINGS: Lower chest: Normal heart size. Bandlike scarring within the right lower lobe. No pleural effusion. Hepatobiliary: Stable subcentimeter too small to characterize low-attenuation lesion left hepatic lobe. Liver is normal in size and contour. Gallbladder is unremarkable. Pancreas: Unremarkable Spleen: Unremarkable Adrenals/Urinary Tract: Stable thickening left adrenal gland. Normal right adrenal gland. Kidneys enhance symmetrically with contrast. There is a 6 mm stone within the proximal left ureter without upstream obstruction (image 46; series 3). Additional bilateral nephrolithiasis measuring 5 mm within the inferior pole of the left kidney and 4 mm within the superior pole of the right kidney. Urinary  bladder is unremarkable. Stomach/Bowel: Circumferential wall thickening of the descending and sigmoid colon. There is additional wall thickening of the ascending and transverse colon although to a lesser extent. Sigmoid colonic diverticulosis. No evidence for upstream bowel obstruction. Normal appendix. No free intraperitoneal air. Normal morphology of the stomach. Small hiatal hernia. Vascular/Lymphatic: Normal caliber abdominal aorta. Peripheral calcified atherosclerotic plaque. No retroperitoneal lymphadenopathy. Reproductive: Prior hysterectomy. Other: None. Musculoskeletal: Left hip arthroplasty. Lumbar spine degenerative changes. No aggressive or acute appearing osseous lesions. IMPRESSION: 1. Circumferential wall thickening of the descending and sigmoid colon most compatible with colitis. There is also wall thickening of the ascending and transverse colon although to a lesser extent. Considerations include infectious, inflammatory or ischemic etiologies. 2. There is a 6 mm stone within the proximal left ureter. No upstream hydronephrosis. 3. Additional bilateral nephrolithiasis. Electronically Signed   By: Lovey Newcomer M.D.   On: 09/18/2019 18:46    Procedures Procedures (including critical care time)  Medications Ordered in ED Medications  sodium chloride 0.9 % bolus 1,000 mL (0 mLs Intravenous Stopped 09/18/19 1938)  dicyclomine (BENTYL) injection 20 mg (20 mg Intramuscular Given 09/18/19 1801)  ondansetron (ZOFRAN) injection 4 mg (4 mg Intravenous Given 09/18/19 1800)  iohexol (OMNIPAQUE) 300 MG/ML solution 125 mL (125 mLs Intravenous Contrast Given 09/18/19 1822)  ciprofloxacin (CIPRO) tablet 500 mg (500 mg Oral Given 09/18/19 1938)  metroNIDAZOLE (FLAGYL) tablet 500 mg (500 mg Oral Given 09/18/19 1938)  Initial Impression / Assessment and Plan / ED Course  I have reviewed the triage vital signs and the nursing notes.  Pertinent labs & imaging results that were available during my  care of the patient were reviewed by me and considered in my medical decision making (see chart for details).  58 year old female presents with 1 week of intermittent cramping abdominal pains, and blood in the stool worsened today.  No fevers, some associated vomiting today.  Mild generalized tenderness on abdominal exam, patient does not have pain out of precaution to exam to raise concern for ischemic cause for blood in the stool.  Question whether patient could have colitis versus diverticulitis.  Will get abdominal labs and CT abdomen pelvis.  Hemoccult is positive with small amount of dark blood mixed in the stool noted on exam.  We will also order stool studies if patient is able to provide sample.  Labs show mild leukocytosis of 12.1, hemoglobin very reassuring at 13.3 despite blood in stool, no significant electrolyte derangements, normal renal and liver function and normal lipase.  CT scan shows circumferential wall thickening of the descending and sigmoid colon most compatible with colitis there is some mild thickening of the ascending and transverse colon as well, could be infectious, inflammatory or ischemic, I favor this being infectious, and given this is been ongoing for a week and worsening today I think it is reasonable to treat patient with antibiotics, patient started on Cipro and Flagyl, after receiving Zofran she was able to tolerate antibiotics and p.o. fluids with no further vomiting.  Incidental finding of a 6 mm kidney stone within the left proximal ureter.  No associated hydronephrosis.  Patient is not experiencing any flank pain or symptoms from this yet, denies previous history, will start patient on Flomax.  Provided information for urology follow-up and discussed return precautions.  Will have patient follow-up with GI regarding colitis.  Strict return precautions discussed.  Patient expresses understanding and agreement.  Discharged home in good condition.    Final Clinical  Impressions(s) / ED Diagnoses   Final diagnoses:  Colitis  Blood in stool  Non-intractable vomiting with nausea, unspecified vomiting type  Left ureteral stone    ED Discharge Orders         Ordered    ondansetron (ZOFRAN ODT) 4 MG disintegrating tablet     09/18/19 2011    dicyclomine (BENTYL) 20 MG tablet  2 times daily     09/18/19 2011    ciprofloxacin (CIPRO) 500 MG tablet  2 times daily     09/18/19 2011    metroNIDAZOLE (FLAGYL) 500 MG tablet  2 times daily     09/18/19 2011    tamsulosin (FLOMAX) 0.4 MG CAPS capsule  Daily     09/18/19 2020           Dartha Lodge, PA-C 09/19/19 1212    Tilden Fossa, MD 09/20/19 1256

## 2019-09-18 NOTE — ED Notes (Signed)
Patient is being discharged from the [facility] and sent to the Emergency Department via personal vehicle by self. Per Provider Debara Pickett, patient is stable but in need of higher level of care due to blood in stool . Patient is aware and  verbalizes understanding of plan of care.  Vitals:   09/18/19 1521 09/18/19 1525  BP:  122/73  Pulse: 62   Resp: 16   Temp: 98.1 F (36.7 C)   SpO2: 96%

## 2019-09-18 NOTE — ED Triage Notes (Signed)
Pt presents with generalized abdominal pain, nausea and blood in stool for over a week.

## 2019-09-18 NOTE — Discharge Instructions (Signed)
Ed for further workup

## 2019-09-18 NOTE — Discharge Instructions (Addendum)
Your CT scan shows colitis, it also shows that you have a small 6 mm kidney stone on the left side, this may start to cause some pain, but also may pass on its own.  If you began having left-sided flank pain, worsening vomiting, or fevers, return to the ED.  This kidney stone may pass on its own, you can also follow-up with urology regarding this.  Take Flomax daily to help urine passed, you can use urine strainer to collect the stone if you pass it so that the urologist can help determine what is causing this kidney stone to develop.  To treat your colitis take antibiotics twice daily as directed, use Zofran as needed for nausea and vomiting, and Bentyl as needed for abdominal cramping.  Please follow-up with your GI doctor.  Return to the ED if you have worsening abdominal pain, increasing blood in your stool, fevers, feel lightheaded or fatigued or any other new or concerning symptoms occur.

## 2019-09-18 NOTE — ED Notes (Signed)
Patient Alert and oriented to baseline. Stable and ambulatory to baseline. Patient verbalized understanding of the discharge instructions.  Patient belongings were taken by the patient.   

## 2019-09-19 LAB — OCCULT BLOOD, POC DEVICE: Fecal Occult Bld: POSITIVE — AB

## 2019-09-19 LAB — POC OCCULT BLOOD, ED: Fecal Occult Bld: POSITIVE — AB

## 2019-10-25 ENCOUNTER — Other Ambulatory Visit: Payer: Self-pay | Admitting: Family Medicine

## 2019-10-25 DIAGNOSIS — M25571 Pain in right ankle and joints of right foot: Secondary | ICD-10-CM

## 2019-10-25 NOTE — Telephone Encounter (Signed)
[facility] is requesting to fill pt diclofenac. Please advise KH 

## 2019-12-08 ENCOUNTER — Other Ambulatory Visit: Payer: Self-pay | Admitting: Family Medicine

## 2019-12-08 DIAGNOSIS — M25571 Pain in right ankle and joints of right foot: Secondary | ICD-10-CM

## 2019-12-08 NOTE — Telephone Encounter (Signed)
Is this okay to refill? 

## 2019-12-13 ENCOUNTER — Other Ambulatory Visit: Payer: Self-pay | Admitting: Family Medicine

## 2019-12-13 DIAGNOSIS — G2581 Restless legs syndrome: Secondary | ICD-10-CM

## 2019-12-13 NOTE — Telephone Encounter (Signed)
[facility] is requesting to fill pt requip. Please advise [facility]

## 2020-01-20 ENCOUNTER — Other Ambulatory Visit: Payer: Self-pay | Admitting: Family Medicine

## 2020-01-20 DIAGNOSIS — I10 Essential (primary) hypertension: Secondary | ICD-10-CM

## 2020-01-28 ENCOUNTER — Other Ambulatory Visit: Payer: Self-pay | Admitting: Family Medicine

## 2020-01-28 DIAGNOSIS — M25572 Pain in left ankle and joints of left foot: Secondary | ICD-10-CM

## 2020-01-28 DIAGNOSIS — M25571 Pain in right ankle and joints of right foot: Secondary | ICD-10-CM

## 2020-01-30 NOTE — Telephone Encounter (Signed)
[facility] is requesting to fill pt diclofenac. Please advise KH 

## 2020-03-22 ENCOUNTER — Encounter: Payer: Self-pay | Admitting: *Deleted

## 2020-03-23 ENCOUNTER — Other Ambulatory Visit: Payer: Self-pay

## 2020-03-23 ENCOUNTER — Ambulatory Visit (INDEPENDENT_AMBULATORY_CARE_PROVIDER_SITE_OTHER): Payer: Managed Care, Other (non HMO)

## 2020-03-23 ENCOUNTER — Other Ambulatory Visit: Payer: Self-pay | Admitting: Podiatry

## 2020-03-23 ENCOUNTER — Ambulatory Visit (INDEPENDENT_AMBULATORY_CARE_PROVIDER_SITE_OTHER): Payer: Managed Care, Other (non HMO) | Admitting: Podiatry

## 2020-03-23 DIAGNOSIS — M25571 Pain in right ankle and joints of right foot: Secondary | ICD-10-CM

## 2020-03-23 DIAGNOSIS — M2141 Flat foot [pes planus] (acquired), right foot: Secondary | ICD-10-CM

## 2020-03-23 DIAGNOSIS — M25572 Pain in left ankle and joints of left foot: Secondary | ICD-10-CM | POA: Diagnosis not present

## 2020-03-23 DIAGNOSIS — M76829 Posterior tibial tendinitis, unspecified leg: Secondary | ICD-10-CM

## 2020-03-23 DIAGNOSIS — M2142 Flat foot [pes planus] (acquired), left foot: Secondary | ICD-10-CM

## 2020-03-23 NOTE — Progress Notes (Signed)
  Subjective:  Patient ID: Lisa [name], female    DOB: 03/26/1961,  MRN: [id number]  Chief Complaint  Patient presents with  . Ankle Pain    Bilateral generalized ankle pain and swelling 1 year duration, no known injuries. Pt states the pain is sharp in quality.    59 y.o. female presents with the above complaint. History confirmed with patient.   Objective:  Physical Exam: warm, good capillary refill, no trophic changes or ulcerative lesions, normal DP and PT pulses and normal sensory exam. Left Foot: pes planus, pain at PT tendon, sinus tarsi.  Right Foot: pes planus, pain at PT tendon, sinus tarsi. No images are attached to the encounter.  Radiographs: X-ray of both feet: pes planus R>L. Hindfoot degenerative changes L>R. No acute fractures. Assessment:   1. PTTD (posterior tibial tendon dysfunction)   2. Pes planus of both feet   3. Sinus tarsi syndrome of both ankles      Plan:  Patient was evaluated and treated and all questions answered.  Sinus Tarsitis and Pes Planus -X-rays reviewed as above -Dispensed Tri-Lock ankle brace.  Patient educated on use -Injection delivered to the sinus tarsi  Procedure: Injection Intermediate Joint Consent: Verbal consent obtained. Location: Bilateral sinus tarsi. Skin Prep: alcohol. Injectate: 1 cc 0.5% marcaine plain, 1 cc dexamethasone phosphate, 0.5 cc kenalog 10. Disposition: Patient tolerated procedure well. Injection site dressed with a band-aid.   Return in about 4 weeks (around 04/20/2020).

## 2020-04-02 ENCOUNTER — Telehealth: Payer: Self-pay | Admitting: *Deleted

## 2020-04-02 NOTE — Telephone Encounter (Signed)
I put forms in your folder.

## 2020-04-02 NOTE — Telephone Encounter (Signed)
Lisa [name] is calling and asking for an addendum to the [facility] paperwork. Her job is requiring it. 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 I did ask her where she was in this process. She told me she was given a list by the social worker, she thought she had someone coming today but they are not coming. I asked who they were and with what company-she said she couldn't remember and she just needed to start over. She also said she just needed some time to sit down and call some of these places but right now she is more concerned with covering herself because she is danger of losing her job.

## 2020-04-02 NOTE — Telephone Encounter (Signed)
I will submit an addendum.  Need forms

## 2020-04-04 ENCOUNTER — Encounter: Payer: Self-pay | Admitting: *Deleted

## 2020-04-12 ENCOUNTER — Telehealth: Payer: Self-pay | Admitting: Family Medicine

## 2020-04-12 NOTE — Telephone Encounter (Signed)
Lisa [name] called back and stated that on line 5 & 7 that it needs to state per her employer: " 7 days per week lasting 1 day"  She needs this faxed to her employer on Monday 6/28.  Lisa [name] states she has obtained an independent caregiver and the caregiver could not show, or have to leave at anytime and Lisa [name] would need to leave her job.  I asked Lisa [name] how long this would needed and she said as long as her Grandmother was alive.  I have placed forms on Lisa [name]'s desk.

## 2020-04-20 ENCOUNTER — Other Ambulatory Visit: Payer: Self-pay | Admitting: Family Medicine

## 2020-04-20 DIAGNOSIS — I10 Essential (primary) hypertension: Secondary | ICD-10-CM

## 2020-04-23 ENCOUNTER — Other Ambulatory Visit: Payer: Self-pay | Admitting: Family Medicine

## 2020-04-23 DIAGNOSIS — K219 Gastro-esophageal reflux disease without esophagitis: Secondary | ICD-10-CM

## 2020-04-27 ENCOUNTER — Other Ambulatory Visit: Payer: Self-pay

## 2020-04-27 ENCOUNTER — Encounter: Payer: Self-pay | Admitting: Podiatry

## 2020-04-27 ENCOUNTER — Ambulatory Visit (INDEPENDENT_AMBULATORY_CARE_PROVIDER_SITE_OTHER): Payer: Managed Care, Other (non HMO) | Admitting: Podiatry

## 2020-04-27 VITALS — Temp 97.7°F

## 2020-04-27 DIAGNOSIS — M25571 Pain in right ankle and joints of right foot: Secondary | ICD-10-CM | POA: Diagnosis not present

## 2020-04-27 DIAGNOSIS — M19072 Primary osteoarthritis, left ankle and foot: Secondary | ICD-10-CM

## 2020-04-27 DIAGNOSIS — M2142 Flat foot [pes planus] (acquired), left foot: Secondary | ICD-10-CM

## 2020-04-27 DIAGNOSIS — M19071 Primary osteoarthritis, right ankle and foot: Secondary | ICD-10-CM | POA: Diagnosis not present

## 2020-04-27 DIAGNOSIS — M25572 Pain in left ankle and joints of left foot: Secondary | ICD-10-CM

## 2020-04-27 DIAGNOSIS — M2141 Flat foot [pes planus] (acquired), right foot: Secondary | ICD-10-CM

## 2020-04-27 MED ORDER — DICLOFENAC SODIUM 1 % EX GEL: 4.0000 g | Freq: Four times a day (QID) | CUTANEOUS | 2 refills | Status: AC

## 2020-04-27 NOTE — Progress Notes (Signed)
°  Subjective:  Patient ID: Lisa [name], female    DOB: 1961-01-22,  MRN: [id number]  Chief Complaint  Patient presents with   Foot and Ankle Pain    Follow-up; Bilateral; pt stated, "My Right is doing better; Left Ankle is still hurting; trilock brace is helping; injection helped only for my right foot"    59 y.o. female presents with the above complaint. History confirmed with patient.   Objective:  Physical Exam: warm, good capillary refill, no trophic changes or ulcerative lesions, normal DP and PT pulses and normal sensory exam.  Bilateral pes planovalgus with limited ankle and subtalar joint range of motion with pain throughout the end range of motion.  Pain over the sinus tarsi. Assessment:   1. Pes planus of both feet   2. Sinus tarsi syndrome of both ankles   3. Osteoarthritis of left ankle and foot   4. Osteoarthritis of right ankle and foot      Plan:  Patient was evaluated and treated and all questions answered.  -Discussed the etiology and treatment options of adults pes planus as well as hindfoot arthritis, which is evident on her most recent x-rays.  Discussed injection therapy which is helped with right side but not for the left we can repeat her injections in the ankle and sinus tarsi at her next visit in 1 month.  I also discussed surgical correction of these with her, and which in her case would likely consist of double or triple arthrodesis given her significant arthritis and limited range of motion.  She would prefer to avoid this at this time which I think is reasonable.  I told her pes planus for me with a ankle-foot orthosis.  I recommend that she see our pedorthist Ria Clock in the next month to be evaluated and fitted for a [place] supramalleolar orthosis that she can wear her shoes. -Also recommended Voltaren gel for her to take for pain control as needed.  She cannot take oral anti-inflammatories due to her history of gastric reflux disease.  Sharl Ma, DPM 04/27/2020      Return in about 1 month (around 05/28/2020) for follow up of flat feet and ankle pain; see Raiford Noble to have [facility] brace made.

## 2020-04-27 NOTE — Patient Instructions (Signed)
Arthritis Arthritis means joint pain. It can also mean joint disease. A joint is a place where bones come together. There are more than 100 types of arthritis. What are the causes? This condition may be caused by:  Wear and tear of a joint. This is the most common cause.  A lot of acid in the blood, which leads to pain in the joint (gout).  Pain and swelling (inflammation) in a joint.  Infection of a joint.  Injuries in the joint.  A reaction to medicines (allergy). In some cases, the cause may not be known. What are the signs or symptoms? Symptoms of this condition include:  Redness at a joint.  Swelling at a joint.  Stiffness at a joint.  Warmth coming from the joint.  A fever.  A feeling of being sick. How is this treated? This condition may be treated with:  Treating the cause, if it is known.  Rest.  Raising (elevating) the joint.  Putting cold or hot packs on the joint.  Medicines to treat symptoms and reduce pain and swelling.  Shots of medicines (cortisone) into the joint. You may also be told to make changes in your life, such as doing exercises and losing weight. Follow these instructions at home: Medicines  Take over-the-counter and prescription medicines only as told by your doctor.  Do not take aspirin for pain if your doctor says that you may have gout. Activity  Rest your joint if your doctor tells you to.  Avoid activities that make the pain worse.  Exercise your joint regularly as told by your doctor. Try doing exercises like: ? Swimming. ? Water aerobics. ? Biking. ? Walking. Managing pain, stiffness, and swelling      If told, put ice on the affected area. ? Put ice in a plastic bag. ? Place a towel between your skin and the bag. ? Leave the ice on for 20 minutes, 2-3 times per day.  If your joint is swollen, raise (elevate) it above the level of your heart if told by your doctor.  If your joint feels stiff in the morning,  try taking a warm shower.  If told, put heat on the affected area. Do this as often as told by your doctor. Use the heat source that your doctor recommends, such as a moist heat pack or a heating pad. If you have diabetes, do not apply heat without asking your doctor. To apply heat: ? Place a towel between your skin and the heat source. ? Leave the heat on for 20-30 minutes. ? Remove the heat if your skin turns bright red. This is very important if you are unable to feel pain, heat, or cold. You may have a greater risk of getting burned. General instructions  Do not use any products that contain nicotine or tobacco, such as cigarettes, e-cigarettes, and chewing tobacco. If you need help quitting, ask your doctor.  Keep all follow-up visits as told by your doctor. This is important. Contact a doctor if:  The pain gets worse.  You have a fever. Get help right away if:  You have very bad pain in your joint.  You have swelling in your joint.  Your joint is red.  Many joints become painful and swollen.  You have very bad back pain.  Your leg is very weak.  You cannot control your pee (urine) or poop (stool). Summary  Arthritis means joint pain. It can also mean joint disease. A joint is a place   where bones come together.  The most common cause of this condition is wear and tear of a joint.  Symptoms of this condition include redness, swelling, or stiffness of the joint.  This condition is treated with rest, raising the joint, medicines, and putting cold or hot packs on the joint.  Follow your doctor's instructions about medicines, activity, exercises, and other home care treatments. This information is not intended to replace advice given to you by your health care provider. Make sure you discuss any questions you have with your health care provider. Document Revised: 09/13/2018 Document Reviewed: 09/13/2018 Elsevier Patient Education  2020 [employer]  Flat Feet,  Adult  Normally, a foot has a curve, called an arch, on its inner side. The arch creates a gap between the foot and the ground. Flat feet is a common condition in which one or both feet do not have an arch. What are the causes? This condition may be caused by:  Failure of a normal arch to develop during childhood.  An injury to tendons and ligaments in the foot, such as to the tendon that supports the arch (posterior tibial tendon).  Loose tendons or ligaments in the foot.  A wearing down of the arch over time.  Injury to bones in the foot.  An abnormality in the bones of the foot, called tarsal coalition. This happens when two or more bones in the foot are joined together (fused) before birth. What increases the risk? This condition is more likely to develop in:  Females.  Adults age 33 or older.  People who: ? Have a family history of flat feet. ? Have a history of childhood flexible flatfoot. ? Are obese. ? Have diabetes. ? Have high blood pressure. ? Participate in high-impact sports. ? Have inflammatory arthritis. ? Have a history of broken (fractured) or dislocated bones in the foot. What are the signs or symptoms? Symptoms of this condition include:  Pain or tightness along the bottom of the foot.  Foot pain that gets worse with activity.  Swelling of the inner side of the foot.  Swelling of the ankle.  Pain on the outer side of the ankle.  Changes in the way that you walk (gait).  Pronation. This is when the foot and ankle lean inward when you are standing.  Bony bumps on the top or inner side of the foot. How is this diagnosed? This condition is diagnosed with a physical exam of your foot and ankle. Your health care provider may also:  Look at your shoes for patterns of wear on the soles.  Order imaging tests, such as X-rays, a CT scan, or an MRI.  Refer you to a health care provider who specializes in feet (podiatrist) or a physical therapist. How  is this treated? This condition may be treated with:  Stretching exercises or physical therapy. This helps to increase range of motion and relieve pain.  A shoe insert (orthotic). This helps to support the arch of your foot. Orthotics can be purchased from a store or can be custom-made by your health care provider.  Wearing shoes with appropriate arch support. This is especially important for athletes.  Medicines. These may be prescribed to relieve pain.  An ankle brace, boot, or cast. These may be used to relieve pressure on your foot. You may be given crutches if walking is painful.  Surgery. This may be done to improve the alignment of your foot. This is only needed if your posterior tibial  tendon is torn or if you have tarsal coalition. Follow these instructions at home: Activity  Do any exercises as told by your health care provider.  If an activity causes pain, avoid it or try to find another activity that does not cause pain. General instructions  Wear orthotics and appropriate shoes as told by your health care provider.  Take over-the-counter and prescription medicines only as told by your health care provider.  Wear an ankle brace, boot, or cast as told by your health care provider.  Use crutches as told by your health care provider.  Keep all follow-up visits as told by your health care provider. This is important. How is this prevented? To prevent the condition from getting worse:  Wear comfortable, supportive shoes that are appropriate for your activities.  Maintain a healthy weight.  Stay active in a way that your health care provider recommends. This will help to keep your feet flexible and strong.  Manage long-term (chronic) health conditions, such as diabetes, high blood pressure, and inflammatory arthritis.  Work with a health care provider if you have concerns about your feet or shoes. Contact a health care provider if:  You have pain in your foot or  lower leg that gets worse or does not improve with medicine.  You have pain or difficulty when walking.  You have problems with your orthotics. Summary  Flat feet is a common condition in which one or both feet do not have a curve, called an arch, on the inner side.  Your health care provider may recommend a shoe insert (orthotic) or shoes with the appropriate arch support.  Other treatments may include stretching exercises or physical therapy, medicines to relieve pain, and wearing an ankle brace, boot, or cast.  Surgery may be done if you have a tear in the tendon that supports your arch (posterior tibial tendon) or if two or more of your foot bones were joined together (fused)  before birth (tarsal coalition). This information is not intended to replace advice given to you by your health care provider. Make sure you discuss any questions you have with your health care provider. Document Revised: 01/27/2019 Document Reviewed: 12/17/2016 Elsevier Patient Education  2020 [employer].

## 2020-05-19 ENCOUNTER — Other Ambulatory Visit: Payer: Self-pay | Admitting: Family Medicine

## 2020-05-19 DIAGNOSIS — E785 Hyperlipidemia, unspecified: Secondary | ICD-10-CM

## 2020-05-24 ENCOUNTER — Other Ambulatory Visit: Payer: Managed Care, Other (non HMO) | Admitting: Orthotics

## 2020-05-29 ENCOUNTER — Other Ambulatory Visit: Payer: Managed Care, Other (non HMO)

## 2020-05-29 ENCOUNTER — Other Ambulatory Visit: Payer: Self-pay | Admitting: Cardiology

## 2020-05-29 DIAGNOSIS — Z20822 Contact with and (suspected) exposure to covid-19: Secondary | ICD-10-CM

## 2020-05-30 LAB — NOVEL CORONAVIRUS, NAA: SARS-CoV-2, NAA: NOT DETECTED

## 2020-05-30 LAB — SARS-COV-2, NAA 2 DAY TAT

## 2020-06-01 ENCOUNTER — Ambulatory Visit: Payer: Managed Care, Other (non HMO) | Admitting: Podiatry

## 2020-06-15 ENCOUNTER — Other Ambulatory Visit: Payer: Managed Care, Other (non HMO) | Admitting: Orthotics

## 2020-06-22 ENCOUNTER — Other Ambulatory Visit: Payer: Self-pay | Admitting: Family Medicine

## 2020-06-22 DIAGNOSIS — I10 Essential (primary) hypertension: Secondary | ICD-10-CM

## 2020-07-06 ENCOUNTER — Other Ambulatory Visit: Payer: Self-pay

## 2020-07-06 ENCOUNTER — Ambulatory Visit: Payer: Managed Care, Other (non HMO) | Admitting: Orthotics

## 2020-07-06 DIAGNOSIS — M2142 Flat foot [pes planus] (acquired), left foot: Secondary | ICD-10-CM

## 2020-07-06 DIAGNOSIS — M2141 Flat foot [pes planus] (acquired), right foot: Secondary | ICD-10-CM

## 2020-07-06 DIAGNOSIS — M19072 Primary osteoarthritis, left ankle and foot: Secondary | ICD-10-CM

## 2020-07-06 DIAGNOSIS — M76829 Posterior tibial tendinitis, unspecified leg: Secondary | ICD-10-CM

## 2020-07-06 DIAGNOSIS — M19071 Primary osteoarthritis, right ankle and foot: Secondary | ICD-10-CM

## 2020-07-06 NOTE — Progress Notes (Signed)
Cast today for a SMO type brace per Dr. Lilian Kapur.   Patient had discomfort both medial/lateral dorsally when trying to put her foot in subtalar neutral; so not to much correction can be done with brace (must be more accomodative).

## 2020-07-14 ENCOUNTER — Other Ambulatory Visit: Payer: Self-pay | Admitting: Family Medicine

## 2020-07-14 DIAGNOSIS — I10 Essential (primary) hypertension: Secondary | ICD-10-CM

## 2020-07-18 ENCOUNTER — Other Ambulatory Visit: Payer: Self-pay | Admitting: Family Medicine

## 2020-07-18 DIAGNOSIS — Z1231 Encounter for screening mammogram for malignant neoplasm of breast: Secondary | ICD-10-CM

## 2020-07-20 ENCOUNTER — Other Ambulatory Visit: Payer: Self-pay

## 2020-07-20 ENCOUNTER — Ambulatory Visit (INDEPENDENT_AMBULATORY_CARE_PROVIDER_SITE_OTHER): Payer: Managed Care, Other (non HMO) | Admitting: Podiatry

## 2020-07-20 DIAGNOSIS — M19072 Primary osteoarthritis, left ankle and foot: Secondary | ICD-10-CM

## 2020-07-20 DIAGNOSIS — M2142 Flat foot [pes planus] (acquired), left foot: Secondary | ICD-10-CM | POA: Diagnosis not present

## 2020-07-20 DIAGNOSIS — M2141 Flat foot [pes planus] (acquired), right foot: Secondary | ICD-10-CM | POA: Diagnosis not present

## 2020-07-20 DIAGNOSIS — M25571 Pain in right ankle and joints of right foot: Secondary | ICD-10-CM | POA: Diagnosis not present

## 2020-07-20 DIAGNOSIS — M19071 Primary osteoarthritis, right ankle and foot: Secondary | ICD-10-CM

## 2020-07-20 DIAGNOSIS — M76829 Posterior tibial tendinitis, unspecified leg: Secondary | ICD-10-CM

## 2020-07-20 DIAGNOSIS — M25572 Pain in left ankle and joints of left foot: Secondary | ICD-10-CM

## 2020-07-21 ENCOUNTER — Encounter: Payer: Self-pay | Admitting: Podiatry

## 2020-07-21 NOTE — Progress Notes (Signed)
  Subjective:  Patient ID: Lisa [name], female    DOB: 1961/04/04,  MRN: [id number]  Chief Complaint  Patient presents with  . Follow-up    Pes planus of both feet     59 y.o. female presents with the above complaint. History confirmed with patient.  The injection on the right foot helped after the last visit.  The left foot still about the same and is in pain.  She was able to see Lisa [name] for fitting of a supramalleolar foot orthosis on the left side.  He did not cast 1 for the right side as he did not have an order for this.  The injection lasted until about 2 weeks ago.  Objective:  Physical Exam: warm, good capillary refill, no trophic changes or ulcerative lesions, normal DP and PT pulses and normal sensory exam.  Bilateral pes planovalgus with limited ankle and subtalar joint range of motion with pain throughout the end range of motion in both joints.  Pain over the sinus tarsi bilaterally with left worse than right. Assessment:   1. Pes planus of both feet   2. PTTD (posterior tibial tendon dysfunction)   3. Sinus tarsi syndrome of both ankles   4. Osteoarthritis of left ankle and foot   5. Osteoarthritis of right ankle and foot      Plan:  Patient was evaluated and treated and all questions answered.  -We did again discuss her stage III adult acquired flatfoot disease.  I believe that she will benefit greatly from the [facility] brace on the left side which has been casted and is in fabrication.  I would like her to have one fabricated for the right side as well as I believe she needs these bilaterally as it should be a symmetric deformity and arthritis.  She would prefer to avoid surgical invention at this time as this would be a significant undertaking.  If she did need surgery she would likely require double or triple arthrodesis with gastrocnemius recession versus TAL, given that she is still works on her feet and her body habitus I think this would be a difficult and  prolonged rehab likely 3 to 4 months and if we can avoid this until she has retired or it is absolutely necessary and she is getting benefit from the injections and we will continue with nonsurgical therapy.  Following sterile prep with Betadine alcohol the bilateral subtalar joints were injected with 0.5 cc of Marcaine plain 1/2%, 0.5 cc 2% Xylocaine, 5 mg of Kenalog and 2 mg of dexamethasone phosphate.  She tolerated both procedures well and adhesive bandage was applied to the injection site.  Lisa [name], DPM 07/21/2020      Return in about 3 months (around 10/20/2020).

## 2020-08-03 ENCOUNTER — Other Ambulatory Visit: Payer: Managed Care, Other (non HMO) | Admitting: Orthotics

## 2020-08-09 ENCOUNTER — Ambulatory Visit (INDEPENDENT_AMBULATORY_CARE_PROVIDER_SITE_OTHER): Payer: Managed Care, Other (non HMO) | Admitting: Family Medicine

## 2020-08-09 ENCOUNTER — Other Ambulatory Visit: Payer: Self-pay

## 2020-08-09 ENCOUNTER — Encounter: Payer: Self-pay | Admitting: Family Medicine

## 2020-08-09 VITALS — BP 140/86 | HR 52 | Temp 96.9°F | Ht 67.0 in | Wt 228.0 lb

## 2020-08-09 DIAGNOSIS — Z800 Family history of malignant neoplasm of digestive organs: Secondary | ICD-10-CM

## 2020-08-09 DIAGNOSIS — Z862 Personal history of diseases of the blood and blood-forming organs and certain disorders involving the immune mechanism: Secondary | ICD-10-CM

## 2020-08-09 DIAGNOSIS — E039 Hypothyroidism, unspecified: Secondary | ICD-10-CM

## 2020-08-09 DIAGNOSIS — E785 Hyperlipidemia, unspecified: Secondary | ICD-10-CM

## 2020-08-09 DIAGNOSIS — Z0000 Encounter for general adult medical examination without abnormal findings: Secondary | ICD-10-CM

## 2020-08-09 DIAGNOSIS — R7301 Impaired fasting glucose: Secondary | ICD-10-CM

## 2020-08-09 DIAGNOSIS — R252 Cramp and spasm: Secondary | ICD-10-CM | POA: Diagnosis not present

## 2020-08-09 DIAGNOSIS — E6609 Other obesity due to excess calories: Secondary | ICD-10-CM

## 2020-08-09 DIAGNOSIS — K219 Gastro-esophageal reflux disease without esophagitis: Secondary | ICD-10-CM

## 2020-08-09 DIAGNOSIS — Z96642 Presence of left artificial hip joint: Secondary | ICD-10-CM | POA: Diagnosis not present

## 2020-08-09 DIAGNOSIS — Z23 Encounter for immunization: Secondary | ICD-10-CM

## 2020-08-09 DIAGNOSIS — J302 Other seasonal allergic rhinitis: Secondary | ICD-10-CM

## 2020-08-09 DIAGNOSIS — I10 Essential (primary) hypertension: Secondary | ICD-10-CM | POA: Diagnosis not present

## 2020-08-09 DIAGNOSIS — G2581 Restless legs syndrome: Secondary | ICD-10-CM

## 2020-08-09 DIAGNOSIS — Z6837 Body mass index (BMI) 37.0-37.9, adult: Secondary | ICD-10-CM

## 2020-08-09 LAB — POCT GLYCOSYLATED HEMOGLOBIN (HGB A1C): Hemoglobin A1C: 5.4 % (ref 4.0–5.6)

## 2020-08-09 LAB — LIPID PANEL

## 2020-08-09 MED ORDER — PANTOPRAZOLE SODIUM 40 MG PO TBEC: 40.0000 mg | DELAYED_RELEASE_TABLET | Freq: Every day | ORAL | 3 refills | Status: DC

## 2020-08-09 MED ORDER — PRAVASTATIN SODIUM 40 MG PO TABS: 40.0000 mg | ORAL_TABLET | Freq: Every day | ORAL | 3 refills | Status: DC

## 2020-08-09 MED ORDER — LEVOTHYROXINE SODIUM 88 MCG PO TABS: 88.0000 ug | ORAL_TABLET | Freq: Every day | ORAL | 3 refills | Status: DC

## 2020-08-09 MED ORDER — ROPINIROLE HCL 0.5 MG PO TABS: 0.5000 mg | ORAL_TABLET | Freq: Every day | ORAL | 3 refills | Status: DC

## 2020-08-09 MED ORDER — OLMESARTAN MEDOXOMIL-HCTZ 40-12.5 MG PO TABS: 1.0000 | ORAL_TABLET | Freq: Every day | ORAL | 3 refills | Status: DC

## 2020-08-09 NOTE — Progress Notes (Signed)
   Subjective:    Patient ID: Lisa [name], female    DOB: [birth year], 60 y.o.   MRN: [id number]  HPI She is here for complete examination.  She does have a history of RLS is also having some cramping that she is thinks is not necessarily related to this.  She does have a remote history of sarcoid but is not had any difficulty recently.  She continues on her thyroid medication.  Her weight is not well controlled.  She does not exercise regularly and has not made any major dietary habit changes.  She continues on her olmesartan/HCTZ and amlodipine and is having no difficulty with that.  She does use Protonix for her reflux on a daily basis.  There is a previous history of glucose intolerance.  She continues on pravastatin for her lipids.  Her allergies are causing no difficulty.  She is set up for routine colonoscopy due to her family history of colon cancer.  She has had a hysterectomy.  We will schedule in the near future for mammogram.  She has had a hip replacement and is doing fine.  She thinks her other hip is starting to bother her.  Family and social history as well as health maintenance and immunizations was otherwise negative.   Review of Systems  All other systems reviewed and are negative.      Objective:   Physical Exam Alert and in no distress. Tympanic membranes and canals are normal. Pharyngeal area is normal. Neck is supple without adenopathy or thyromegaly. Cardiac exam shows a regular sinus rhythm without murmurs or gallops. Lungs are clear to auscultation. Abdominal exam shows normal bowel sounds without masses or tenderness. A1c is 5.4     Assessment & Plan:  Routine general medical examination at a health care facility - Plan: CBC with Differential/Platelet, Comprehensive metabolic panel, Lipid panel  Immunization, viral disease - Plan: Pfizer SARS-COV-2 Vaccine  Need for influenza vaccination - Plan: Flu Vaccine QUAD 36+ mos IM  Leg cramps  Essential hypertension  - Plan: olmesartan-hydrochlorothiazide (BENICAR HCT) 40-12.5 MG tablet  Class 2 obesity due to excess calories without serious comorbidity with body mass index (BMI) of 37.0 to 37.9 in adult  Status post total replacement of left hip  Hypothyroidism, unspecified type - Plan: TSH, levothyroxine (SYNTHROID) 88 MCG tablet  Hyperlipidemia with target LDL less than 100 - Plan: Lipid panel, pravastatin (PRAVACHOL) 40 MG tablet  Impaired fasting glucose  History of sarcoidosis  Other seasonal allergic rhinitis  RLS (restless legs syndrome) - Plan: Ferritin, rOPINIRole (REQUIP) 0.5 MG tablet  Family history of colon cancer in father  Gastro-esophageal reflux disease without esophagitis - Plan: pantoprazole (PROTONIX) 40 MG tablet 20 minutes of something physical every day.  Cut back on carbohydrates Cut back on the Protonix to every other day and then potentially every third day. She will continue on her present medication regimen.  Her allergies are under good control.  No particular therapy for the leg cramps is 0 probably related to her RLS.  She is not having any difficulty with her hip.

## 2020-08-09 NOTE — Patient Instructions (Addendum)
20 minutes of something physical every day.  Cut back on carbohydrates Cut back on the Protonix to every other day and then potentially every third day.

## 2020-08-10 ENCOUNTER — Ambulatory Visit (INDEPENDENT_AMBULATORY_CARE_PROVIDER_SITE_OTHER): Payer: Managed Care, Other (non HMO) | Admitting: Orthotics

## 2020-08-10 DIAGNOSIS — M25571 Pain in right ankle and joints of right foot: Secondary | ICD-10-CM

## 2020-08-10 DIAGNOSIS — M25572 Pain in left ankle and joints of left foot: Secondary | ICD-10-CM

## 2020-08-10 DIAGNOSIS — M76829 Posterior tibial tendinitis, unspecified leg: Secondary | ICD-10-CM | POA: Diagnosis not present

## 2020-08-10 DIAGNOSIS — M2141 Flat foot [pes planus] (acquired), right foot: Secondary | ICD-10-CM

## 2020-08-10 DIAGNOSIS — M2142 Flat foot [pes planus] (acquired), left foot: Secondary | ICD-10-CM

## 2020-08-10 LAB — COMPREHENSIVE METABOLIC PANEL
ALT: 23 IU/L (ref 0–32)
AST: 28 IU/L (ref 0–40)
Albumin/Globulin Ratio: 1.7 (ref 1.2–2.2)
Albumin: 4.3 g/dL (ref 3.8–4.9)
Alkaline Phosphatase: 74 IU/L (ref 44–121)
BUN/Creatinine Ratio: 17 (ref 9–23)
BUN: 15 mg/dL (ref 6–24)
Bilirubin Total: 0.4 mg/dL (ref 0.0–1.2)
CO2: 23 mmol/L (ref 20–29)
Calcium: 9.4 mg/dL (ref 8.7–10.2)
Chloride: 104 mmol/L (ref 96–106)
Creatinine, Ser: 0.89 mg/dL (ref 0.57–1.00)
GFR calc Af Amer: 82 mL/min/{1.73_m2} (ref >59)
GFR calc non Af Amer: 71 mL/min/{1.73_m2} (ref >59)
Globulin, Total: 2.6 g/dL (ref 1.5–4.5)
Glucose: 88 mg/dL (ref 65–99)
Potassium: 4.2 mmol/L (ref 3.5–5.2)
Sodium: 141 mmol/L (ref 134–144)
Total Protein: 6.9 g/dL (ref 6.0–8.5)

## 2020-08-10 LAB — LIPID PANEL
Chol/HDL Ratio: 2 ratio (ref 0.0–4.4)
Cholesterol, Total: 125 mg/dL (ref 100–199)
HDL: 61 mg/dL (ref >39)
LDL Chol Calc (NIH): 50 mg/dL (ref 0–99)
Triglycerides: 67 mg/dL (ref 0–149)
VLDL Cholesterol Cal: 14 mg/dL (ref 5–40)

## 2020-08-10 LAB — CBC WITH DIFFERENTIAL/PLATELET
Basophils Absolute: 0 10*3/uL (ref 0.0–0.2)
Basos: 1 %
EOS (ABSOLUTE): 0.1 10*3/uL (ref 0.0–0.4)
Eos: 3 %
Hematocrit: 39.5 % (ref 34.0–46.6)
Hemoglobin: 13.1 g/dL (ref 11.1–15.9)
Immature Grans (Abs): 0 10*3/uL (ref 0.0–0.1)
Immature Granulocytes: 0 %
Lymphocytes Absolute: 1.8 10*3/uL (ref 0.7–3.1)
Lymphs: 34 %
MCH: 29.1 pg (ref 26.6–33.0)
MCHC: 33.2 g/dL (ref 31.5–35.7)
MCV: 88 fL (ref 79–97)
Monocytes Absolute: 0.3 10*3/uL (ref 0.1–0.9)
Monocytes: 6 %
Neutrophils Absolute: 3 10*3/uL (ref 1.4–7.0)
Neutrophils: 56 %
Platelets: 218 10*3/uL (ref 150–450)
RBC: 4.5 x10E6/uL (ref 3.77–5.28)
RDW: 13.4 % (ref 11.7–15.4)
WBC: 5.3 10*3/uL (ref 3.4–10.8)

## 2020-08-10 LAB — TSH: TSH: 2.48 u[IU]/mL (ref 0.450–4.500)

## 2020-08-10 LAB — FERRITIN: Ferritin: 69 ng/mL (ref 15–150)

## 2020-08-10 NOTE — Progress Notes (Signed)
Patient was cast today for b/l SMO per Dr. Lilian Kapur to address Pes Planus and PTTD

## 2020-08-17 ENCOUNTER — Ambulatory Visit: Payer: Managed Care, Other (non HMO)

## 2020-09-11 ENCOUNTER — Other Ambulatory Visit: Payer: Managed Care, Other (non HMO) | Admitting: Orthotics

## 2020-09-12 ENCOUNTER — Other Ambulatory Visit: Payer: Self-pay | Admitting: Family Medicine

## 2020-09-12 DIAGNOSIS — I10 Essential (primary) hypertension: Secondary | ICD-10-CM

## 2020-09-28 ENCOUNTER — Other Ambulatory Visit: Payer: Self-pay

## 2020-09-28 ENCOUNTER — Ambulatory Visit: Payer: Managed Care, Other (non HMO) | Admitting: Orthotics

## 2020-09-28 DIAGNOSIS — M2142 Flat foot [pes planus] (acquired), left foot: Secondary | ICD-10-CM

## 2020-10-05 ENCOUNTER — Ambulatory Visit: Payer: Managed Care, Other (non HMO)

## 2020-10-11 ENCOUNTER — Other Ambulatory Visit: Payer: Self-pay

## 2020-10-11 ENCOUNTER — Ambulatory Visit: Payer: Managed Care, Other (non HMO) | Admitting: Orthotics

## 2020-10-11 ENCOUNTER — Ambulatory Visit
Admission: RE | Admit: 2020-10-11 | Discharge: 2020-10-11 | Disposition: A | Discharge status: Home / Self Care | Payer: Managed Care, Other (non HMO) | Source: Ambulatory Visit | Attending: Family Medicine | Admitting: Family Medicine

## 2020-10-11 DIAGNOSIS — M2141 Flat foot [pes planus] (acquired), right foot: Secondary | ICD-10-CM

## 2020-10-11 DIAGNOSIS — Z1231 Encounter for screening mammogram for malignant neoplasm of breast: Secondary | ICD-10-CM

## 2020-10-11 DIAGNOSIS — M2142 Flat foot [pes planus] (acquired), left foot: Secondary | ICD-10-CM

## 2020-10-11 NOTE — Progress Notes (Signed)
Patient had complaint that brace wasn't fitting in any shoes; She brought in work shoes and I took out the insoles and brace it; she still wasn't very pleased b/c it wouldn't fit in her [employer]; I explained to her that she must get shoes to accommodate brace.

## 2020-10-26 ENCOUNTER — Ambulatory Visit: Payer: Managed Care, Other (non HMO) | Admitting: Podiatry

## 2020-11-16 ENCOUNTER — Ambulatory Visit: Payer: Managed Care, Other (non HMO) | Admitting: Podiatry

## 2020-11-22 ENCOUNTER — Other Ambulatory Visit: Payer: Self-pay

## 2020-11-22 ENCOUNTER — Ambulatory Visit (INDEPENDENT_AMBULATORY_CARE_PROVIDER_SITE_OTHER): Payer: Managed Care, Other (non HMO) | Admitting: Podiatry

## 2020-11-22 DIAGNOSIS — M19071 Primary osteoarthritis, right ankle and foot: Secondary | ICD-10-CM | POA: Diagnosis not present

## 2020-11-22 DIAGNOSIS — M2141 Flat foot [pes planus] (acquired), right foot: Secondary | ICD-10-CM

## 2020-11-22 DIAGNOSIS — M25571 Pain in right ankle and joints of right foot: Secondary | ICD-10-CM | POA: Diagnosis not present

## 2020-11-22 DIAGNOSIS — M25572 Pain in left ankle and joints of left foot: Secondary | ICD-10-CM | POA: Diagnosis not present

## 2020-11-22 DIAGNOSIS — M76829 Posterior tibial tendinitis, unspecified leg: Secondary | ICD-10-CM

## 2020-11-22 DIAGNOSIS — M19072 Primary osteoarthritis, left ankle and foot: Secondary | ICD-10-CM | POA: Diagnosis not present

## 2020-11-22 DIAGNOSIS — M2142 Flat foot [pes planus] (acquired), left foot: Secondary | ICD-10-CM

## 2020-11-25 ENCOUNTER — Encounter: Payer: Self-pay | Admitting: Podiatry

## 2020-11-25 DIAGNOSIS — M25571 Pain in right ankle and joints of right foot: Secondary | ICD-10-CM | POA: Diagnosis not present

## 2020-11-25 DIAGNOSIS — M25572 Pain in left ankle and joints of left foot: Secondary | ICD-10-CM | POA: Diagnosis not present

## 2020-11-25 MED ORDER — TRIAMCINOLONE ACETONIDE 40 MG/ML IJ SUSP
20.0000 mg | Freq: Once | INTRAMUSCULAR | Status: AC
  Administered 2020-11-25: 20 mg

## 2020-11-25 MED ORDER — DEXAMETHASONE SODIUM PHOSPHATE 4 MG/ML IJ SOLN
4.0000 mg | Freq: Once | INTRAMUSCULAR | Status: AC
  Administered 2020-11-25: 4 mg

## 2020-11-25 NOTE — Progress Notes (Signed)
  Subjective:  Patient ID: Lisa [name], female    DOB: 1960-12-07,  MRN: [id number]  Chief Complaint  Patient presents with  . Foot Pain    PT stated that she is still having pain with her feet and the braces she has are uncomfortable she would like to get an injection if possible    60 y.o. female returns with the above complaint. History confirmed with patient.  Today the left foot is worse.  She did receive her SMO braces and has not worn them much because the pinching of the skin and are painful  Objective:  Physical Exam: warm, good capillary refill, no trophic changes or ulcerative lesions, normal DP and PT pulses and normal sensory exam.  Bilateral pes planovalgus with limited ankle and subtalar joint range of motion with pain throughout the end range of motion in both joints.  Pain over the sinus tarsi bilaterally with left worse than right. Assessment:   1. Pes planus of both feet   2. PTTD (posterior tibial tendon dysfunction)   3. Sinus tarsi syndrome of both ankles   4. Osteoarthritis of left ankle and foot   5. Osteoarthritis of right ankle and foot      Plan:  Patient was evaluated and treated and all questions answered.  -We again discussed her stage III adult acquired flatfoot disease.  I believe that she will benefit greatly from the [facility] brace on the left side which has been casted and is in fabrication.  I still think that nonoperative intervention be the best thing for her given her age, body habitus and comorbidities.  I think these asthma braces could work, and we will have them adjusted today, she met with Velora Heckler to discuss with him we will try to address these.  If this does not work there is an option of an Engineer, site which could work and may fit into better shoes for.  Following sterile prep with Betadine alcohol the left sinus tarsi was injected with 20 mg of Kenalog and 4 mg of dexamethasone.  She tolerated the procedure well and adhesive  bandage was applied to the injection site.  Lanae Crumbly, DPM 11/25/2020      Return in about 8 weeks (around 01/17/2021).

## 2021-01-17 ENCOUNTER — Ambulatory Visit (INDEPENDENT_AMBULATORY_CARE_PROVIDER_SITE_OTHER): Payer: Managed Care, Other (non HMO) | Admitting: Podiatry

## 2021-01-17 ENCOUNTER — Other Ambulatory Visit: Payer: Self-pay

## 2021-01-17 ENCOUNTER — Encounter: Payer: Self-pay | Admitting: Podiatry

## 2021-01-17 DIAGNOSIS — M19071 Primary osteoarthritis, right ankle and foot: Secondary | ICD-10-CM

## 2021-01-17 DIAGNOSIS — M76829 Posterior tibial tendinitis, unspecified leg: Secondary | ICD-10-CM | POA: Diagnosis not present

## 2021-01-17 DIAGNOSIS — M25572 Pain in left ankle and joints of left foot: Secondary | ICD-10-CM

## 2021-01-17 DIAGNOSIS — M19072 Primary osteoarthritis, left ankle and foot: Secondary | ICD-10-CM | POA: Diagnosis not present

## 2021-01-17 DIAGNOSIS — M25571 Pain in right ankle and joints of right foot: Secondary | ICD-10-CM | POA: Diagnosis not present

## 2021-01-17 DIAGNOSIS — M2141 Flat foot [pes planus] (acquired), right foot: Secondary | ICD-10-CM | POA: Diagnosis not present

## 2021-01-17 DIAGNOSIS — M2142 Flat foot [pes planus] (acquired), left foot: Secondary | ICD-10-CM

## 2021-01-17 NOTE — Progress Notes (Signed)
  Subjective:  Patient ID: Lisa [name], female    DOB: 10-22-1960,  MRN: [id number]  Chief Complaint  Patient presents with  . Flat Foot    PT stated that she is doing okay but she is having some pain     61 y.o. female returns with the above complaint. History confirmed with patient.  Doing somewhat better.  The injections have helped quite a bit with the pain on the outside of the ankle and foot.  Most of the pain is on the inside today  Objective:  Physical Exam: warm, good capillary refill, no trophic changes or ulcerative lesions, normal DP and PT pulses and normal sensory exam.  Bilateral pes planovalgus with limited ankle and subtalar joint range of motion with pain throughout the end range of motion in both joints.  No pain over sinus tarsi today.  Pain over the PT tendon Assessment:   1. Pes planus of both feet   2. PTTD (posterior tibial tendon dysfunction)   3. Sinus tarsi syndrome of both ankles   4. Osteoarthritis of left ankle and foot   5. Osteoarthritis of right ankle and foot      Plan:  Patient was evaluated and treated and all questions answered.  -No injection necessary today, the sinus tarsi and ankle joints were quite comfortable.  She will return for these as needed  -I inspected her SMO braces today which we had again remolded.  They still have some area of discomfort, I will work on heat molding these to alleviate the areas of pressure and add a pad at the distal edge of the sulcus of it does not rub on her feet.  Sharl Ma, DPM 01/17/2021      No follow-ups on file.

## 2021-01-24 ENCOUNTER — Ambulatory Visit (INDEPENDENT_AMBULATORY_CARE_PROVIDER_SITE_OTHER): Payer: Managed Care, Other (non HMO)

## 2021-01-24 ENCOUNTER — Other Ambulatory Visit: Payer: Self-pay

## 2021-01-24 DIAGNOSIS — Z23 Encounter for immunization: Secondary | ICD-10-CM | POA: Diagnosis not present

## 2021-03-01 ENCOUNTER — Telehealth: Payer: Self-pay | Admitting: Podiatry

## 2021-03-01 NOTE — Telephone Encounter (Signed)
Pt came into the office with her mom and was asking about the braces/orthotics  that were to be adjusted. Please advise.

## 2021-03-24 ENCOUNTER — Other Ambulatory Visit: Payer: Self-pay | Admitting: Family Medicine

## 2021-03-24 DIAGNOSIS — I10 Essential (primary) hypertension: Secondary | ICD-10-CM

## 2021-04-04 NOTE — Progress Notes (Signed)
EJ from [facility] dispensed CMOS to patient. Pt was told to give our office a call if any problem arises.

## 2021-06-08 ENCOUNTER — Other Ambulatory Visit: Payer: Self-pay | Admitting: Family Medicine

## 2021-06-08 DIAGNOSIS — K219 Gastro-esophageal reflux disease without esophagitis: Secondary | ICD-10-CM

## 2021-06-08 DIAGNOSIS — I10 Essential (primary) hypertension: Secondary | ICD-10-CM

## 2021-06-10 ENCOUNTER — Telehealth: Payer: Self-pay | Admitting: Podiatry

## 2021-06-10 NOTE — Telephone Encounter (Signed)
Left message for pt that I have changed the appt time on 9.30 to 2pm instead of 215 because EJ will need at least a 30 minute appt with her and to call if this does not work for her.

## 2021-07-19 ENCOUNTER — Other Ambulatory Visit: Payer: Self-pay | Admitting: Family Medicine

## 2021-07-19 ENCOUNTER — Other Ambulatory Visit: Payer: Self-pay | Admitting: Internal Medicine

## 2021-07-19 ENCOUNTER — Other Ambulatory Visit: Payer: Managed Care, Other (non HMO)

## 2021-07-19 DIAGNOSIS — Z1231 Encounter for screening mammogram for malignant neoplasm of breast: Secondary | ICD-10-CM

## 2021-08-16 ENCOUNTER — Encounter: Payer: Self-pay | Admitting: Family Medicine

## 2021-08-16 ENCOUNTER — Other Ambulatory Visit: Payer: Self-pay

## 2021-08-16 ENCOUNTER — Ambulatory Visit (INDEPENDENT_AMBULATORY_CARE_PROVIDER_SITE_OTHER): Payer: Managed Care, Other (non HMO) | Admitting: Family Medicine

## 2021-08-16 ENCOUNTER — Other Ambulatory Visit: Payer: Managed Care, Other (non HMO)

## 2021-08-16 VITALS — BP 150/82 | HR 50 | Temp 96.0°F | Ht 66.0 in | Wt 232.4 lb

## 2021-08-16 DIAGNOSIS — K219 Gastro-esophageal reflux disease without esophagitis: Secondary | ICD-10-CM

## 2021-08-16 DIAGNOSIS — Z0000 Encounter for general adult medical examination without abnormal findings: Secondary | ICD-10-CM

## 2021-08-16 DIAGNOSIS — R319 Hematuria, unspecified: Secondary | ICD-10-CM

## 2021-08-16 DIAGNOSIS — Z23 Encounter for immunization: Secondary | ICD-10-CM

## 2021-08-16 DIAGNOSIS — Z862 Personal history of diseases of the blood and blood-forming organs and certain disorders involving the immune mechanism: Secondary | ICD-10-CM | POA: Diagnosis not present

## 2021-08-16 DIAGNOSIS — J302 Other seasonal allergic rhinitis: Secondary | ICD-10-CM

## 2021-08-16 DIAGNOSIS — R011 Cardiac murmur, unspecified: Secondary | ICD-10-CM | POA: Diagnosis not present

## 2021-08-16 DIAGNOSIS — Z96642 Presence of left artificial hip joint: Secondary | ICD-10-CM

## 2021-08-16 DIAGNOSIS — Z8052 Family history of malignant neoplasm of bladder: Secondary | ICD-10-CM

## 2021-08-16 DIAGNOSIS — I10 Essential (primary) hypertension: Secondary | ICD-10-CM | POA: Diagnosis not present

## 2021-08-16 DIAGNOSIS — G2581 Restless legs syndrome: Secondary | ICD-10-CM

## 2021-08-16 DIAGNOSIS — E039 Hypothyroidism, unspecified: Secondary | ICD-10-CM | POA: Diagnosis not present

## 2021-08-16 DIAGNOSIS — E785 Hyperlipidemia, unspecified: Secondary | ICD-10-CM

## 2021-08-16 DIAGNOSIS — E6609 Other obesity due to excess calories: Secondary | ICD-10-CM

## 2021-08-16 DIAGNOSIS — R7301 Impaired fasting glucose: Secondary | ICD-10-CM

## 2021-08-16 DIAGNOSIS — Z6837 Body mass index (BMI) 37.0-37.9, adult: Secondary | ICD-10-CM

## 2021-08-16 DIAGNOSIS — Z800 Family history of malignant neoplasm of digestive organs: Secondary | ICD-10-CM

## 2021-08-16 LAB — POCT URINALYSIS DIP (PROADVANTAGE DEVICE)
Bilirubin, UA: NEGATIVE
Glucose, UA: NEGATIVE mg/dL
Ketones, POC UA: NEGATIVE mg/dL
Leukocytes, UA: NEGATIVE
Nitrite, UA: NEGATIVE
Protein Ur, POC: 100 mg/dL — AB
Specific Gravity, Urine: 1.025
Urobilinogen, Ur: 0.2
pH, UA: 6 (ref 5.0–8.0)

## 2021-08-16 MED ORDER — AMLODIPINE BESYLATE 5 MG PO TABS: 5.0000 mg | ORAL_TABLET | Freq: Every day | ORAL | 3 refills | Status: DC

## 2021-08-16 MED ORDER — PRAVASTATIN SODIUM 40 MG PO TABS: 40.0000 mg | ORAL_TABLET | Freq: Every day | ORAL | 3 refills | Status: DC

## 2021-08-16 MED ORDER — OLMESARTAN MEDOXOMIL-HCTZ 40-12.5 MG PO TABS: 1.0000 | ORAL_TABLET | Freq: Every day | ORAL | 3 refills | Status: DC

## 2021-08-16 MED ORDER — ROPINIROLE HCL 0.5 MG PO TABS: 0.5000 mg | ORAL_TABLET | Freq: Every day | ORAL | 3 refills | Status: DC

## 2021-08-16 MED ORDER — LEVOTHYROXINE SODIUM 88 MCG PO TABS: 88.0000 ug | ORAL_TABLET | Freq: Every day | ORAL | 3 refills | Status: DC

## 2021-08-16 NOTE — Addendum Note (Signed)
Addended by: Ronnald Nian on: 08/16/2021 02:13 PM   Modules accepted: Orders

## 2021-08-16 NOTE — Progress Notes (Addendum)
   Subjective:    Patient ID: Lisa [name], female    DOB: July 17, 1961, 60 y.o.   MRN: [id number]  HPI She is here for a complete examination.  She admits to not taking her thyroid medication on a regular basis and gives no good reason as to why she is doing that as she is also taking her other medications regularly.  She has a history of sarcoidosis but has not seen pulmonary in quite some time.  She continues on amlodipine as well as losartan/HCTZ and having no difficulty with that.  Her reflux is giving her very little difficulty.  She does have a previous history of impaired fasting glucose.  Continues on Pravachol without difficulty.  She has had a hip replacement and has had no difficulty with that.  She uses her Requip on an as-needed basis for her RLS symptoms. She does have a family history of colon cancer and is on a 5-year cycle for colonoscopy.  Next one is 2024.  She also states that she has had several family members that have bladder cancer.  Otherwise her family and social history as well as health maintenance and immunizations was reviewed  Review of Systems  All other systems reviewed and are negative.     Objective:   Physical Exam Alert and in no distress. Tympanic membranes and canals are normal. Pharyngeal area is normal. Neck is supple without adenopathy or thyromegaly. Cardiac exam shows a regular sinus rhythm with a 2/6 SEM heard best at the right sternal border, no gallops. Lungs are clear to auscultation. Abdominal exam shows no masses or tenderness. EKG read by me shows a bradycardia with other indices being normal. Urine dipstick as well as microscopic did show red cells but no other issues.    Assessment & Plan:  Routine general medical examination at a health care facility - Plan: CBC with Differential/Platelet, Comprehensive metabolic panel, Lipid panel, POCT Urinalysis DIP (Proadvantage Device)  Need for influenza vaccination - Plan: Flu Vaccine QUAD 104mo+IM  (Fluarix, Fluzone & Alfiuria Quad PF)  Need for COVID-19 vaccine - Plan: Research officer, trade union  History of sarcoidosis  Hypothyroidism, unspecified type - Plan: TSH, levothyroxine (SYNTHROID) 88 MCG tablet  Class 2 obesity due to excess calories without serious comorbidity with body mass index (BMI) of 37.0 to 37.9 in adult - Plan: CBC with Differential/Platelet, Comprehensive metabolic panel, Lipid panel  Essential hypertension - Plan: CBC with Differential/Platelet, Comprehensive metabolic panel, olmesartan-hydrochlorothiazide (BENICAR HCT) 40-12.5 MG tablet, amLODipine (NORVASC) 5 MG tablet  Gastroesophageal reflux disease, unspecified whether esophagitis present  Impaired fasting glucose - Plan: Comprehensive metabolic panel  Hyperlipidemia with target LDL less than 100 - Plan: Lipid panel, pravastatin (PRAVACHOL) 40 MG tablet  Status post total replacement of left hip  Family history of colon cancer in father  Other seasonal allergic rhinitis  RLS (restless legs syndrome) - Plan: rOPINIRole (REQUIP) 0.5 MG tablet  Systolic murmur - Plan: EKG 12-Lead, ECHOCARDIOGRAM COMPLETE  Family history of bladder cancer - Plan: Ambulatory referral to Urology  Hematuria, unspecified type - Plan: Ambulatory referral to Urology Reinforced the need for her to stay on all of her medications and make sure she takes her thyroid medication on an empty stomach.  Also strongly encouraged her to cut back on carbohydrates and increase her physical activity to help with her weight.  Continue to treat the allergies with OTC medications.

## 2021-08-17 LAB — LIPID PANEL
Chol/HDL Ratio: 2.8 ratio (ref 0.0–4.4)
Cholesterol, Total: 152 mg/dL (ref 100–199)
HDL: 54 mg/dL (ref >39)
LDL Chol Calc (NIH): 81 mg/dL (ref 0–99)
Triglycerides: 93 mg/dL (ref 0–149)
VLDL Cholesterol Cal: 17 mg/dL (ref 5–40)

## 2021-08-17 LAB — CBC WITH DIFFERENTIAL/PLATELET
Basophils Absolute: 0 10*3/uL (ref 0.0–0.2)
Basos: 1 %
EOS (ABSOLUTE): 0.1 10*3/uL (ref 0.0–0.4)
Eos: 2 %
Hematocrit: 38.1 % (ref 34.0–46.6)
Hemoglobin: 12.7 g/dL (ref 11.1–15.9)
Immature Grans (Abs): 0 10*3/uL (ref 0.0–0.1)
Immature Granulocytes: 0 %
Lymphocytes Absolute: 1.7 10*3/uL (ref 0.7–3.1)
Lymphs: 30 %
MCH: 29.6 pg (ref 26.6–33.0)
MCHC: 33.3 g/dL (ref 31.5–35.7)
MCV: 89 fL (ref 79–97)
Monocytes Absolute: 0.4 10*3/uL (ref 0.1–0.9)
Monocytes: 7 %
Neutrophils Absolute: 3.5 10*3/uL (ref 1.4–7.0)
Neutrophils: 60 %
Platelets: 230 10*3/uL (ref 150–450)
RBC: 4.29 x10E6/uL (ref 3.77–5.28)
RDW: 13.6 % (ref 11.7–15.4)
WBC: 5.8 10*3/uL (ref 3.4–10.8)

## 2021-08-17 LAB — COMPREHENSIVE METABOLIC PANEL
ALT: 19 IU/L (ref 0–32)
AST: 22 IU/L (ref 0–40)
Albumin/Globulin Ratio: 1.7 (ref 1.2–2.2)
Albumin: 4.4 g/dL (ref 3.8–4.9)
Alkaline Phosphatase: 76 IU/L (ref 44–121)
BUN/Creatinine Ratio: 15 (ref 12–28)
BUN: 18 mg/dL (ref 8–27)
Bilirubin Total: 0.4 mg/dL (ref 0.0–1.2)
CO2: 23 mmol/L (ref 20–29)
Calcium: 9.5 mg/dL (ref 8.7–10.3)
Chloride: 104 mmol/L (ref 96–106)
Creatinine, Ser: 1.18 mg/dL — ABNORMAL HIGH (ref 0.57–1.00)
Globulin, Total: 2.6 g/dL (ref 1.5–4.5)
Glucose: 105 mg/dL — ABNORMAL HIGH (ref 70–99)
Potassium: 4.3 mmol/L (ref 3.5–5.2)
Sodium: 144 mmol/L (ref 134–144)
Total Protein: 7 g/dL (ref 6.0–8.5)
eGFR: 53 mL/min/{1.73_m2} — ABNORMAL LOW (ref >59)

## 2021-08-17 LAB — TSH: TSH: 4.19 u[IU]/mL (ref 0.450–4.500)

## 2021-08-30 ENCOUNTER — Other Ambulatory Visit: Payer: Self-pay

## 2021-08-30 ENCOUNTER — Other Ambulatory Visit: Payer: Managed Care, Other (non HMO)

## 2021-09-02 ENCOUNTER — Ambulatory Visit (HOSPITAL_COMMUNITY)
Admission: RE | Admit: 2021-09-02 | Discharge: 2021-09-02 | Disposition: A | Discharge status: Home / Self Care | Payer: Managed Care, Other (non HMO) | Source: Ambulatory Visit | Attending: Family Medicine | Admitting: Family Medicine

## 2021-09-02 ENCOUNTER — Other Ambulatory Visit: Payer: Self-pay

## 2021-09-02 DIAGNOSIS — I10 Essential (primary) hypertension: Secondary | ICD-10-CM | POA: Diagnosis not present

## 2021-09-02 DIAGNOSIS — E785 Hyperlipidemia, unspecified: Secondary | ICD-10-CM | POA: Diagnosis not present

## 2021-09-02 DIAGNOSIS — R011 Cardiac murmur, unspecified: Secondary | ICD-10-CM | POA: Diagnosis present

## 2021-09-02 LAB — ECHOCARDIOGRAM COMPLETE: S' Lateral: 2.9 cm

## 2021-09-02 NOTE — Progress Notes (Signed)
  Echocardiogram 2D Echocardiogram has been performed.  Lisa [name] 09/02/2021, 8:39 AM

## 2021-09-27 ENCOUNTER — Ambulatory Visit: Payer: Managed Care, Other (non HMO)

## 2021-09-27 ENCOUNTER — Other Ambulatory Visit: Payer: Self-pay

## 2021-09-27 DIAGNOSIS — M76829 Posterior tibial tendinitis, unspecified leg: Secondary | ICD-10-CM

## 2021-09-27 NOTE — Progress Notes (Signed)
SITUATION Reason for Visit: Dispensation and Fitting of Custom Molded Gauntlet Patient Report: Patient reports comfort in ambulation and understands all instructions.  OBJECTIVE DATA Patient History / Diagnosis:  No Change in Pathology Provided Device:  Custom Molded SMO: [employer], bilateral  Goals of Orthosis: - Improve gait - Decrease energy expenditure during the gait cycle - Improve balance - Stabilize motion at ankle and subtalar joint - Compensate for muscle weakness - Facilitate motion - Provide triplanar ankle and foot stabilization for weight bearing activities  Device Justification: - Patient is ambulatory  - Device is medically necessary as part of the overall treatment due to the patient's condition and related symptoms - It is anticipated that the patient will benefit functionally with use of the device.  - The custom device is utilized in an attempt to avoid the need for surgery and because a prefabricated device is inappropriate.  Upon gait analysis, the device appeared to be fitting well and the patient states that the device is comfortable.  ACTIONS PERFORMED Patient was fit with [employer] . Patient tolerated fitting procedure. Fit of the device is good. Patient was able to apply properly and ambulate without distress. Device function is to restrict and limit motion and provide stabilization in the ankle joint.   Goals and function of this device were explained in detail to the patient. The patient was shown how to properly apply, wear, and care for the device. It was explained that the device will fit and function best in an adjustable-closure shoe with a firm heel counter and a wide base of support. When the device was dispensed, it was suitable for the patient's condition and not substandard. No guarantees were given. Precautions were reviewed.   Written instructions, warranty information, and a copy of DMEPOS Supplier Standards were  provided. All questions answered and concerns addressed.  PLAN Patient is to follow up in one week or as necessary (PRN). Plan of care was discussed with and agreed upon by patient.

## 2021-10-08 ENCOUNTER — Other Ambulatory Visit (HOSPITAL_COMMUNITY): Payer: Managed Care, Other (non HMO)

## 2021-10-18 ENCOUNTER — Ambulatory Visit
Admission: RE | Admit: 2021-10-18 | Discharge: 2021-10-18 | Disposition: A | Discharge status: Home / Self Care | Payer: Managed Care, Other (non HMO) | Source: Ambulatory Visit | Attending: Family Medicine | Admitting: Family Medicine

## 2021-10-18 DIAGNOSIS — Z1231 Encounter for screening mammogram for malignant neoplasm of breast: Secondary | ICD-10-CM

## 2022-06-25 ENCOUNTER — Encounter: Payer: Self-pay | Admitting: Internal Medicine

## 2022-07-20 ENCOUNTER — Other Ambulatory Visit: Payer: Self-pay | Admitting: Family Medicine

## 2022-07-20 DIAGNOSIS — K219 Gastro-esophageal reflux disease without esophagitis: Secondary | ICD-10-CM

## 2022-07-25 ENCOUNTER — Other Ambulatory Visit (INDEPENDENT_AMBULATORY_CARE_PROVIDER_SITE_OTHER): Payer: Managed Care, Other (non HMO)

## 2022-07-25 DIAGNOSIS — Z23 Encounter for immunization: Secondary | ICD-10-CM

## 2022-07-29 ENCOUNTER — Encounter: Payer: Self-pay | Admitting: Internal Medicine

## 2022-08-11 ENCOUNTER — Encounter: Payer: Self-pay | Admitting: Internal Medicine

## 2022-08-26 ENCOUNTER — Other Ambulatory Visit: Payer: Self-pay | Admitting: Family Medicine

## 2022-08-26 DIAGNOSIS — E785 Hyperlipidemia, unspecified: Secondary | ICD-10-CM

## 2022-08-29 ENCOUNTER — Encounter: Payer: Managed Care, Other (non HMO) | Admitting: Family Medicine

## 2022-08-31 ENCOUNTER — Other Ambulatory Visit: Payer: Self-pay | Admitting: Family Medicine

## 2022-08-31 DIAGNOSIS — I10 Essential (primary) hypertension: Secondary | ICD-10-CM

## 2022-09-13 ENCOUNTER — Other Ambulatory Visit: Payer: Self-pay | Admitting: Family Medicine

## 2022-09-13 DIAGNOSIS — I10 Essential (primary) hypertension: Secondary | ICD-10-CM

## 2022-09-19 ENCOUNTER — Other Ambulatory Visit: Payer: Self-pay | Admitting: Family Medicine

## 2022-09-19 DIAGNOSIS — Z1231 Encounter for screening mammogram for malignant neoplasm of breast: Secondary | ICD-10-CM

## 2022-09-24 ENCOUNTER — Encounter: Payer: Self-pay | Admitting: Family Medicine

## 2022-09-24 ENCOUNTER — Other Ambulatory Visit: Payer: Self-pay | Admitting: Family Medicine

## 2022-09-24 ENCOUNTER — Ambulatory Visit: Payer: Managed Care, Other (non HMO) | Admitting: Family Medicine

## 2022-09-24 VITALS — BP 132/86 | HR 52 | Ht 65.5 in | Wt 227.4 lb

## 2022-09-24 DIAGNOSIS — Z90710 Acquired absence of both cervix and uterus: Secondary | ICD-10-CM

## 2022-09-24 DIAGNOSIS — Z6837 Body mass index (BMI) 37.0-37.9, adult: Secondary | ICD-10-CM

## 2022-09-24 DIAGNOSIS — Z800 Family history of malignant neoplasm of digestive organs: Secondary | ICD-10-CM

## 2022-09-24 DIAGNOSIS — K219 Gastro-esophageal reflux disease without esophagitis: Secondary | ICD-10-CM | POA: Diagnosis not present

## 2022-09-24 DIAGNOSIS — I10 Essential (primary) hypertension: Secondary | ICD-10-CM

## 2022-09-24 DIAGNOSIS — Z96642 Presence of left artificial hip joint: Secondary | ICD-10-CM | POA: Diagnosis not present

## 2022-09-24 DIAGNOSIS — Z862 Personal history of diseases of the blood and blood-forming organs and certain disorders involving the immune mechanism: Secondary | ICD-10-CM

## 2022-09-24 DIAGNOSIS — Z0000 Encounter for general adult medical examination without abnormal findings: Secondary | ICD-10-CM

## 2022-09-24 DIAGNOSIS — E039 Hypothyroidism, unspecified: Secondary | ICD-10-CM

## 2022-09-24 DIAGNOSIS — E6609 Other obesity due to excess calories: Secondary | ICD-10-CM

## 2022-09-24 DIAGNOSIS — J302 Other seasonal allergic rhinitis: Secondary | ICD-10-CM | POA: Diagnosis not present

## 2022-09-24 DIAGNOSIS — E785 Hyperlipidemia, unspecified: Secondary | ICD-10-CM

## 2022-09-24 DIAGNOSIS — G2581 Restless legs syndrome: Secondary | ICD-10-CM

## 2022-09-24 MED ORDER — ALBUTEROL SULFATE HFA 108 (90 BASE) MCG/ACT IN AERS: 2.0000 | INHALATION_SPRAY | Freq: Four times a day (QID) | RESPIRATORY_TRACT | 2 refills | Status: DC | PRN

## 2022-09-24 MED ORDER — AMLODIPINE BESYLATE 5 MG PO TABS: 5.0000 mg | ORAL_TABLET | Freq: Every day | ORAL | 3 refills | Status: DC

## 2022-09-24 MED ORDER — ROPINIROLE HCL 0.5 MG PO TABS: 0.5000 mg | ORAL_TABLET | Freq: Every day | ORAL | 3 refills | Status: DC

## 2022-09-24 MED ORDER — OLMESARTAN MEDOXOMIL-HCTZ 40-12.5 MG PO TABS: 1.0000 | ORAL_TABLET | Freq: Every day | ORAL | 3 refills | Status: DC

## 2022-09-24 MED ORDER — LEVOTHYROXINE SODIUM 88 MCG PO TABS: 88.0000 ug | ORAL_TABLET | Freq: Every day | ORAL | 3 refills | Status: DC

## 2022-09-24 MED ORDER — PRAVASTATIN SODIUM 40 MG PO TABS: 40.0000 mg | ORAL_TABLET | Freq: Every day | ORAL | 0 refills | Status: DC

## 2022-09-24 MED ORDER — PANTOPRAZOLE SODIUM 40 MG PO TBEC: 40.0000 mg | DELAYED_RELEASE_TABLET | Freq: Every day | ORAL | 3 refills | Status: DC

## 2022-09-24 NOTE — Progress Notes (Signed)
Complete physical exam  Patient: Lisa [name]   DOB: 04-Jun-1961   61 y.o. Female  MRN: [id number]  Subjective:    Chief Complaint  Patient presents with   Annual Exam    Here for annual physical. She is fasting.     Lisa [name] is a 61 y.o. female who presents today for a complete physical exam. She reports consuming a general diet. The patient does not participate in regular exercise at present. She generally feels fairly well. She reports sleeping poorly. She complains of intermittent right upper quadrant and rib pain over when she stretches out the symptoms go away.  She does complain of some generalized arthritic pains but especially in her feet.  There is a family history of colon cancer and she did have a colonoscopy in 2019.  She does not smoke or drink.  She has had a hysterectomy.  She continues on her blood pressure medications as well as pravastatin and Protonix.  She is using the Protonix on an as-needed basis.  Her allergies seem to be under fairly good control.  She does occasionally use albuterol.  She does have a previous history of sarcoid but apparently it has burned out. Continues on her thyroid medication without incident although she sometimes does miss a dose or 2.  Otherwise her family and social history as well as health maintenance and immunizations was reviewed  Most recent fall risk assessment:    09/24/2022    1:40 PM  Fall Risk   Falls in the past year? 0  Number falls in past yr: 0  Injury with Fall? 0  Risk for fall due to : No Fall Risks  Follow up Falls evaluation completed     Most recent depression screenings:    09/24/2022    1:40 PM 08/16/2021    8:23 AM  PHQ 2/9 Scores  PHQ - 2 Score 0 0        Patient Care Team: Ronnald Nian, MD as PCP - General (Family Medicine)   Outpatient Medications Prior to Visit  Medication Sig Note   Aspirin-Caffeine (BC FAST PAIN RELIEF PO) Take by mouth.    BLACK CURRANT SEED OIL PO Take 1  capsule by mouth daily.    cetirizine (ZYRTEC) 10 MG tablet Take 10 mg by mouth at bedtime as needed for allergies.     Cholecalciferol (VITAMIN D) 2000 units CAPS Take 1 capsule by mouth.    Cyanocobalamin (VITAMIN B-12) 2500 MCG SUBL Place 1 tablet under the tongue daily.    diclofenac Sodium (VOLTAREN) 1 % GEL Apply 4 g topically 4 (four) times daily. Rub into affected area of foot 2 to 4 times daily    famotidine (PEPCID) 20 MG tablet One at bedtime (Patient taking differently: Take 20 mg by mouth at bedtime.)    fluticasone (FLONASE) 50 MCG/ACT nasal spray Place 2 sprays into the nose daily as needed for allergies. Reported on 02/04/2016    Menthol, Topical Analgesic, (BIOFREEZE EX) Apply 1 application topically 3 (three) times daily as needed (for pain).    Multiple Vitamins-Minerals (CENTRUM SILVER) tablet Take 1 tablet by mouth daily.     [DISCONTINUED] amLODipine (NORVASC) 5 MG tablet TAKE 1 TABLET (5 MG TOTAL) BY MOUTH DAILY.    [DISCONTINUED] olmesartan-hydrochlorothiazide (BENICAR HCT) 40-12.5 MG tablet TAKE 1 TABLET BY MOUTH EVERY DAY    [DISCONTINUED] pantoprazole (PROTONIX) 40 MG tablet TAKE 1 TABLET BY MOUTH EVERY DAY    [DISCONTINUED] pravastatin (PRAVACHOL)  40 MG tablet TAKE 1 TABLET BY MOUTH EVERY DAY    [DISCONTINUED] rOPINIRole (REQUIP) 0.5 MG tablet Take 1 tablet (0.5 mg total) by mouth at bedtime.    diclofenac (VOLTAREN) 75 MG EC tablet TAKE 1 TABLET BY MOUTH TWICE A DAY (Patient not taking: No sig reported)    ondansetron (ZOFRAN ODT) 4 MG disintegrating tablet 4mg  ODT q4 hours prn nausea/vomit (Patient not taking: Reported on 08/09/2020)    zolpidem (AMBIEN) 5 MG tablet Take 1 tablet (5 mg total) by mouth at bedtime as needed for sleep. (Patient not taking: Reported on 08/09/2020)    [DISCONTINUED] albuterol (PROVENTIL HFA;VENTOLIN HFA) 108 (90 Base) MCG/ACT inhaler Inhale 2 puffs every 6 (six) hours as needed into the lungs for wheezing or shortness of breath. (Patient not  taking: Reported on 08/09/2020)    [DISCONTINUED] cycloSPORINE (RESTASIS) 0.05 % ophthalmic emulsion Place 1 drop into both eyes 2 (two) times daily.   (Patient not taking: Reported on 09/24/2022)    [DISCONTINUED] levothyroxine (SYNTHROID) 88 MCG tablet Take 1 tablet (88 mcg total) by mouth daily. (Patient not taking: Reported on 09/24/2022) 09/24/2022: Does not take mediation regularly    [DISCONTINUED] tamsulosin (FLOMAX) 0.4 MG CAPS capsule Take 1 capsule (0.4 mg total) by mouth daily. (Patient not taking: Reported on 08/09/2020)    No facility-administered medications prior to visit.    Review of Systems  All other systems reviewed and are negative.         Objective:     BP 132/86   Pulse (!) 52   Ht 5' 5.5" (1.664 m)   Wt 227 lb 6.4 oz (103.1 kg)   SpO2 95% Comment: room air  BMI 37.27 kg/m    Physical Exam  Alert and in no distress. Tympanic membranes and canals are normal. Pharyngeal area is normal. Neck is supple without adenopathy or thyromegaly. Cardiac exam shows a regular sinus rhythm without murmurs or gallops. Lungs are clear to auscultation.      Assessment & Plan:    Routine general medical examination at a health care facility - Plan: CBC with Differential/Platelet, Comprehensive metabolic panel, Lipid panel  Essential hypertension - Plan: CBC with Differential/Platelet, Comprehensive metabolic panel, olmesartan-hydrochlorothiazide (BENICAR HCT) 40-12.5 MG tablet, amLODipine (NORVASC) 5 MG tablet  Other seasonal allergic rhinitis - Plan: albuterol (VENTOLIN HFA) 108 (90 Base) MCG/ACT inhaler  Gastroesophageal reflux disease, unspecified whether esophagitis present  Status post total replacement of left hip  Class 2 obesity due to excess calories without serious comorbidity with body mass index (BMI) of 37.0 to 37.9 in adult  Hyperlipidemia with target LDL less than 100 - Plan: Lipid panel, pravastatin (PRAVACHOL) 40 MG tablet  History of  sarcoidosis  Family history of colon cancer in father  RLS (restless legs syndrome) - Plan: rOPINIRole (REQUIP) 0.5 MG tablet  Gastro-esophageal reflux disease without esophagitis - Plan: pantoprazole (PROTONIX) 40 MG tablet  Hypothyroidism, unspecified type - Plan: levothyroxine (SYNTHROID) 88 MCG tablet  History of hysterectomy Okay Immunization History  Administered Date(s) Administered   Influenza Split 08/01/2011   Influenza Whole 07/20/2008, 08/01/2009, 06/28/2010, 08/06/2012   Influenza,inj,Quad PF,6+ Mos 07/19/2013, 08/10/2015, 06/24/2016, 06/23/2017, 06/03/2018, 06/10/2019, 08/09/2020, 08/16/2021, 07/25/2022   PFIZER Comirnaty(Gray Top)Covid-19 Tri-Sucrose Vaccine 01/24/2021   PFIZER(Purple Top)SARS-COV-2 Vaccination 12/30/2019, 01/25/2020, 08/09/2020   Pfizer Covid-19 Vaccine Bivalent Booster 44yrs & up 08/16/2021   Pneumococcal Conjugate-13 01/04/2016   Pneumococcal Polysaccharide-23 03/12/2012   Td 10/20/2004   Tdap 01/02/2015    Health Maintenance  Topic Date Due  HIV Screening  Never done   Zoster Vaccines- Shingrix (1 of 2) Never done   COVID-19 Vaccine (6 - 2023-24 season) 06/20/2022   MAMMOGRAM  10/19/2023   DTaP/Tdap/Td (3 - Td or Tdap) 01/01/2025   COLONOSCOPY (Pts 45-37yrs Insurance coverage will need to be confirmed)  10/05/2028   INFLUENZA VACCINE  Completed   Hepatitis C Screening  Completed   HPV VACCINES  Aged Out    Discussed the need for her to maintain regular dosing of the Synthroid on an empty stomach with no other medications associated with that.  Continue on her present medications.  I did not discuss weight loss with her on today's visit.  She will call GI to check on when her next colonoscopy will be done.  She is to return here within the next several weeks for Shingrix and COVID.  She did not want to get it today.  Did briefly discuss the rib discomfort with her and other than stretching out, she is to keep track of if there are any other  symptoms associated with it. Problem List Items Addressed This Visit     Essential hypertension   Relevant Medications   pravastatin (PRAVACHOL) 40 MG tablet   olmesartan-hydrochlorothiazide (BENICAR HCT) 40-12.5 MG tablet   amLODipine (NORVASC) 5 MG tablet   Other Relevant Orders   CBC with Differential/Platelet   Comprehensive metabolic panel   Family history of colon cancer in father   GERD   Relevant Medications   pantoprazole (PROTONIX) 40 MG tablet   History of hysterectomy   History of sarcoidosis   Hyperlipidemia with target LDL less than 100   Relevant Medications   pravastatin (PRAVACHOL) 40 MG tablet   olmesartan-hydrochlorothiazide (BENICAR HCT) 40-12.5 MG tablet   amLODipine (NORVASC) 5 MG tablet   Other Relevant Orders   Lipid panel   Hypothyroidism   Relevant Medications   levothyroxine (SYNTHROID) 88 MCG tablet   Obesity   Other seasonal allergic rhinitis   Relevant Medications   albuterol (VENTOLIN HFA) 108 (90 Base) MCG/ACT inhaler   RLS (restless legs syndrome)   Relevant Medications   rOPINIRole (REQUIP) 0.5 MG tablet   Status post total replacement of left hip   Other Visit Diagnoses     Routine general medical examination at a health care facility    -  Primary   Relevant Orders   CBC with Differential/Platelet   Comprehensive metabolic panel   Lipid panel   Gastro-esophageal reflux disease without esophagitis       Relevant Medications   pantoprazole (PROTONIX) 40 MG tablet      No follow-ups on file.     [name], MD

## 2022-09-24 NOTE — Telephone Encounter (Signed)
Per pharmacy: THE PRESCRIBED MEDICATION IS NOT COVERED BY INSURANCE. PLEASE CONSIDER CHANGING TO ONE OF THE SUGGESTED COVERED ALTERNATIVES.

## 2022-09-25 LAB — CBC WITH DIFFERENTIAL/PLATELET
Basophils Absolute: 0 10*3/uL (ref 0.0–0.2)
Basos: 0 %
EOS (ABSOLUTE): 0.1 10*3/uL (ref 0.0–0.4)
Eos: 2 %
Hematocrit: 38.5 % (ref 34.0–46.6)
Hemoglobin: 12.8 g/dL (ref 11.1–15.9)
Immature Grans (Abs): 0 10*3/uL (ref 0.0–0.1)
Immature Granulocytes: 0 %
Lymphocytes Absolute: 1.8 10*3/uL (ref 0.7–3.1)
Lymphs: 32 %
MCH: 29.3 pg (ref 26.6–33.0)
MCHC: 33.2 g/dL (ref 31.5–35.7)
MCV: 88 fL (ref 79–97)
Monocytes Absolute: 0.4 10*3/uL (ref 0.1–0.9)
Monocytes: 7 %
Neutrophils Absolute: 3.4 10*3/uL (ref 1.4–7.0)
Neutrophils: 59 %
Platelets: 234 10*3/uL (ref 150–450)
RBC: 4.37 x10E6/uL (ref 3.77–5.28)
RDW: 13.6 % (ref 11.7–15.4)
WBC: 5.8 10*3/uL (ref 3.4–10.8)

## 2022-09-25 LAB — COMPREHENSIVE METABOLIC PANEL
ALT: 21 IU/L (ref 0–32)
AST: 25 IU/L (ref 0–40)
Albumin/Globulin Ratio: 1.8 (ref 1.2–2.2)
Albumin: 4.4 g/dL (ref 3.9–4.9)
Alkaline Phosphatase: 78 IU/L (ref 44–121)
BUN/Creatinine Ratio: 16 (ref 12–28)
BUN: 17 mg/dL (ref 8–27)
Bilirubin Total: 0.4 mg/dL (ref 0.0–1.2)
CO2: 24 mmol/L (ref 20–29)
Calcium: 9.6 mg/dL (ref 8.7–10.3)
Chloride: 105 mmol/L (ref 96–106)
Creatinine, Ser: 1.05 mg/dL — ABNORMAL HIGH (ref 0.57–1.00)
Globulin, Total: 2.5 g/dL (ref 1.5–4.5)
Glucose: 81 mg/dL (ref 70–99)
Potassium: 4.1 mmol/L (ref 3.5–5.2)
Sodium: 142 mmol/L (ref 134–144)
Total Protein: 6.9 g/dL (ref 6.0–8.5)
eGFR: 60 mL/min/{1.73_m2} (ref >59)

## 2022-09-25 LAB — LIPID PANEL
Chol/HDL Ratio: 2.8 ratio (ref 0.0–4.4)
Cholesterol, Total: 142 mg/dL (ref 100–199)
HDL: 51 mg/dL (ref >39)
LDL Chol Calc (NIH): 75 mg/dL (ref 0–99)
Triglycerides: 84 mg/dL (ref 0–149)
VLDL Cholesterol Cal: 16 mg/dL (ref 5–40)

## 2022-11-07 ENCOUNTER — Encounter: Payer: Self-pay | Admitting: Medical

## 2022-11-07 ENCOUNTER — Other Ambulatory Visit (INDEPENDENT_AMBULATORY_CARE_PROVIDER_SITE_OTHER): Payer: Managed Care, Other (non HMO)

## 2022-11-07 ENCOUNTER — Telehealth (INDEPENDENT_AMBULATORY_CARE_PROVIDER_SITE_OTHER): Payer: Managed Care, Other (non HMO) | Admitting: Medical

## 2022-11-07 VITALS — Ht 65.5 in | Wt 235.0 lb

## 2022-11-07 DIAGNOSIS — R195 Other fecal abnormalities: Secondary | ICD-10-CM

## 2022-11-07 DIAGNOSIS — R197 Diarrhea, unspecified: Secondary | ICD-10-CM

## 2022-11-07 DIAGNOSIS — J029 Acute pharyngitis, unspecified: Secondary | ICD-10-CM

## 2022-11-07 DIAGNOSIS — R112 Nausea with vomiting, unspecified: Secondary | ICD-10-CM

## 2022-11-07 DIAGNOSIS — R051 Acute cough: Secondary | ICD-10-CM | POA: Diagnosis not present

## 2022-11-07 DIAGNOSIS — R059 Cough, unspecified: Secondary | ICD-10-CM | POA: Diagnosis not present

## 2022-11-07 DIAGNOSIS — R1110 Vomiting, unspecified: Secondary | ICD-10-CM

## 2022-11-07 LAB — POCT INFLUENZA A/B
Influenza A, POC: NEGATIVE
Influenza B, POC: NEGATIVE

## 2022-11-07 LAB — POC COVID19 BINAXNOW: SARS Coronavirus 2 Ag: POSITIVE — AB

## 2022-11-07 MED ORDER — PROMETHAZINE-DM 6.25-15 MG/5ML PO SYRP: 5.0000 mL | ORAL_SOLUTION | Freq: Four times a day (QID) | ORAL | 0 refills | Status: AC | PRN

## 2022-11-07 MED ORDER — MOLNUPIRAVIR EUA 200MG CAPSULE: 4.0000 | ORAL_CAPSULE | Freq: Two times a day (BID) | ORAL | 0 refills | Status: AC

## 2022-11-07 NOTE — Progress Notes (Signed)
Subjective:     Patient ID: Lisa [name], female   DOB: 1961/08/09, 62 y.o.   MRN: [id number]  This visit type was conducted due to national recommendations for restrictions regarding the COVID-19 Pandemic (e.g. social distancing) in an effort to limit this patient's exposure and mitigate transmission in our community.  Due to their co-morbid illnesses, this patient is at least at moderate risk for complications without adequate follow up.  This format is felt to be most appropriate for this patient at this time.    Documentation for virtual audio and video telecommunications through [place] encounter:  The patient was located at home. The provider was located in the office. The patient did consent to this visit and is aware of possible charges through their insurance for this visit.  The other persons participating in this telemedicine service were none. Time spent on call was 20 minutes and in review of previous records 20 minutes total.  This virtual service is not related to other E/M service within previous 7 days.   HPI Chief Complaint  Patient presents with   Emesis    VIRTUAL vomiting and diarrhea started Wed. And then cough in the middle of the night Wed. Vomited again last night. Left ear now hurts. Lots of coughing and ST(started this am). Has not taken any home covid tests.    Virtual for illness.  She notes about 3 day hx/o vomiting, some diarrhea, cough, sore throat.   All started with a cough.  Tired.  Has had some body aches, chills, weak, but no fever.  Gets up for a little while then has to go back and lie down.   No sick contacts.  Using tylenol, emergenC, dayquil, and toddy with moonshine any honey and lemon.   Vomited this morning, few different times.  No other aggravating or relieving factors. No other complaint.  Review of Systems As in subjective    Objective:   Physical Exam Due to coronavirus pandemic stay at home measures, patient visit was virtual  and they were not examined in person.   Ht 5' 5.5" (1.664 m)   Wt 235 lb (106.6 kg)   BMI 38.51 kg/m   Gen: wd, wn, nad Somewhat ill appearing Answers questions appropriately, no labored breathing or wheezing      Assessment:     Encounter Diagnoses  Name Primary?   Acute cough Yes   Sore throat    Nausea and vomiting, unspecified vomiting type    Loose stools        Plan:     We discussed symptoms and concerns.  She will report to our back parking lot for flu and COVID testing.  Continue to rest, clear fluids, hydrate well.  Can continue Tylenol as needed, vitamins over-the-counter as she is doing.  She came to our back parking for testing and tested positive for COVID.  Recommendations given to patient:  Labs show positive for COVID.  Negative for flu   Recommendations: Rest, take it easy the next few days   Continue to hydrate with clear fluids throughout the day such as water and soup broth   I did send 2 medicines to the pharmacy, Promethazine DM cough syrup that can also help with nausea as well as antiviral molnupiravir given that she is positive for COVID.  This medication is meant to cut down on the severity of symptoms and to help prevent hospitalization/severe disease   Since she has had some loose stool and vomiting I  would recommend she cut back on her blood pressure pill called olmesartan HCT.  Just do 1/2 tablet daily for the next few days until she can hydrate back up   If not improving or much worse in the next 48 hours such as uncontrollable vomiting, dehydration, shortness of breath or really struggling then go to the emergency department.   She will probably feel better for at least 3 to 4 days, but weakness and fatigue can last for weeks   I recommend quarantine for at least 5 days or longer if still having fever sneezing or a lot of coughing at day 5  Lisa [name] was seen today for emesis.  Diagnoses and all orders for this visit:  Acute  cough  Sore throat  Nausea and vomiting, unspecified vomiting type  Loose stools  Other orders -     molnupiravir EUA (LAGEVRIO) 200 mg CAPS capsule; Take 4 capsules (800 mg total) by mouth 2 (two) times daily for 5 days. -     promethazine-dextromethorphan (PROMETHAZINE-DM) 6.25-15 MG/5ML syrup; Take 5 mLs by mouth 4 (four) times daily as needed for cough.  F/u prn

## 2022-11-07 NOTE — Progress Notes (Signed)
Labs show positive for COVID.  Negative for flu  Recommendations: Rest, take it easy the next few days  Continue to hydrate with clear fluids throughout the day such as water and soup broth  I did send 2 medicines to the pharmacy, Promethazine DM cough syrup that can also help with nausea as well as antiviral molnupiravir given that she is positive for COVID.  This medication is meant to cut down on the severity of symptoms and to help prevent hospitalization/severe disease  Since she has had some loose stool and vomiting I would recommend she cut back on her blood pressure pill called olmesartan HCT.  Just do 1/2 tablet daily for the next few days until she can hydrate back up  If not improving or much worse in the next 48 hours such as uncontrollable vomiting, dehydration, shortness of breath or really struggling then go to the emergency department.  She will probably feel better for at least 3 to 4 days, but weakness and fatigue can last for weeks  I recommend quarantine for at least 5 days or longer if still having fever sneezing or a lot of coughing at day 5

## 2022-11-21 ENCOUNTER — Ambulatory Visit: Payer: Managed Care, Other (non HMO)

## 2022-12-12 ENCOUNTER — Institutional Professional Consult (permissible substitution): Payer: Managed Care, Other (non HMO) | Admitting: Internal Medicine

## 2022-12-12 NOTE — Progress Notes (Deleted)
Subjective:    Patient ID: Lisa [name], female    DOB: 06/02/1961    MRN: [id number]   Brief patient profile:  64 yobf quit smoking 03/2005 with chronic sarcoidosis manifested mostly arthritic complaints (started maybe in retrospect in 2004) prednisone -dependent from January 2008 until 08/2012.    History of Present Illness   March 27, 2010 6 wk followup. Pt c/o cough x 3 wks. She states that it only happens at night when lies down. She states that cough is dry. She states that she has been postponing her pm dose of the symbicort 80 for when the cough occurs and this seems to help. no sob. arthritis ok on 5 mg every other day dosing. no ocular c/o or rash. rec try taper to q 3 d dosing and increase symbicort to 160 2bid    07/19/2013 f/u ov/Jonus Coble re: sarcoid f/u , not steroids since 08/2012 Chief Complaint  Patient presents with   Follow-up    Pt states having joint pain-esp in the am x 3-4 months. Her cough has improved some, but still has a 'tickle in her throat"  dry  cough maybe twice a week, typically in evening or at hs. Has not tried nsaids for arthritis "all over" much worse off lyrica rec Please remember to go to the x-ray department downstairs for your tests - we will call you with the results when they are available. Add chlortrimeton 4 mg at bedtime (over the counter) to see if the cough/tickle improves Add back lyrica as before  If not better to your satisfaction > See Tammy NP first with  with all your medications> did not return    06/30/2014 f/u ov/Yeison Sippel re: final sarcoid/ cough f/u  No cough on maint rx with  ppi and pepcd and no need saba in over a year  Takes zyrtec daily but doesn't notice any flare in rhinitis symptoms off it  Rec Only use zyrtec as needed for itching sneezing and runny nose Flu shot today > all further primary care per Dr Redmond School    12/12/2022  f/u ov/Alfons Sulkowski re: ***   maint on ***  No chief complaint on file.   Dyspnea:  *** Cough:  *** Sleeping: *** SABA use: *** 02: *** Covid status:   *** Lung cancer screening :  ***    No obvious day to day or daytime variability or assoc excess/ purulent sputum or mucus plugs or hemoptysis or cp or chest tightness, subjective wheeze or overt sinus or hb symptoms.   *** without nocturnal  or early am exacerbation  of respiratory  c/o's or need for noct saba. Also denies any obvious fluctuation of symptoms with weather or environmental changes or other aggravating or alleviating factors except as outlined above   No unusual exposure hx or h/o childhood pna/ asthma or knowledge of premature birth.  Current Allergies, Complete Past Medical History, Past Surgical History, Family History, and Social History were reviewed in [employer] record.  ROS  The following are not active complaints unless bolded Hoarseness, sore throat, dysphagia, dental problems, itching, sneezing,  nasal congestion or discharge of excess mucus or purulent secretions, ear ache,   fever, chills, sweats, unintended wt loss or wt gain, classically pleuritic or exertional cp,  orthopnea pnd or arm/hand swelling  or leg swelling, presyncope, palpitations, abdominal pain, anorexia, nausea, vomiting, diarrhea  or change in bowel habits or change in bladder habits, change in stools or change in urine, dysuria, hematuria,  rash, arthralgias, visual complaints, headache, numbness, weakness or ataxia or problems with walking or coordination,  change in mood or  memory.        No outpatient medications have been marked as taking for the 12/12/22 encounter (Appointment) with Tanda Rockers, MD.                     Objective:   Physical Exam  Wt Readings from Last 3 Encounters:  11/07/22 235 lb (106.6 kg)  09/24/22 227 lb 6.4 oz (103.1 kg)  08/16/21 232 lb 6.4 oz (105.4 kg)      Vital signs reviewed  12/12/2022  - Note at rest 02 sats  ***% on ***   General appearance:    ***       Assessment & Plan:

## 2022-12-26 ENCOUNTER — Ambulatory Visit
Admission: RE | Admit: 2022-12-26 | Discharge: 2022-12-26 | Disposition: A | Discharge status: Home / Self Care | Payer: Managed Care, Other (non HMO) | Source: Ambulatory Visit | Attending: Family Medicine | Admitting: Family Medicine

## 2022-12-26 DIAGNOSIS — Z1231 Encounter for screening mammogram for malignant neoplasm of breast: Secondary | ICD-10-CM

## 2023-01-15 ENCOUNTER — Telehealth: Payer: Self-pay | Admitting: Family Medicine

## 2023-01-15 NOTE — Telephone Encounter (Signed)
Dismissal letter in guarantor snapshot  °

## 2023-02-24 ENCOUNTER — Other Ambulatory Visit: Payer: Self-pay | Admitting: Family Medicine

## 2023-02-24 DIAGNOSIS — E785 Hyperlipidemia, unspecified: Secondary | ICD-10-CM

## 2023-04-10 ENCOUNTER — Other Ambulatory Visit: Payer: Self-pay | Admitting: Family Medicine

## 2023-04-10 DIAGNOSIS — E785 Hyperlipidemia, unspecified: Secondary | ICD-10-CM

## 2023-06-28 ENCOUNTER — Other Ambulatory Visit: Payer: Self-pay | Admitting: Family Medicine

## 2023-06-28 DIAGNOSIS — E785 Hyperlipidemia, unspecified: Secondary | ICD-10-CM

## 2023-07-30 ENCOUNTER — Other Ambulatory Visit: Payer: Self-pay | Admitting: Family Medicine

## 2023-07-30 DIAGNOSIS — E785 Hyperlipidemia, unspecified: Secondary | ICD-10-CM

## 2023-10-08 ENCOUNTER — Encounter: Payer: Managed Care, Other (non HMO) | Admitting: Family Medicine

## 2023-10-22 ENCOUNTER — Other Ambulatory Visit: Payer: Self-pay | Admitting: Family Medicine

## 2023-10-22 DIAGNOSIS — K219 Gastro-esophageal reflux disease without esophagitis: Secondary | ICD-10-CM

## 2023-10-25 ENCOUNTER — Other Ambulatory Visit: Payer: Self-pay | Admitting: Family Medicine

## 2023-10-25 DIAGNOSIS — I10 Essential (primary) hypertension: Secondary | ICD-10-CM

## 2023-10-29 LAB — HM COLONOSCOPY

## 2023-12-15 ENCOUNTER — Other Ambulatory Visit: Payer: Self-pay | Admitting: Family Medicine

## 2023-12-15 DIAGNOSIS — Z1231 Encounter for screening mammogram for malignant neoplasm of breast: Secondary | ICD-10-CM

## 2024-01-01 ENCOUNTER — Ambulatory Visit
Admission: RE | Admit: 2024-01-01 | Discharge: 2024-01-01 | Disposition: A | Discharge status: Home / Self Care | Payer: Managed Care, Other (non HMO) | Source: Ambulatory Visit | Attending: Family Medicine | Admitting: Family Medicine

## 2024-01-01 DIAGNOSIS — Z1231 Encounter for screening mammogram for malignant neoplasm of breast: Secondary | ICD-10-CM

## 2024-10-28 ENCOUNTER — Other Ambulatory Visit: Payer: Self-pay | Admitting: Family Medicine

## 2024-10-28 DIAGNOSIS — Z1231 Encounter for screening mammogram for malignant neoplasm of breast: Secondary | ICD-10-CM

## 2024-11-02 LAB — LAB REPORT - SCANNED
A1c: 5.9
Albumin, Urine POC: 1323.1
Creatinine, POC: 161.2 mg/dL
EGFR: 82.2
Microalb Creat Ratio: 821

## 2024-11-11 NOTE — Progress Notes (Unsigned)
" °  Cardiology Office Note:  .   Date:  11/11/2024  ID:  Lisa [name], DOB January 06, 1961, MRN [id number] [name], MD  [facility] Providers Cardiologist:  None   History of Present Illness: .   No chief complaint on file.   Lisa [name] is a 64 y.o. female with below history who presents for the evaluation of abnormal EKG at the request of Kahoano, Haku K, MD.  History of Present Illness               Problem List HTN HLD Sarcoidosis, lung    ROS: All other ROS reviewed and negative. Pertinent positives noted in the HPI.     Studies Reviewed: SABRA       Physical Exam:   VS:  There were no vitals taken for this visit.   Wt Readings from Last 3 Encounters:  11/07/22 235 lb (106.6 kg)  09/24/22 227 lb 6.4 oz (103.1 kg)  08/16/21 232 lb 6.4 oz (105.4 kg)    GEN: Well nourished, well developed in no acute distress NECK: No JVD; No carotid bruits CARDIAC: ***RRR, no murmurs, rubs, gallops RESPIRATORY:  Clear to auscultation without rales, wheezing or rhonchi  ABDOMEN: Soft, non-tender, non-distended EXTREMITIES:  No edema; No deformity  ASSESSMENT AND PLAN: .   Assessment and Plan                 {Are you ordering a CV Procedure (e.g. stress test, cath, DCCV, TEE, etc)?   Press F2        :[id number]}   Follow-up: No follow-ups on file.  Signed, Darryle DASEN. Barbaraann, MD, [facility]  [facility]  [address] [place], [place] [address] [phone]  8:26 AM   "

## 2024-11-14 ENCOUNTER — Ambulatory Visit: Admitting: Cardiovascular Disease

## 2024-11-20 NOTE — Progress Notes (Unsigned)
 " Cardiology Office Note:  .   Date:  11/23/2024  ID:  Lisa [name], DOB 1960-11-14, MRN [id number] PCP: Sharma Coyer, MD  [facility] Providers Cardiologist:  Lisa ONEIDA Decent, MD   History of Present Illness: .    Chief Complaint  Patient presents with   Abnormal ECG    Lisa [name] is a 64 y.o. female with below history who presents for the evaluation of abnormal EKG at the request of Kahoano, Haku K, MD.  History of Present Illness   Lisa [name] is a 64 year old female who presents for evaluation of an abnormal EKG. She was referred by a physician at [facility] for evaluation of her blood pressure and abnormal EKG.  She has a history of hypertension, with a recent blood pressure reading of 190/90 mmHg, leading to the initiation of amlodipine  10 mg and losartan  50 mg. She had not been taking her medications for two years due to her previous doctor's retirement.  She experiences occasional sharp chest pains lasting a few seconds, occurring more than once a month, sometimes during sleep. These pains do not significantly concern her. She also reports a 'fluttering' sensation in her chest.  Her medical history includes sarcoidosis, currently in remission, and she has not seen her pulmonologist in two years. She denies any history of heart attack or stroke and is not aware of having diabetes.  Her family history includes cancer, high blood pressure, and diabetes, but no heart disease. She quit smoking in 2006 after being diagnosed with sarcoidosis.  She is employed at [employer], is married, and has one child. Her husband has four children.           Problem List HTN HLD Sarcoidosis, lung    ROS: All other ROS reviewed and negative. Pertinent positives noted in the HPI.     Studies Reviewed: SABRA   EKG Interpretation Date/Time:  Wednesday November 23 2024 11:05:28 EST Ventricular Rate:  53 PR Interval:  118 QRS Duration:  78 QT  Interval:  420 QTC Calculation: 394 R Axis:   -20  Text Interpretation: Sinus bradycardia Minimal voltage criteria for LVH, may be normal variant ( R in aVL ) Nonspecific ST and T wave abnormality Confirmed by [name] Lisa [phone]) on 11/23/2024 11:06:10 AM   Physical Exam:   VS:  BP 110/70 (BP Location: Left Arm, Patient Position: Sitting, Cuff Size: Normal)   Pulse (!) 53   Ht 5' 5.5 (1.664 m)   Wt 216 lb (98 kg)   BMI 35.40 kg/m    Wt Readings from Last 3 Encounters:  11/23/24 216 lb (98 kg)  11/07/22 235 lb (106.6 kg)  09/24/22 227 lb 6.4 oz (103.1 kg)    GEN: Well nourished, well developed in no acute distress NECK: No JVD; No carotid bruits CARDIAC: RRR, no murmurs, rubs, gallops RESPIRATORY:  Clear to auscultation without rales, wheezing or rhonchi  ABDOMEN: Soft, non-tender, non-distended EXTREMITIES:  No edema; No deformity  ASSESSMENT AND PLAN: .   Assessment and Plan    Hypertension with left ventricular hypertrophy Hypertension controlled on current regimen. EKG shows LVH. Occasional non-significant chest pain. No further cardiac testing needed. - Continue amlodipine  10 mg daily. - Continue losartan  50 mg daily. - Prescribed refills for amlodipine  and losartan . - Will obtain lab results from [facility] medication for review.  Mixed hyperlipidemia Discussed importance of cholesterol management for cardiovascular health. - Prescribed pravastatin  40 mg with refills. - Encouraged follow-up with  primary care for comprehensive management.   Chest pain, non-cardiac - no further testing needed.                Follow-up: Return if symptoms worsen or fail to improve.  Signed, Lisa DASEN. Barbaraann, MD, [facility]  [facility]  [address] [place], [place] [address] [phone]  12:52 PM   "

## 2024-11-23 ENCOUNTER — Ambulatory Visit: Admitting: Cardiovascular Disease

## 2024-11-23 ENCOUNTER — Encounter: Payer: Self-pay | Admitting: Cardiovascular Disease

## 2024-11-23 VITALS — BP 110/70 | HR 53 | Ht 65.5 in | Wt 216.0 lb

## 2024-11-23 DIAGNOSIS — R9431 Abnormal electrocardiogram [ECG] [EKG]: Secondary | ICD-10-CM | POA: Diagnosis not present

## 2024-11-23 DIAGNOSIS — R072 Precordial pain: Secondary | ICD-10-CM

## 2024-11-23 DIAGNOSIS — I150 Renovascular hypertension: Secondary | ICD-10-CM | POA: Diagnosis not present

## 2024-11-23 DIAGNOSIS — E782 Mixed hyperlipidemia: Secondary | ICD-10-CM | POA: Diagnosis not present

## 2024-11-23 MED ORDER — LOSARTAN POTASSIUM 50 MG PO TABS: 50.0000 mg | ORAL_TABLET | Freq: Every day | ORAL | 2 refills | Status: AC

## 2024-11-23 MED ORDER — AMLODIPINE BESYLATE 10 MG PO TABS: 10.0000 mg | ORAL_TABLET | Freq: Every day | ORAL | 2 refills | Status: AC

## 2024-11-23 MED ORDER — PRAVASTATIN SODIUM 40 MG PO TABS: 40.0000 mg | ORAL_TABLET | Freq: Every evening | ORAL | 3 refills | Status: AC

## 2024-11-23 NOTE — Patient Instructions (Addendum)
 Medication Instructions:  RESTART Pravastatin  (Pravachol ) 40 mg. Take one (1) tablet by mouth once daily.  *If you need a refill on your cardiac medications before your next appointment, please call your pharmacy*  Lab Work: None ordered If you have labs (blood work) drawn today and your tests are completely normal, you will receive your results only by: MyChart Message (if you have MyChart) OR A paper copy in the mail If you have any lab test that is abnormal or we need to change your treatment, we will call you to review the results.  Testing/Procedures: None ordered  Follow-Up: At [facility], you and your health needs are our priority.  As part of our continuing mission to provide you with exceptional heart care, our providers are all part of one team.  This team includes your primary Cardiologist (physician) and Advanced Practice Providers or APPs (Physician Assistants and Nurse Practitioners) who all work together to provide you with the care you need, when you need it.  Your next appointment:   As Needed  Provider:   Darryle ONEIDA Decent, MD    We recommend signing up for the patient portal called MyChart.  Sign up information is provided on this After Visit Summary.  MyChart is used to connect with patients for Virtual Visits (Telemedicine).  Patients are able to view lab/test results, encounter notes, upcoming appointments, etc.  Non-urgent messages can be sent to your provider as well.   To learn more about what you can do with MyChart, go to [email or web address].

## 2025-01-06 ENCOUNTER — Ambulatory Visit

## 2025-02-08 ENCOUNTER — Encounter: Admitting: Family Medicine
